# Patient Record
Sex: Female | Born: 1990 | Race: Black or African American | Hispanic: No | Marital: Single | Smoking: Never smoker
Health system: Southern US, Community
[De-identification: ages and names within clinical notes are randomized; demographics above are authoritative.]

## PROBLEM LIST (undated history)

## (undated) DIAGNOSIS — D649 Anemia, unspecified: Secondary | ICD-10-CM

## (undated) DIAGNOSIS — R112 Nausea with vomiting, unspecified: Secondary | ICD-10-CM

## (undated) DIAGNOSIS — Z9889 Other specified postprocedural states: Secondary | ICD-10-CM

## (undated) DIAGNOSIS — F70 Mild intellectual disabilities: Secondary | ICD-10-CM

## (undated) DIAGNOSIS — F32A Depression, unspecified: Secondary | ICD-10-CM

## (undated) DIAGNOSIS — R42 Dizziness and giddiness: Secondary | ICD-10-CM

## (undated) DIAGNOSIS — B001 Herpesviral vesicular dermatitis: Secondary | ICD-10-CM

## (undated) DIAGNOSIS — F329 Major depressive disorder, single episode, unspecified: Secondary | ICD-10-CM

---
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
## 2009-03-17 DIAGNOSIS — F70 Mild intellectual disabilities: Secondary | ICD-10-CM | POA: Insufficient documentation

## 2009-12-10 ENCOUNTER — Emergency Department: Payer: Self-pay | Admitting: Unknown Physician Specialty

## 2010-07-04 DIAGNOSIS — F329 Major depressive disorder, single episode, unspecified: Secondary | ICD-10-CM | POA: Insufficient documentation

## 2011-02-16 ENCOUNTER — Inpatient Hospital Stay: Payer: Self-pay | Admitting: Psychiatry

## 2011-04-21 ENCOUNTER — Ambulatory Visit: Payer: Self-pay | Admitting: Internal Medicine

## 2014-03-20 ENCOUNTER — Emergency Department: Payer: Self-pay | Admitting: Emergency Medicine

## 2014-03-20 LAB — COMPREHENSIVE METABOLIC PANEL
ANION GAP: 7 (ref 7–16)
AST: 20 U/L (ref 15–37)
Albumin: 3.6 g/dL (ref 3.4–5.0)
Alkaline Phosphatase: 55 U/L
BUN: 13 mg/dL (ref 7–18)
Bilirubin,Total: 0.5 mg/dL (ref 0.2–1.0)
CHLORIDE: 106 mmol/L (ref 98–107)
CREATININE: 0.74 mg/dL (ref 0.60–1.30)
Calcium, Total: 8.9 mg/dL (ref 8.5–10.1)
Co2: 23 mmol/L (ref 21–32)
EGFR (African American): >60
GLUCOSE: 118 mg/dL — AB (ref 65–99)
OSMOLALITY: 273 (ref 275–301)
POTASSIUM: 4.3 mmol/L (ref 3.5–5.1)
SGPT (ALT): 15 U/L (ref 12–78)
SODIUM: 136 mmol/L (ref 136–145)
Total Protein: 7.7 g/dL (ref 6.4–8.2)

## 2014-03-20 LAB — CBC WITH DIFFERENTIAL/PLATELET
BASOS PCT: 0.3 %
Basophil #: 0 10*3/uL (ref 0.0–0.1)
EOS ABS: 0.3 10*3/uL (ref 0.0–0.7)
Eosinophil %: 2.7 %
HCT: 34.7 % — AB (ref 35.0–47.0)
HGB: 11.6 g/dL — ABNORMAL LOW (ref 12.0–16.0)
LYMPHS ABS: 3 10*3/uL (ref 1.0–3.6)
Lymphocyte %: 23.6 %
MCH: 27 pg (ref 26.0–34.0)
MCHC: 33.3 g/dL (ref 32.0–36.0)
MCV: 81 fL (ref 80–100)
Monocyte #: 0.7 x10 3/mm (ref 0.2–0.9)
Monocyte %: 5.8 %
NEUTROS ABS: 8.7 10*3/uL — AB (ref 1.4–6.5)
NEUTROS PCT: 67.6 %
PLATELETS: 313 10*3/uL (ref 150–440)
RBC: 4.29 10*6/uL (ref 3.80–5.20)
RDW: 13 % (ref 11.5–14.5)
WBC: 12.8 10*3/uL — ABNORMAL HIGH (ref 3.6–11.0)

## 2014-03-20 LAB — URINALYSIS, COMPLETE
Bilirubin,UR: NEGATIVE
Blood: NEGATIVE
GLUCOSE, UR: NEGATIVE mg/dL (ref 0–75)
KETONE: NEGATIVE
NITRITE: NEGATIVE
Ph: 5 (ref 4.5–8.0)
Protein: NEGATIVE
RBC, UR: NONE SEEN /HPF (ref 0–5)
Specific Gravity: 1.028 (ref 1.003–1.030)

## 2014-03-20 LAB — WET PREP, GENITAL

## 2014-03-20 LAB — GC/CHLAMYDIA PROBE AMP

## 2014-03-20 LAB — LIPASE, BLOOD: LIPASE: 110 U/L (ref 73–393)

## 2014-03-20 LAB — HCG, QUANTITATIVE, PREGNANCY: Beta Hcg, Quant.: 65816 m[IU]/mL — ABNORMAL HIGH

## 2014-03-22 LAB — URINE CULTURE

## 2014-03-31 DIAGNOSIS — B009 Herpesviral infection, unspecified: Secondary | ICD-10-CM | POA: Insufficient documentation

## 2015-06-20 ENCOUNTER — Emergency Department
Admission: EM | Admit: 2015-06-20 | Discharge: 2015-06-20 | Disposition: A | Discharge status: Home / Self Care | Payer: Medicare Other | Attending: Student | Admitting: Student

## 2015-06-20 ENCOUNTER — Encounter: Payer: Self-pay | Admitting: Emergency Medicine

## 2015-06-20 DIAGNOSIS — M545 Low back pain: Secondary | ICD-10-CM | POA: Diagnosis present

## 2015-06-20 DIAGNOSIS — N3001 Acute cystitis with hematuria: Secondary | ICD-10-CM | POA: Diagnosis not present

## 2015-06-20 HISTORY — DX: Mild intellectual disabilities: F70

## 2015-06-20 HISTORY — DX: Herpesviral vesicular dermatitis: B00.1

## 2015-06-20 LAB — URINALYSIS COMPLETE WITH MICROSCOPIC (ARMC ONLY)
Bilirubin Urine: NEGATIVE
Glucose, UA: NEGATIVE mg/dL
KETONES UR: NEGATIVE mg/dL
NITRITE: NEGATIVE
PH: 6 (ref 5.0–8.0)
PROTEIN: 30 mg/dL — AB
Specific Gravity, Urine: 1.018 (ref 1.005–1.030)

## 2015-06-20 MED ORDER — SULFAMETHOXAZOLE-TRIMETHOPRIM 800-160 MG PO TABS: 1.0000 | ORAL_TABLET | Freq: Two times a day (BID) | ORAL | Status: DC

## 2015-06-20 MED ORDER — DIAZEPAM 5 MG PO TABS
5.0000 mg | ORAL_TABLET | Freq: Once | ORAL | Status: AC
  Administered 2015-06-20: 5 mg via ORAL
  Filled 2015-06-20: qty 1

## 2015-06-20 MED ORDER — NAPROXEN 500 MG PO TBEC: 500.0000 mg | DELAYED_RELEASE_TABLET | Freq: Two times a day (BID) | ORAL | Status: DC

## 2015-06-20 MED ORDER — KETOROLAC TROMETHAMINE 60 MG/2ML IM SOLN
60.0000 mg | Freq: Once | INTRAMUSCULAR | Status: AC
  Administered 2015-06-20: 60 mg via INTRAMUSCULAR
  Filled 2015-06-20: qty 2

## 2015-06-20 MED ORDER — CYCLOBENZAPRINE HCL 5 MG PO TABS: 5.0000 mg | ORAL_TABLET | Freq: Three times a day (TID) | ORAL | Status: DC | PRN

## 2015-06-20 NOTE — Discharge Instructions (Signed)

## 2015-06-20 NOTE — ED Notes (Signed)
Per pt she has had lower back pain for about  3 weeks. Also states she has been on her period for "awhile" . She usually has painful periods. Min relief with ibuprofen. Also states she is having urinary freq and pain

## 2015-06-20 NOTE — ED Provider Notes (Signed)
[facility] Emergency Department Provider Note  ____________________________________________  Time seen: Approximately 4:30 PM  I have reviewed the triage vital signs and the nursing notes.   HISTORY  Chief Complaint Back Pain    HPI [name] is a 24 y.o. female who presents for evaluation of lower back pain for 3 weeks. Patient states no relief with ibuprofen. Also states she's having some urinary frequency and pain. Recently finished her menses.   Past Medical History  Diagnosis Date  . Mild mental retardation   . Herpes labialis     There are no active problems to display for this patient.   History reviewed. No pertinent past surgical history.  Current Outpatient Rx  Name  Route  Sig  Dispense  Refill  . cyclobenzaprine (FLEXERIL) 5 MG tablet   Oral   Take 1 tablet (5 mg total) by mouth every 8 (eight) hours as needed for muscle spasms.   30 tablet   0   . naproxen (EC NAPROSYN) 500 MG EC tablet   Oral   Take 1 tablet (500 mg total) by mouth 2 (two) times daily with a meal.   60 tablet   0   . sulfamethoxazole-trimethoprim (BACTRIM DS,SEPTRA DS) 800-160 MG tablet   Oral   Take 1 tablet by mouth 2 (two) times daily.   20 tablet   0     Allergies Review of patient's allergies indicates no known allergies.  History reviewed. No pertinent family history.  Social History Social History  Substance Use Topics  . Smoking status: Never Smoker   . Smokeless tobacco: None  . Alcohol Use: No    Review of Systems Constitutional: No fever/chills Eyes: No visual changes. ENT: No sore throat. Cardiovascular: Denies chest pain. Respiratory: Denies shortness of breath. Gastrointestinal: No abdominal pain.  No nausea, no vomiting.  No diarrhea.  No constipation. Genitourinary: Positive for dysuria. Musculoskeletal: Positive for low back pain Skin: Negative for rash. Neurological: Negative for headaches, focal weakness or  numbness.  10-point ROS otherwise negative.  ____________________________________________   PHYSICAL EXAM:  VITAL SIGNS: ED Triage Vitals  Enc Vitals Group     BP 06/20/15 1608 109/73 mmHg     Pulse Rate 06/20/15 1608 94     Resp 06/20/15 1608 20     Temp 06/20/15 1608 99.5 F (37.5 C)     Temp Source 06/20/15 1608 Oral     SpO2 06/20/15 1608 99 %     Weight 06/20/15 1608 149 lb (67.586 kg)     Height 06/20/15 1608  (1.626 m)     Head Cir --      Peak Flow --      Pain Score 06/20/15 1609 7     Pain Loc --      Pain Edu? --      Excl. in GC? --     Constitutional: Alert and oriented. Well appearing and in no acute distress. Eyes: Conjunctivae are normal. PERRL. EOMI. Head: Atraumatic. Nose: No congestion/rhinnorhea. Mouth/Throat: Mucous membranes are moist.  Oropharynx non-erythematous. Neck: No stridor.   Cardiovascular: Normal rate, regular rhythm. Grossly normal heart sounds.  Good peripheral circulation. Respiratory: Normal respiratory effort.  No retractions. Lungs CTAB. Gastrointestinal: Soft and nontender. No distention. No abdominal bruits. No CVA tenderness. Musculoskeletal: Positive lumbar tenderness as well as paralumbar spine tenderness. Straight leg raise negative bilaterally. Neurologic:  Normal speech and language. No gross focal neurologic deficits are appreciated. No gait instability. Skin:  Skin is warm, dry and intact. No rash noted. Psychiatric: Mood and affect are normal. Speech and behavior are normal.  ____________________________________________   LABS (all labs ordered are listed, but only abnormal results are displayed)  Labs Reviewed  URINALYSIS COMPLETEWITH MICROSCOPIC ([facility] ONLY) - Abnormal; Notable for the following:    Color, Urine YELLOW (*)    APPearance HAZY (*)    Hgb urine dipstick 2+ (*)    Protein, ur 30 (*)    Leukocytes, UA 2+ (*)    Bacteria, UA FEW (*)    Squamous Epithelial / LPF 0-5 (*)    All other components  within normal limits     PROCEDURES  Procedure(s) performed: None  Critical Care performed: No  ____________________________________________   INITIAL IMPRESSION / ASSESSMENT AND PLAN / ED COURSE  Pertinent labs & imaging results that were available during my care of the patient were reviewed by me and considered in my medical decision making (see chart for details).  Acute urinary tract infection. Rx given for Bactrim DS twice a day #20, Flexeril 5 mg 3 times a day and naproxen 500 mg twice a day as needed for back pain. Patient follow-up with PCP or return to the ER as needed. Patient voices no other emergency medical complaints at this time. ____________________________________________   FINAL CLINICAL IMPRESSION(S) / ED DIAGNOSES  Final diagnoses:  Acute cystitis with hematuria       Evangeline Dakin, PA-C 06/20/15 1810  Gayla Doss, MD 06/20/15 2034

## 2015-06-20 NOTE — ED Notes (Signed)
Brought in via [facility] from md's office   Having lower back pain and abd cramping. States pain started about 3 weeks ago .Marland Kitchennow is worse. Also has had fever today

## 2015-10-05 ENCOUNTER — Emergency Department
Admission: EM | Admit: 2015-10-05 | Discharge: 2015-10-05 | Disposition: A | Discharge status: Home / Self Care | Payer: Medicare Other | Attending: Emergency Medicine | Admitting: Emergency Medicine

## 2015-10-05 DIAGNOSIS — Z791 Long term (current) use of non-steroidal anti-inflammatories (NSAID): Secondary | ICD-10-CM | POA: Insufficient documentation

## 2015-10-05 DIAGNOSIS — R1084 Generalized abdominal pain: Secondary | ICD-10-CM | POA: Diagnosis not present

## 2015-10-05 DIAGNOSIS — Z3202 Encounter for pregnancy test, result negative: Secondary | ICD-10-CM | POA: Diagnosis not present

## 2015-10-05 DIAGNOSIS — Z792 Long term (current) use of antibiotics: Secondary | ICD-10-CM | POA: Diagnosis not present

## 2015-10-05 DIAGNOSIS — R109 Unspecified abdominal pain: Secondary | ICD-10-CM | POA: Diagnosis present

## 2015-10-05 LAB — COMPREHENSIVE METABOLIC PANEL
ALT: 13 U/L — ABNORMAL LOW (ref 14–54)
AST: 17 U/L (ref 15–41)
Albumin: 4.1 g/dL (ref 3.5–5.0)
Alkaline Phosphatase: 62 U/L (ref 38–126)
Anion gap: 8 (ref 5–15)
BUN: 20 mg/dL (ref 6–20)
CHLORIDE: 106 mmol/L (ref 101–111)
CO2: 26 mmol/L (ref 22–32)
Calcium: 9.7 mg/dL (ref 8.9–10.3)
Creatinine, Ser: 0.76 mg/dL (ref 0.44–1.00)
GFR calc Af Amer: >60 mL/min (ref >60)
Glucose, Bld: 98 mg/dL (ref 65–99)
Potassium: 4.1 mmol/L (ref 3.5–5.1)
Sodium: 140 mmol/L (ref 135–145)
Total Bilirubin: 0.6 mg/dL (ref 0.3–1.2)
Total Protein: 7.8 g/dL (ref 6.5–8.1)

## 2015-10-05 LAB — CBC WITH DIFFERENTIAL/PLATELET
BASOS ABS: 0 10*3/uL (ref 0–0.1)
BASOS PCT: 1 %
Eosinophils Absolute: 0.4 10*3/uL (ref 0–0.7)
Eosinophils Relative: 5 %
HEMATOCRIT: 36.1 % (ref 35.0–47.0)
HEMOGLOBIN: 11.8 g/dL — AB (ref 12.0–16.0)
Lymphocytes Relative: 29 %
Lymphs Abs: 2.7 10*3/uL (ref 1.0–3.6)
MCH: 26 pg (ref 26.0–34.0)
MCHC: 32.5 g/dL (ref 32.0–36.0)
MCV: 79.9 fL — ABNORMAL LOW (ref 80.0–100.0)
MONO ABS: 0.5 10*3/uL (ref 0.2–0.9)
Monocytes Relative: 6 %
NEUTROS ABS: 5.5 10*3/uL (ref 1.4–6.5)
NEUTROS PCT: 59 %
Platelets: 388 10*3/uL (ref 150–440)
RBC: 4.52 MIL/uL (ref 3.80–5.20)
RDW: 13.3 % (ref 11.5–14.5)
WBC: 9.1 10*3/uL (ref 3.6–11.0)

## 2015-10-05 LAB — URINALYSIS COMPLETE WITH MICROSCOPIC (ARMC ONLY)
BACTERIA UA: NONE SEEN
BILIRUBIN URINE: NEGATIVE
Glucose, UA: NEGATIVE mg/dL
HGB URINE DIPSTICK: NEGATIVE
Ketones, ur: NEGATIVE mg/dL
Nitrite: NEGATIVE
PH: 5 (ref 5.0–8.0)
Protein, ur: NEGATIVE mg/dL
SPECIFIC GRAVITY, URINE: 1.027 (ref 1.005–1.030)

## 2015-10-05 LAB — LIPASE, BLOOD: LIPASE: 35 U/L (ref 11–51)

## 2015-10-05 LAB — POCT PREGNANCY, URINE: PREG TEST UR: NEGATIVE

## 2015-10-05 MED ORDER — DICYCLOMINE HCL 20 MG PO TABS: 20.0000 mg | ORAL_TABLET | Freq: Three times a day (TID) | ORAL | Status: DC | PRN

## 2015-10-05 MED ORDER — SUCRALFATE 1 G PO TABS: 1.0000 g | ORAL_TABLET | Freq: Four times a day (QID) | ORAL | Status: DC

## 2015-10-05 NOTE — ED Provider Notes (Signed)
[facility] Emergency Department Provider Note    ____________________________________________  Time seen: 2200  I have reviewed the triage vital signs and the nursing notes.   HISTORY  Chief Complaint Abdominal Pain   History limited by: Not Limited   HPI ANAI LIPSON is a 25 y.o. female who presents to the emergency department because of abdominal pain. It is located throughout her abdomen. It started 2 weeks ago. It has been constant. It does somewhat wax and wane. She describes it as both cramping and sharp. It will become severe at times. She has not noticed any modifying factors, she does not nose any change with eating or defecation. The patient denies similar symptoms in the past. She denies any fevers. Denies any vomiting. Denies any diarrhea.    Past Medical History  Diagnosis Date  . Mild mental retardation   . Herpes labialis     There are no active problems to display for this patient.   No past surgical history on file.  Current Outpatient Rx  Name  Route  Sig  Dispense  Refill  . cyclobenzaprine (FLEXERIL) 5 MG tablet   Oral   Take 1 tablet (5 mg total) by mouth every 8 (eight) hours as needed for muscle spasms.   30 tablet   0   . naproxen (EC NAPROSYN) 500 MG EC tablet   Oral   Take 1 tablet (500 mg total) by mouth 2 (two) times daily with a meal.   60 tablet   0   . sulfamethoxazole-trimethoprim (BACTRIM DS,SEPTRA DS) 800-160 MG tablet   Oral   Take 1 tablet by mouth 2 (two) times daily.   20 tablet   0     Allergies Review of patient's allergies indicates no known allergies.  No family history on file.  Social History Social History  Substance Use Topics  . Smoking status: Never Smoker   . Smokeless tobacco: Not on file  . Alcohol Use: No    Review of Systems  Constitutional: Negative for fever. Cardiovascular: Negative for chest pain. Respiratory: Negative for shortness of  breath. Gastrointestinal: Positive for generalized abdominal pain. Neurological: Negative for headaches, focal weakness or numbness.   10-point ROS otherwise negative.  ____________________________________________   PHYSICAL EXAM:  VITAL SIGNS: ED Triage Vitals  Enc Vitals Group     BP 10/05/15 2101 128/85 mmHg     Pulse Rate 10/05/15 2101 85     Resp 10/05/15 2101 20     Temp 10/05/15 2101 98.7 F (37.1 C)     Temp Source 10/05/15 2101 Oral     SpO2 10/05/15 2101 100 %     Weight 10/05/15 2101 145 lb (65.772 kg)     Height 10/05/15 2101  (1.626 m)     Head Cir --      Peak Flow --      Pain Score 10/05/15 2107 9   Constitutional: Alert and oriented. Well appearing and in no distress. Eyes: Conjunctivae are normal. PERRL. Normal extraocular movements. ENT   Head: Normocephalic and atraumatic.   Nose: No congestion/rhinnorhea.   Mouth/Throat: Mucous membranes are moist.   Neck: No stridor. Hematological/Lymphatic/Immunilogical: No cervical lymphadenopathy. Cardiovascular: Normal rate, regular rhythm.  No murmurs, rubs, or gallops. Respiratory: Normal respiratory effort without tachypnea nor retractions. Breath sounds are clear and equal bilaterally. No wheezes/rales/rhonchi. Gastrointestinal: Soft and mildly tender to palpation diffusely. No rebound. No guarding. No tympany. Genitourinary: Deferred Musculoskeletal: Normal range of motion in all  extremities. No joint effusions.  No lower extremity tenderness nor edema. Neurologic:  Normal speech and language. No gross focal neurologic deficits are appreciated.  Skin:  Skin is warm, dry and intact. No rash noted. Psychiatric: Mood and affect are normal. Speech and behavior are normal. Patient exhibits appropriate insight and judgment.  ____________________________________________    LABS (pertinent positives/negatives)  Labs Reviewed  CBC WITH DIFFERENTIAL/PLATELET - Abnormal; Notable for the  following:    Hemoglobin 11.8 (*)    MCV 79.9 (*)    All other components within normal limits  COMPREHENSIVE METABOLIC PANEL - Abnormal; Notable for the following:    ALT 13 (*)    All other components within normal limits  URINALYSIS COMPLETEWITH MICROSCOPIC ([facility] ONLY) - Abnormal; Notable for the following:    Color, Urine YELLOW (*)    APPearance HAZY (*)    Leukocytes, UA 1+ (*)    Squamous Epithelial / LPF 0-5 (*)    All other components within normal limits  LIPASE, BLOOD  POCT PREGNANCY, URINE  POC URINE PREG, ED     ____________________________________________   EKG  None  ____________________________________________    RADIOLOGY  None   ____________________________________________   PROCEDURES  Procedure(s) performed: None  Critical Care performed: No  ____________________________________________   INITIAL IMPRESSION / ASSESSMENT AND PLAN / ED COURSE  Pertinent labs & imaging results that were available during my care of the patient were reviewed by me and considered in my medical decision making (see chart for details).  Patient presented to the emergency department today because of concerns for generalized abdominal pain. Blood work without any concerning results. Urine without any concerning results. On my exam patient is mildly and diffusely tender to palpation in the abdomen without any concerning signs of rebound guarding or rigidity. This point I doubt a severe intra-abdominal infection. Unclear etiology at this point although I do think patient would benefit from a trial of oral medications. I did discuss return precautions with patient.  ____________________________________________   FINAL CLINICAL IMPRESSION(S) / ED DIAGNOSES  Final diagnoses:  Generalized abdominal pain     Phineas Semen, MD 10/05/15 2251

## 2015-10-05 NOTE — Discharge Instructions (Signed)
Please seek medical attention for any high fevers, chest pain, shortness of breath, change in behavior, persistent vomiting, bloody stool or any other new or concerning symptoms. ° ° °Abdominal Pain, Adult °Many things can cause abdominal pain. Usually, abdominal pain is not caused by a disease and will improve without treatment. It can often be observed and treated at home. Your health care provider will do a physical exam and possibly order blood tests and X-rays to help determine the seriousness of your pain. However, in many cases, more time must pass before a clear cause of the pain can be found. Before that point, your health care provider may not know if you need more testing or further treatment. °HOME CARE INSTRUCTIONS °Monitor your abdominal pain for any changes. The following actions may help to alleviate any discomfort you are experiencing: °· Only take over-the-counter or prescription medicines as directed by your health care provider. °· Do not take laxatives unless directed to do so by your health care provider. °· Try a clear liquid diet (broth, tea, or water) as directed by your health care provider. Slowly move to a bland diet as tolerated. °SEEK MEDICAL CARE IF: °· You have unexplained abdominal pain. °· You have abdominal pain associated with nausea or diarrhea. °· You have pain when you urinate or have a bowel movement. °· You experience abdominal pain that wakes you in the night. °· You have abdominal pain that is worsened or improved by eating food. °· You have abdominal pain that is worsened with eating fatty foods. °· You have a fever. °SEEK IMMEDIATE MEDICAL CARE IF: °· Your pain does not go away within 2 hours. °· You keep throwing up (vomiting). °· Your pain is felt only in portions of the abdomen, such as the right side or the left lower portion of the abdomen. °· You pass bloody or black tarry stools. °MAKE SURE YOU: °· Understand these instructions. °· Will watch your  condition. °· Will get help right away if you are not doing well or get worse. °  °This information is not intended to replace advice given to you by your health care provider. Make sure you discuss any questions you have with your health care provider. °  °Document Released: 06/12/2005 Document Revised: 05/24/2015 Document Reviewed: 05/12/2013 °Elsevier Interactive Patient Education ©2016 [employer] ° °

## 2015-10-05 NOTE — ED Notes (Signed)
Pt in with generalized abd pain x 3 weeks no n.v.d

## 2015-10-05 NOTE — ED Notes (Signed)
Pt given crackers and water. Able to tolerate both.

## 2015-11-27 ENCOUNTER — Inpatient Hospital Stay
Admission: EM | Admit: 2015-11-27 | Discharge: 2015-11-29 | DRG: 872 | Disposition: A | Discharge status: Home / Self Care | Payer: Medicare Other | Attending: Internal Medicine | Admitting: Internal Medicine

## 2015-11-27 ENCOUNTER — Encounter: Payer: Self-pay | Admitting: Medical Oncology

## 2015-11-27 DIAGNOSIS — B9620 Unspecified Escherichia coli [E. coli] as the cause of diseases classified elsewhere: Secondary | ICD-10-CM | POA: Diagnosis present

## 2015-11-27 DIAGNOSIS — N12 Tubulo-interstitial nephritis, not specified as acute or chronic: Secondary | ICD-10-CM | POA: Diagnosis present

## 2015-11-27 DIAGNOSIS — E876 Hypokalemia: Secondary | ICD-10-CM | POA: Diagnosis present

## 2015-11-27 DIAGNOSIS — R000 Tachycardia, unspecified: Secondary | ICD-10-CM | POA: Diagnosis present

## 2015-11-27 DIAGNOSIS — F70 Mild intellectual disabilities: Secondary | ICD-10-CM | POA: Diagnosis present

## 2015-11-27 DIAGNOSIS — N179 Acute kidney failure, unspecified: Secondary | ICD-10-CM | POA: Diagnosis present

## 2015-11-27 DIAGNOSIS — A419 Sepsis, unspecified organism: Secondary | ICD-10-CM | POA: Diagnosis present

## 2015-11-27 DIAGNOSIS — R1011 Right upper quadrant pain: Secondary | ICD-10-CM

## 2015-11-27 DIAGNOSIS — Z79899 Other long term (current) drug therapy: Secondary | ICD-10-CM

## 2015-11-27 LAB — CBC
HEMATOCRIT: 33.2 % — AB (ref 35.0–47.0)
HEMOGLOBIN: 11 g/dL — AB (ref 12.0–16.0)
MCH: 26.2 pg (ref 26.0–34.0)
MCHC: 33.2 g/dL (ref 32.0–36.0)
MCV: 79 fL — ABNORMAL LOW (ref 80.0–100.0)
Platelets: 249 10*3/uL (ref 150–440)
RBC: 4.2 MIL/uL (ref 3.80–5.20)
RDW: 13.4 % (ref 11.5–14.5)
WBC: 19.5 10*3/uL — ABNORMAL HIGH (ref 3.6–11.0)

## 2015-11-27 LAB — URINALYSIS COMPLETE WITH MICROSCOPIC (ARMC ONLY)
BACTERIA UA: NONE SEEN
Bilirubin Urine: NEGATIVE
Glucose, UA: NEGATIVE mg/dL
Ketones, ur: NEGATIVE mg/dL
Nitrite: POSITIVE — AB
PH: 6 (ref 5.0–8.0)
PROTEIN: 100 mg/dL — AB
Specific Gravity, Urine: 1.014 (ref 1.005–1.030)

## 2015-11-27 LAB — BASIC METABOLIC PANEL
ANION GAP: 10 (ref 5–15)
BUN: 13 mg/dL (ref 6–20)
CALCIUM: 8.4 mg/dL — AB (ref 8.9–10.3)
CO2: 25 mmol/L (ref 22–32)
Chloride: 100 mmol/L — ABNORMAL LOW (ref 101–111)
Creatinine, Ser: 1.15 mg/dL — ABNORMAL HIGH (ref 0.44–1.00)
GFR calc Af Amer: >60 mL/min (ref >60)
GFR calc non Af Amer: >60 mL/min (ref >60)
GLUCOSE: 128 mg/dL — AB (ref 65–99)
Potassium: 3.3 mmol/L — ABNORMAL LOW (ref 3.5–5.1)
Sodium: 135 mmol/L (ref 135–145)

## 2015-11-27 LAB — GLUCOSE, CAPILLARY: GLUCOSE-CAPILLARY: 98 mg/dL (ref 65–99)

## 2015-11-27 LAB — LACTIC ACID, PLASMA: LACTIC ACID, VENOUS: 1.8 mmol/L (ref 0.5–2.0)

## 2015-11-27 LAB — POCT PREGNANCY, URINE: Preg Test, Ur: NEGATIVE

## 2015-11-27 MED ORDER — DEXTROSE 5 % IV SOLN
1.0000 g | Freq: Once | INTRAVENOUS | Status: AC
  Administered 2015-11-27: 1 g via INTRAVENOUS
  Filled 2015-11-27 (×2): qty 10

## 2015-11-27 MED ORDER — ACETAMINOPHEN 325 MG PO TABS
ORAL_TABLET | ORAL | Status: AC
  Filled 2015-11-27: qty 2

## 2015-11-27 MED ORDER — SODIUM CHLORIDE 0.9 % IV SOLN
INTRAVENOUS | Status: AC
  Administered 2015-11-27 – 2015-11-28 (×4): via INTRAVENOUS

## 2015-11-27 MED ORDER — SODIUM CHLORIDE 0.9 % IV BOLUS (SEPSIS)
1000.0000 mL | Freq: Once | INTRAVENOUS | Status: AC
  Administered 2015-11-27: 1000 mL via INTRAVENOUS

## 2015-11-27 MED ORDER — SODIUM CHLORIDE 0.9 % IV BOLUS (SEPSIS)
1000.0000 mL | Freq: Once | INTRAVENOUS | Status: AC
Start: 2015-11-27 — End: 2015-11-27
  Administered 2015-11-27: 1000 mL via INTRAVENOUS

## 2015-11-27 MED ORDER — DEXTROSE 5 % IV SOLN
1.0000 g | INTRAVENOUS | Status: DC
  Administered 2015-11-28 – 2015-11-29 (×2): 1 g via INTRAVENOUS
  Filled 2015-11-27 (×3): qty 10

## 2015-11-27 MED ORDER — IBUPROFEN 600 MG PO TABS
ORAL_TABLET | ORAL | Status: AC
  Filled 2015-11-27: qty 1

## 2015-11-27 MED ORDER — ONDANSETRON HCL 4 MG PO TABS: 4.0000 mg | ORAL_TABLET | Freq: Four times a day (QID) | ORAL | Status: DC | PRN

## 2015-11-27 MED ORDER — ACETAMINOPHEN 650 MG RE SUPP
650.0000 mg | Freq: Four times a day (QID) | RECTAL | Status: DC | PRN
Start: 2015-11-27 — End: 2015-11-29

## 2015-11-27 MED ORDER — IBUPROFEN 100 MG/5ML PO SUSP
200.0000 mg | Freq: Once | ORAL | Status: AC
  Administered 2015-11-28: 200 mg via ORAL
  Filled 2015-11-27 (×3): qty 10

## 2015-11-27 MED ORDER — IBUPROFEN 400 MG PO TABS
600.0000 mg | ORAL_TABLET | Freq: Once | ORAL | Status: AC
  Administered 2015-11-27: 600 mg via ORAL

## 2015-11-27 MED ORDER — SODIUM CHLORIDE 0.9% FLUSH
3.0000 mL | Freq: Two times a day (BID) | INTRAVENOUS | Status: DC
  Administered 2015-11-27 – 2015-11-28 (×2): 3 mL via INTRAVENOUS

## 2015-11-27 MED ORDER — ACETAMINOPHEN 325 MG PO TABS
650.0000 mg | ORAL_TABLET | Freq: Once | ORAL | Status: AC
  Administered 2015-11-27: 650 mg via ORAL

## 2015-11-27 MED ORDER — ENOXAPARIN SODIUM 40 MG/0.4ML ~~LOC~~ SOLN
40.0000 mg | SUBCUTANEOUS | Status: DC
  Administered 2015-11-28: 40 mg via SUBCUTANEOUS
  Filled 2015-11-27 (×2): qty 0.4

## 2015-11-27 MED ORDER — ACETAMINOPHEN 325 MG PO TABS
650.0000 mg | ORAL_TABLET | Freq: Four times a day (QID) | ORAL | Status: DC | PRN
Start: 2015-11-27 — End: 2015-11-29

## 2015-11-27 MED ORDER — ONDANSETRON HCL 4 MG/2ML IJ SOLN
4.0000 mg | Freq: Four times a day (QID) | INTRAMUSCULAR | Status: DC | PRN
  Administered 2015-11-28: 4 mg via INTRAVENOUS
  Filled 2015-11-27 (×2): qty 2

## 2015-11-27 NOTE — ED Provider Notes (Signed)
[facility] Emergency Department Provider Note  ____________________________________________  Time seen: Approximately 530 PM  I have reviewed the triage vital signs and the nursing notes.   HISTORY  Chief Complaint Back Pain; Dysuria; and Fever    HPI [name] is a 25 y.o. female with a history of mild mental retardation who is presenting to the emergency department today with urinary burning and frequency as well as back pain over the past week. She also reports fever. Also reporting suprapubic abdominal pain.   Past Medical History  Diagnosis Date  . Mild mental retardation   . Herpes labialis     There are no active problems to display for this patient.   History reviewed. No pertinent past surgical history.  Current Outpatient Rx  Name  Route  Sig  Dispense  Refill  . cyclobenzaprine (FLEXERIL) 5 MG tablet   Oral   Take 1 tablet (5 mg total) by mouth every 8 (eight) hours as needed for muscle spasms.   30 tablet   0   . dicyclomine (BENTYL) 20 MG tablet   Oral   Take 1 tablet (20 mg total) by mouth 3 (three) times daily as needed for spasms.   30 tablet   0   . naproxen (EC NAPROSYN) 500 MG EC tablet   Oral   Take 1 tablet (500 mg total) by mouth 2 (two) times daily with a meal.   60 tablet   0   . sucralfate (CARAFATE) 1 g tablet   Oral   Take 1 tablet (1 g total) by mouth 4 (four) times daily.   60 tablet   0   . sulfamethoxazole-trimethoprim (BACTRIM DS,SEPTRA DS) 800-160 MG tablet   Oral   Take 1 tablet by mouth 2 (two) times daily.   20 tablet   0     Allergies Review of patient's allergies indicates no known allergies.  No family history on file.  Social History Social History  Substance Use Topics  . Smoking status: Never Smoker   . Smokeless tobacco: None  . Alcohol Use: No    Review of Systems Constitutional: fever/chills Eyes: No visual changes. ENT: No sore throat. Cardiovascular: Denies  chest pain. Respiratory: Denies shortness of breath. Gastrointestinal: No nausea, no vomiting.  No diarrhea.  No constipation. Genitourinary: Negative for dysuria. Musculoskeletal: Back pain across her lower back. Skin: Negative for rash. Neurological: Negative for headaches, focal weakness or numbness.  10-point ROS otherwise negative.  ____________________________________________   PHYSICAL EXAM:  VITAL SIGNS: ED Triage Vitals  Enc Vitals Group     BP 11/27/15 1611 103/68 mmHg     Pulse Rate 11/27/15 1611 126     Resp 11/27/15 1611 20     Temp 11/27/15 1611 100.4 F (38 C)     Temp Source 11/27/15 1611 Oral     SpO2 11/27/15 1611 100 %     Weight 11/27/15 1611 145 lb (65.772 kg)     Height 11/27/15 1611  (1.626 m)     Head Cir --      Peak Flow --      Pain Score 11/27/15 1612 10     Pain Loc --      Pain Edu? --      Excl. in GC? --     Constitutional: Alert and oriented. Well appearing and in no acute distress. Eyes: Conjunctivae are normal. PERRL. EOMI. Head: Atraumatic. Nose: No congestion/rhinnorhea. Mouth/Throat: Mucous membranes are moist.  Oropharynx non-erythematous. Neck: No stridor.   Cardiovascular: Normal rate, regular rhythm. Grossly normal heart sounds.  Good peripheral circulation. Respiratory: Normal respiratory effort.  No retractions. Lungs CTAB. Gastrointestinal: Soft with mild suprapubic tenderness palpation. No distention. Bilateral CVA tenderness. Musculoskeletal: No lower extremity tenderness nor edema.  No joint effusions. Neurologic:  Normal speech and language. No gross focal neurologic deficits are appreciated. No gait instability. Skin:  Skin is warm, dry and intact. No rash noted. Psychiatric: Mood and affect are normal. Speech and behavior are normal.  ____________________________________________   LABS (all labs ordered are listed, but only abnormal results are displayed)  Labs Reviewed  BASIC METABOLIC PANEL - Abnormal;  Notable for the following:    Potassium 3.3 (*)    Chloride 100 (*)    Glucose, Bld 128 (*)    Creatinine, Ser 1.15 (*)    Calcium 8.4 (*)    All other components within normal limits  CBC - Abnormal; Notable for the following:    WBC 19.5 (*)    Hemoglobin 11.0 (*)    HCT 33.2 (*)    MCV 79.0 (*)    All other components within normal limits  URINALYSIS COMPLETEWITH MICROSCOPIC ([facility] ONLY) - Abnormal; Notable for the following:    Color, Urine AMBER (*)    APPearance TURBID (*)    Hgb urine dipstick 2+ (*)    Protein, ur 100 (*)    Nitrite POSITIVE (*)    Leukocytes, UA 3+ (*)    Squamous Epithelial / LPF 0-5 (*)    All other components within normal limits  URINE CULTURE  POC URINE PREG, ED  POCT PREGNANCY, URINE   ____________________________________________  EKG  ED ECG REPORT I, Arelia LongestSchaevitz,  Moustafa Mossa M, the attending physician, personally viewed and interpreted this ECG.   Date: 11/27/2015  EKG Time: 1611  Rate: 126  Rhythm: sinus tachycardia  Axis: Normal axis  Intervals:none  ST&T Change: No ST segment elevation or depression. No abnormal T-wave inversion.  ____________________________________________  RADIOLOGY   ____________________________________________   PROCEDURES   ____________________________________________   INITIAL IMPRESSION / ASSESSMENT AND PLAN / ED COURSE  Pertinent labs & imaging results that were available during my care of the patient were reviewed by me and considered in my medical decision making (see chart for details).  Patient with pyelonephritis.  ----------------------------------------- 10:06 PM on 11/27/2015 -----------------------------------------  Patient is persistently tachycardic despite 2 L of fluids ibuprofen and Tylenol. Sepsis alert called. Admitted to Dr. Anne HahnWillis of the medicine service. Patient aware of need for admission. ____________________________________________   FINAL CLINICAL IMPRESSION(S) / ED  DIAGNOSES  Pyelonephritis. Sepsis.    Myrna Blazeravid Matthew Delsin Copen, MD 11/27/15 2206

## 2015-11-27 NOTE — ED Notes (Signed)
Pt reports she began having back pain last week, pt went to PCP and was told to come to er for IV abx d/t kidney infection. Pt reports fever and painful urination too.

## 2015-11-27 NOTE — Progress Notes (Signed)
Pharmacy Antibiotic Note  [name] is a 25 y.o. female admitted on 11/27/2015 with sepsis and pyelonephritis.  Pharmacy has been consulted for ceftriaxone dosing.  Plan: Pt received ceftriaxone 1 g IV x1 in the ED, will continue dosing with ceftraixone 1 g IV q24h.   Height: 5\' 4"  (162.6 cm) Weight: 145 lb (65.772 kg) IBW/kg (Calculated) : 54.7  Temp (24hrs), Avg:100.1 F (37.8 C), Min:99.8 F (37.7 C), Max:100.4 F (38 C)   Recent Labs Lab 11/27/15 1615 11/27/15 2231  WBC 19.5*  --   CREATININE 1.15*  --   LATICACIDVEN  --  1.8    Estimated Creatinine Clearance: 69.8 mL/min (by C-G formula based on Cr of 1.15).    No Known Allergies  Antimicrobials this admission: Ceftriaxone 3/13>>  Dose adjustments this admission:   Microbiology results: 3/13 BCx: sent  3/13 UCx: sent  3/13 MRSA PCR: sent  Thank you for allowing pharmacy to be a part of this patient's care.  Marty HeckWang, Christophor Eick L 11/27/2015 11:34 PM

## 2015-11-27 NOTE — H&P (Signed)
[facility] [place] at [facility]   PATIENT NAME: [name]    MR#:  [id number]  DATE OF BIRTH:  05-29-1991  DATE OF ADMISSION:  11/27/2015  PRIMARY CARE PHYSICIAN: [facility]   REQUESTING/REFERRING PHYSICIAN: Pershing ProudSchaevitz, MD  CHIEF COMPLAINT:   Chief Complaint  Patient presents with  . Back Pain  . Dysuria  . Fever    HISTORY OF PRESENT ILLNESS:  [name]  is a 25 y.o. female who presents with 1 week of back pain and dysuria. Patient states that she first started having some dysuria, which then progressed to some mild lower abdominal pain and then back pain. She states that she has also had fevers and chills at home. Patient has mild MR, and is unable to extrapolate much more on her history. Care in the ED she was found to be tachycardic, febrile, with an elevated white blood cell count. UA strongly suspicious for infection. Hospitals were called for admission for sepsis and pyelonephritis.  PAST MEDICAL HISTORY:   Past Medical History  Diagnosis Date  . Mild mental retardation   . Herpes labialis     PAST SURGICAL HISTORY:   Past Surgical History  Procedure Laterality Date  . Cesarean section      SOCIAL HISTORY:   Social History  Substance Use Topics  . Smoking status: Never Smoker   . Smokeless tobacco: Not on file  . Alcohol Use: No    FAMILY HISTORY:  No family history on file.  DRUG ALLERGIES:  No Known Allergies  MEDICATIONS AT HOME:   Prior to Admission medications   Medication Sig Start Date End Date Taking? Authorizing Provider  cyclobenzaprine (FLEXERIL) 5 MG tablet Take 1 tablet (5 mg total) by mouth every 8 (eight) hours as needed for muscle spasms. 06/20/15   Charmayne Sheerharles M Beers, PA-C  dicyclomine (BENTYL) 20 MG tablet Take 1 tablet (20 mg total) by mouth 3 (three) times daily as needed for spasms. 10/05/15 10/04/16  Phineas SemenGraydon Goodman, MD  naproxen (EC NAPROSYN) 500 MG EC tablet Take 1 tablet (500  mg total) by mouth 2 (two) times daily with a meal. 06/20/15   Charmayne Sheerharles M Beers, PA-C  sucralfate (CARAFATE) 1 g tablet Take 1 tablet (1 g total) by mouth 4 (four) times daily. 10/05/15   Phineas SemenGraydon Goodman, MD  sulfamethoxazole-trimethoprim (BACTRIM DS,SEPTRA DS) 800-160 MG tablet Take 1 tablet by mouth 2 (two) times daily. 06/20/15   Evangeline Dakinharles M Beers, PA-C    REVIEW OF SYSTEMS:  Review of Systems  Constitutional: Positive for fever and chills. Negative for weight loss and malaise/fatigue.  HENT: Negative for ear pain, hearing loss and tinnitus.   Eyes: Negative for blurred vision, double vision, pain and redness.  Respiratory: Negative for cough, hemoptysis and shortness of breath.   Cardiovascular: Negative for chest pain, palpitations, orthopnea and leg swelling.  Gastrointestinal: Negative for nausea, vomiting, abdominal pain, diarrhea and constipation.  Genitourinary: Positive for dysuria. Negative for frequency and hematuria.  Musculoskeletal: Positive for back pain. Negative for joint pain and neck pain.  Skin:       No acne, rash, or lesions  Neurological: Negative for dizziness, tremors, focal weakness and weakness.  Endo/Heme/Allergies: Negative for polydipsia. Does not bruise/bleed easily.  Psychiatric/Behavioral: Negative for depression. The patient is not nervous/anxious and does not have insomnia.      VITAL SIGNS:   Filed Vitals:   11/27/15 1611 11/27/15 1915  BP: 103/68 114/72  Pulse: 126 110  Temp: 100.4 F (38 C) 100 F (37.8 C)  TempSrc: Oral Oral  Resp: 20 19  Height:  (1.626 m)   Weight: 65.772 kg (145 lb)   SpO2: 100% 99%   Wt Readings from Last 3 Encounters:  11/27/15 65.772 kg (145 lb)  10/05/15 65.772 kg (145 lb)  06/20/15 67.586 kg (149 lb)    PHYSICAL EXAMINATION:  Physical Exam  Vitals reviewed. Constitutional: She is oriented to person, place, and time. She appears well-developed and well-nourished. No distress.  HENT:  Head: Normocephalic  and atraumatic.  Dry mucous membranes  Eyes: Conjunctivae and EOM are normal. Pupils are equal, round, and reactive to light. No scleral icterus.  Neck: Normal range of motion. Neck supple. No JVD present. No thyromegaly present.  Cardiovascular: Regular rhythm and intact distal pulses.  Exam reveals no gallop and no friction rub.   No murmur heard. Tachycardic  Respiratory: Effort normal and breath sounds normal. No respiratory distress. She has no wheezes. She has no rales.  GI: Soft. Bowel sounds are normal. She exhibits no distension. There is tenderness (suprapubic).  Musculoskeletal: Normal range of motion. She exhibits tenderness (bilateral CVA tenderness). She exhibits no edema.  No arthritis, no gout  Lymphadenopathy:    She has no cervical adenopathy.  Neurological: She is alert and oriented to person, place, and time. No cranial nerve deficit.  No dysarthria, no aphasia  Skin: Skin is warm and dry. No rash noted. No erythema.  Psychiatric: She has a normal mood and affect. Her behavior is normal. Judgment and thought content normal.    LABORATORY PANEL:   CBC  Recent Labs Lab 11/27/15 1615  WBC 19.5*  HGB 11.0*  HCT 33.2*  PLT 249   ------------------------------------------------------------------------------------------------------------------  Chemistries   Recent Labs Lab 11/27/15 1615  NA 135  K 3.3*  CL 100*  CO2 25  GLUCOSE 128*  BUN 13  CREATININE 1.15*  CALCIUM 8.4*   ------------------------------------------------------------------------------------------------------------------  Cardiac Enzymes No results for input(s): TROPONINI in the last 168 hours. ------------------------------------------------------------------------------------------------------------------  RADIOLOGY:  No results found.  EKG:   Orders placed or performed during the hospital encounter of 11/27/15  . EKG 12-Lead  . EKG 12-Lead    IMPRESSION AND PLAN:   Principal Problem:   Sepsis (HCC) - from UTI. Rocephin started in the ED, continue this on admission. Urine culture sent. Patient is hemodynamically stable. Lactic acid pending. Active Problems:   Pyelonephritis - antibiotics and cultures as above.   AKI (acute kidney injury) (HCC) - likely from above, suspect prerenal component, aggressive fluids for hydration and correction of AKI.  All the records are reviewed and case discussed with ED provider. Management plans discussed with the patient and/or family.  DVT PROPHYLAXIS: SubQ lovenox  GI PROPHYLAXIS: None  ADMISSION STATUS: Inpatient  CODE STATUS: Full Code Status History    This patient does not have a recorded code status. Please follow your organizational policy for patients in this situation.      TOTAL TIME TAKING CARE OF THIS PATIENT: 45 minutes.    Raybon Conard FIELDING 11/27/2015, 10:15 PM  [employer] [facility]  Office  [phone]  CC: Primary care physician; [facility]

## 2015-11-28 ENCOUNTER — Inpatient Hospital Stay: Payer: Medicare Other

## 2015-11-28 LAB — BASIC METABOLIC PANEL
ANION GAP: 7 (ref 5–15)
BUN: 13 mg/dL (ref 6–20)
CHLORIDE: 107 mmol/L (ref 101–111)
CO2: 23 mmol/L (ref 22–32)
CREATININE: 0.92 mg/dL (ref 0.44–1.00)
Calcium: 7.7 mg/dL — ABNORMAL LOW (ref 8.9–10.3)
GFR calc non Af Amer: >60 mL/min (ref >60)
Glucose, Bld: 113 mg/dL — ABNORMAL HIGH (ref 65–99)
POTASSIUM: 3.2 mmol/L — AB (ref 3.5–5.1)
SODIUM: 137 mmol/L (ref 135–145)

## 2015-11-28 LAB — MRSA PCR SCREENING: MRSA by PCR: POSITIVE — AB

## 2015-11-28 LAB — LACTIC ACID, PLASMA: LACTIC ACID, VENOUS: 1.1 mmol/L (ref 0.5–2.0)

## 2015-11-28 LAB — CBC
HEMATOCRIT: 28.9 % — AB (ref 35.0–47.0)
HEMOGLOBIN: 9.5 g/dL — AB (ref 12.0–16.0)
MCH: 26.1 pg (ref 26.0–34.0)
MCHC: 33 g/dL (ref 32.0–36.0)
MCV: 78.9 fL — AB (ref 80.0–100.0)
PLATELETS: 225 10*3/uL (ref 150–440)
RBC: 3.66 MIL/uL — AB (ref 3.80–5.20)
RDW: 13.5 % (ref 11.5–14.5)
WBC: 19.2 10*3/uL — AB (ref 3.6–11.0)

## 2015-11-28 MED ORDER — MORPHINE SULFATE (PF) 2 MG/ML IV SOLN: 2.0000 mg | INTRAVENOUS | Status: DC | PRN

## 2015-11-28 MED ORDER — MUPIROCIN 2 % EX OINT
1.0000 "application " | TOPICAL_OINTMENT | Freq: Two times a day (BID) | CUTANEOUS | Status: DC
  Administered 2015-11-28 – 2015-11-29 (×3): 1 via NASAL
  Filled 2015-11-28 (×2): qty 22

## 2015-11-28 MED ORDER — POTASSIUM CHLORIDE 10 MEQ/100ML IV SOLN
10.0000 meq | INTRAVENOUS | Status: AC
  Administered 2015-11-28 (×4): 10 meq via INTRAVENOUS
  Filled 2015-11-28 (×4): qty 100

## 2015-11-28 MED ORDER — SODIUM CHLORIDE 0.9 % IV BOLUS (SEPSIS)
1000.0000 mL | Freq: Once | INTRAVENOUS | Status: AC
  Administered 2015-11-28: 1000 mL via INTRAVENOUS

## 2015-11-28 MED ORDER — CALCIUM CARBONATE ANTACID 500 MG PO CHEW
1.0000 | CHEWABLE_TABLET | Freq: Three times a day (TID) | ORAL | Status: DC
  Administered 2015-11-28 – 2015-11-29 (×4): 200 mg via ORAL
  Filled 2015-11-28 (×4): qty 1

## 2015-11-28 MED ORDER — CHLORHEXIDINE GLUCONATE CLOTH 2 % EX PADS
6.0000 | MEDICATED_PAD | Freq: Every day | CUTANEOUS | Status: DC
  Administered 2015-11-28 – 2015-11-29 (×2): 6 via TOPICAL

## 2015-11-28 MED ORDER — IBUPROFEN 100 MG/5ML PO SUSP
400.0000 mg | Freq: Four times a day (QID) | ORAL | Status: DC | PRN
  Administered 2015-11-28 (×3): 400 mg via ORAL
  Filled 2015-11-28 (×5): qty 20

## 2015-11-28 NOTE — Progress Notes (Signed)
eLink Physician-Brief Progress Note Patient Name: Shelley MoreSherica S Rolf DOB: 11-22-1990 MRN: [id number]   Date of Service  11/28/2015  HPI/Events of Note  Admitted from ER UTI, sepsis AKI? Stable on camera check  eICU Interventions  No eICU intervention     Intervention Category Evaluation Type: New Patient Evaluation  [name] 11/28/2015, 12:14 AM

## 2015-11-28 NOTE — Progress Notes (Signed)
[facility] - [place] at [facility]   PATIENT NAME: [name]    MR#:  [id number]  DATE OF BIRTH:  14-Jun-1991  SUBJECTIVE:  Patient admitted yesterday, febrile early this morning Sleeping but arousable-no complaints  REVIEW OF SYSTEMS:  CONSTITUTIONAL: Positive fever, fatigue or weakness.  EYES: No blurred or double vision.  EARS, NOSE, AND THROAT: No tinnitus or ear pain.  RESPIRATORY: No cough, shortness of breath, wheezing or hemoptysis.  CARDIOVASCULAR: No chest pain, orthopnea, edema.  GASTROINTESTINAL: No nausea, vomiting, diarrhea or abdominal pain.  GENITOURINARY: No dysuria, hematuria.  ENDOCRINE: No polyuria, nocturia,  HEMATOLOGY: No anemia, easy bruising or bleeding SKIN: No rash or lesion. MUSCULOSKELETAL: No joint pain or arthritis.   NEUROLOGIC: No tingling, numbness, weakness.  PSYCHIATRY: No anxiety or depression.   DRUG ALLERGIES:  No Known Allergies  VITALS:  Blood pressure 120/71, pulse 119, temperature 99 F (37.2 C), temperature source Oral, resp. rate 24, height  (1.626 m), weight 65.772 kg (145 lb), SpO2 100 %.  PHYSICAL EXAMINATION:  VITAL SIGNS: Filed Vitals:   11/28/15 1100 11/28/15 1112  BP: 120/71   Pulse: 119   Temp:  99 F (37.2 C)  Resp: 24    GENERAL:24 y.o.female currently in no acute distress.  HEAD: Normocephalic, atraumatic.  EYES: Pupils equal, round, reactive to light. Extraocular muscles intact. No scleral icterus.  MOUTH: Moist mucosal membrane. Dentition intact. No abscess noted.  EAR, NOSE, THROAT: Clear without exudates. No external lesions.  NECK: Supple. No thyromegaly. No nodules. No JVD.  PULMONARY: Clear to ascultation, without wheeze rails or rhonci. No use of accessory muscles, Good respiratory effort. good air entry bilaterally CHEST: Nontender to palpation.  CARDIOVASCULAR: S1 and S2. Regular rate and rhythm. No murmurs, rubs, or gallops. No edema. Pedal pulses 2+ bilaterally.   GASTROINTESTINAL: Soft, nontender, nondistended. No masses. Positive bowel sounds. No hepatosplenomegaly.  MUSCULOSKELETAL: No swelling, clubbing, or edema. Range of motion full in all extremities.  NEUROLOGIC: Cranial nerves II through XII are intact. No gross focal neurological deficits. Sensation intact. Reflexes intact.  SKIN: No ulceration, lesions, rashes, or cyanosis. Skin warm and dry. Turgor intact.  PSYCHIATRIC: Mood, affect blunted. The patient is sleeping but arousable Insight, judgment appears to be intact.      LABORATORY PANEL:   CBC  Recent Labs Lab 11/28/15 0114  WBC 19.2*  HGB 9.5*  HCT 28.9*  PLT 225   ------------------------------------------------------------------------------------------------------------------  Chemistries   Recent Labs Lab 11/28/15 0114  NA 137  K 3.2*  CL 107  CO2 23  GLUCOSE 113*  BUN 13  CREATININE 0.92  CALCIUM 7.7*   ------------------------------------------------------------------------------------------------------------------  Cardiac Enzymes No results for input(s): TROPONINI in the last 168 hours. ------------------------------------------------------------------------------------------------------------------  RADIOLOGY:  US Abdomen Limited Ruq  11/28/2015  CLINICAL DATA:  Right upper quadrant abdominal pain x1 week EXAM: US ABDOMEN LIMITED - RIGHT UPPER QUADRANT COMPARISON:  None. FINDINGS: Gallbladder: Mild gallbladder sludge. No gallbladder wall thickening or pericholecystic fluid. Negative sonographic Murphy's sign. Common bile duct: Diameter: 2 mm Liver: No focal lesion identified. Within normal limits in parenchymal echogenicity. IMPRESSION: Mild gallbladder sludge. Otherwise negative right upper quadrant ultrasound. Electronically Signed   By: Charline Bills M.D.   On: 11/28/2015 09:02    EKG:   Orders placed or performed during the hospital encounter of 11/27/15  . EKG 12-Lead  . EKG 12-Lead     ASSESSMENT AND PLAN:   25 year old African-American female admitted 11/27/2015 with sepsis  1.Sepsis, meeting  septic criteria by fever, tachycardia, leukocytosis present on arrival. Source pyelonephritis Panculture. Broad-spectrum antibiotics including ceftriaxone and taper antibiotics when culture data returns. Given continuous tachycardia we'll bolus 1 L fluid increase rate to 125 mL an hour 2. Hypokalemia: Replace 3. Venous thrombus embolism prophylactic: Lovenox  Disposition: Transfer out of ICU continue off unit telemetry      All the records are reviewed and case discussed with Care Management/Social Workerr. Management plans discussed with the patient, family and they are in agreement.  CODE STATUS: Full  TOTAL TIME TAKING CARE OF THIS PATIENT: 28 minutes.   POSSIBLE D/C IN 1-2 DAYS, DEPENDING ON CLINICAL CONDITION.   Hower,  Mardi MainlandDavid K M.D on 11/28/2015 at 1:17 PM  Between 7am to 6pm - Pager - [phone]  After 6pm: House Pager: [phone]  [facility]  Office  [phone]  CC: Primary care physician; [facility]

## 2015-11-28 NOTE — Progress Notes (Signed)
Pharmacy Antibiotic Note  Shelley MoreSherica S Nelson is a 25 y.o. female admitted on 11/27/2015 with sepsis and pyelonephritis.  Pharmacy has been consulted for ceftriaxone dosing.  Plan: Will continue dosing with ceftraixone 1 g IV q24h.   Pharmacy will continue to monitor and adjust per consult.   Height: 5\' 4"  (162.6 cm) Weight: 145 lb (65.772 kg) IBW/kg (Calculated) : 54.7  Temp (24hrs), Avg:100.2 F (37.9 C), Min:98.7 F (37.1 C), Max:101.1 F (38.4 C)   Recent Labs Lab 11/27/15 1615 11/27/15 2231 11/28/15 0114  WBC 19.5*  --  19.2*  CREATININE 1.15*  --  0.92  LATICACIDVEN  --  1.8 1.1    Estimated Creatinine Clearance: 87.2 mL/min (by C-G formula based on Cr of 0.92).    No Known Allergies  Antimicrobials this admission: Ceftriaxone 3/13>>  Dose adjustments this admission:   Microbiology results: 3/13 BCx: NGTD x 2  3/13 UCx: sent  3/13 MRSA PCR: positive  Thank you for allowing pharmacy to be a part of this patient's care.  Luisa HartChristy, Josseline Reddin D 11/28/2015 10:58 AM

## 2015-11-28 NOTE — Progress Notes (Signed)
Notified Dr. Clint GuyHower that patient's stool is brown, cloudy and stool- smelling- I am not sure if she may have a fistula- also results of urine culture are not back yet. No new orders.

## 2015-11-28 NOTE — Care Management (Signed)
Admitted to stepdown from home with sepsis due to UTI and pyelonephritis.  Heart rate  sustained at greater than 100.  WBC up and receiving IVF and IV antibiotics.  Documented history of mild mental retardation

## 2015-11-28 NOTE — Progress Notes (Signed)
Notified Dr. Clint GuyHower of patient history of MR and when and why patient was admitted.  Patient having fever of 101 but states that she can not tolerate pills and wanted to have liquid motrin.  Dr. Clint GuyHower stated he would place order for Motrin.

## 2015-11-29 LAB — CBC
HEMATOCRIT: 28.3 % — AB (ref 35.0–47.0)
HEMOGLOBIN: 9.3 g/dL — AB (ref 12.0–16.0)
MCH: 25.6 pg — AB (ref 26.0–34.0)
MCHC: 32.9 g/dL (ref 32.0–36.0)
MCV: 77.8 fL — AB (ref 80.0–100.0)
Platelets: 266 10*3/uL (ref 150–440)
RBC: 3.63 MIL/uL — ABNORMAL LOW (ref 3.80–5.20)
RDW: 13.9 % (ref 11.5–14.5)
WBC: 17.1 10*3/uL — ABNORMAL HIGH (ref 3.6–11.0)

## 2015-11-29 LAB — BASIC METABOLIC PANEL
Anion gap: 5 (ref 5–15)
BUN: 12 mg/dL (ref 6–20)
CALCIUM: 8 mg/dL — AB (ref 8.9–10.3)
CHLORIDE: 114 mmol/L — AB (ref 101–111)
CO2: 20 mmol/L — AB (ref 22–32)
CREATININE: 0.82 mg/dL (ref 0.44–1.00)
GFR calc non Af Amer: >60 mL/min (ref >60)
GLUCOSE: 101 mg/dL — AB (ref 65–99)
Potassium: 3.6 mmol/L (ref 3.5–5.1)
Sodium: 139 mmol/L (ref 135–145)

## 2015-11-29 LAB — URINE CULTURE: Culture: >=100000

## 2015-11-29 MED ORDER — CIPROFLOXACIN HCL 500 MG PO TABS: 500.0000 mg | ORAL_TABLET | Freq: Two times a day (BID) | ORAL | Status: DC

## 2015-11-29 NOTE — Care Management Important Message (Signed)
Important Message  Patient Details  Name: [name] MRN: [id number] Date of Birth: 02-03-1991   Medicare Important Message Given:  Yes    Chapman FitchBOWEN, Ludwig Tugwell T, RN 11/29/2015, 10:33 AM

## 2015-11-29 NOTE — Discharge Summary (Signed)
[place] at [place] NAME: [name]    MR#:  [id number]  DATE OF BIRTH:  06-07-1991  DATE OF ADMISSION:  11/27/2015 ADMITTING PHYSICIAN: Lance Coon, MD  DATE OF DISCHARGE: No discharge date for patient encounter.  PRIMARY CARE PHYSICIAN: [facility]    ADMISSION DIAGNOSIS:  Pyelonephritis [N12] Sepsis, due to unspecified organism ([place]) [A41.9]  DISCHARGE DIAGNOSIS:  Sepsis secondary to pyelonephritis unspecified location , Escherichia coli infection    SECONDARY DIAGNOSIS:   Past Medical History  Diagnosis Date  . Mild mental retardation   . Herpes labialis     HOSPITAL COURSE:  [name]  is a 25 y.o. female admitted 11/27/2015 with chief complaint fevers and chills. Please see H&P performed by Dr. Jannifer Franklin for further information. She met septic criteria on arrival started on ceftriaxone for presumptive urinary tract/pyelonephritis infection culture data returned with pansensitive Escherichia coli. She has been afebrile for 24 hours marked improvement of symptoms and stable for discharge.  DISCHARGE CONDITIONS:   Stable/improved  CONSULTS OBTAINED:     DRUG ALLERGIES:  No Known Allergies  DISCHARGE MEDICATIONS:   Current Discharge Medication List    START taking these medications   Details  ciprofloxacin (CIPRO) 500 MG tablet Take 1 tablet (500 mg total) by mouth 2 (two) times daily. Qty: 10 tablet, Refills: 0      CONTINUE these medications which have NOT CHANGED   Details  ibuprofen (ADVIL,MOTRIN) 200 MG tablet Take 200 mg by mouth every 6 (six) hours as needed for fever, headache or moderate pain.         DISCHARGE INSTRUCTIONS:    DIET:  Regular diet  DISCHARGE CONDITION:  Good  ACTIVITY:  Activity as tolerated  OXYGEN:  Home Oxygen: No.   Oxygen Delivery: room air  DISCHARGE LOCATION:  home   If you experience worsening of your admission symptoms, develop  shortness of breath, life threatening emergency, suicidal or homicidal thoughts you must seek medical attention immediately by calling 911 or calling your MD immediately  if symptoms less severe.  You Must read complete instructions/literature along with all the possible adverse reactions/side effects for all the Medicines you take and that have been prescribed to you. Take any new Medicines after you have completely understood and accpet all the possible adverse reactions/side effects.   Please note  You were cared for by a hospitalist during your hospital stay. If you have any questions about your discharge medications or the care you received while you were in the hospital after you are discharged, you can call the unit and asked to speak with the hospitalist on call if the hospitalist that took care of you is not available. Once you are discharged, your primary care physician will handle any further medical issues. Please note that NO REFILLS for any discharge medications will be authorized once you are discharged, as it is imperative that you return to your primary care physician (or establish a relationship with a primary care physician if you do not have one) for your aftercare needs so that they can reassess your need for medications and monitor your lab values.    On the day of Discharge:   VITAL SIGNS:  Blood pressure 117/80, pulse 108, temperature 98.4 F (36.9 C), temperature source Oral, resp. rate 18, height 5' 4"  (1.626 m), weight 71.26 kg (157 lb 1.6 oz), SpO2 100 %.  I/O:   Intake/Output Summary (Last 24 hours) at  11/29/15 1058 Last data filed at 11/29/15 0502  Gross per 24 hour  Intake 2216.21 ml  Output    600 ml  Net 1616.21 ml    PHYSICAL EXAMINATION:  GENERAL:  25 y.o.-year-old patient lying in the bed with no acute distress.  EYES: Pupils equal, round, reactive to light and accommodation. No scleral icterus. Extraocular muscles intact.  HEENT: Head atraumatic,  normocephalic. Oropharynx and nasopharynx clear.  NECK:  Supple, no jugular venous distention. No thyroid enlargement, no tenderness.  LUNGS: Normal breath sounds bilaterally, no wheezing, rales,rhonchi or crepitation. No use of accessory muscles of respiration.  CARDIOVASCULAR: S1, S2 normal. No murmurs, rubs, or gallops.  ABDOMEN: Soft, non-tender, non-distended. Bowel sounds present. No organomegaly or mass.  EXTREMITIES: No pedal edema, cyanosis, or clubbing.  NEUROLOGIC: Cranial nerves II through XII are intact. Muscle strength 5/5 in all extremities. Sensation intact. Gait not checked.  PSYCHIATRIC: The patient is alert and oriented x 3.  SKIN: No obvious rash, lesion, or ulcer.   DATA REVIEW:   CBC  Recent Labs Lab 11/29/15 0523  WBC 17.1*  HGB 9.3*  HCT 28.3*  PLT 266    Chemistries   Recent Labs Lab 11/29/15 0523  NA 139  K 3.6  CL 114*  CO2 20*  GLUCOSE 101*  BUN 12  CREATININE 0.82  CALCIUM 8.0*    Cardiac Enzymes No results for input(s): TROPONINI in the last 168 hours.  Microbiology Results  Results for orders placed or performed during the hospital encounter of 11/27/15  Urine culture     Status: None   Collection Time: 11/27/15  4:15 PM  Result Value Ref Range Status   Specimen Description URINE, CLEAN CATCH  Final   Special Requests NONE  Final   Culture >=100,000 COLONIES/mL ESCHERICHIA COLI  Final   Report Status 11/29/2015 FINAL  Final   Organism ID, Bacteria ESCHERICHIA COLI  Final      Susceptibility   Escherichia coli - MIC*    AMPICILLIN >=32 RESISTANT Resistant     CEFAZOLIN <=4 SENSITIVE Sensitive     CEFTRIAXONE <=1 SENSITIVE Sensitive     CIPROFLOXACIN <=0.25 SENSITIVE Sensitive     GENTAMICIN <=1 SENSITIVE Sensitive     IMIPENEM <=0.25 SENSITIVE Sensitive     NITROFURANTOIN <=16 SENSITIVE Sensitive     TRIMETH/SULFA <=20 SENSITIVE Sensitive     AMPICILLIN/SULBACTAM >=32 RESISTANT Resistant     PIP/TAZO <=4 SENSITIVE Sensitive      Extended ESBL NEGATIVE Sensitive     * >=100,000 COLONIES/mL ESCHERICHIA COLI  Blood culture (routine x 2)     Status: None (Preliminary result)   Collection Time: 11/27/15 10:24 PM  Result Value Ref Range Status   Specimen Description BLOOD LEFT ANTECUBITAL  Final   Special Requests BOTTLES DRAWN AEROBIC AND ANAEROBIC 5ML  Final   Culture NO GROWTH 2 DAYS  Final   Report Status PENDING  Incomplete  Blood culture (routine x 2)     Status: None (Preliminary result)   Collection Time: 11/27/15 10:24 PM  Result Value Ref Range Status   Specimen Description BLOOD RIGHT ANTECUBITAL  Final   Special Requests BOTTLES DRAWN AEROBIC AND ANAEROBIC 5ML  Final   Culture NO GROWTH 2 DAYS  Final   Report Status PENDING  Incomplete  MRSA PCR Screening     Status: Abnormal   Collection Time: 11/27/15 11:22 PM  Result Value Ref Range Status   MRSA by PCR POSITIVE (A) NEGATIVE Final  Comment:        The GeneXpert MRSA Assay (FDA approved for NASAL specimens only), is one component of a comprehensive MRSA colonization surveillance program. It is not intended to diagnose MRSA infection nor to guide or monitor treatment for MRSA infections. CRITICAL RESULT CALLED TO, READ BACK BY AND VERIFIED WITH: PAMELA RUMLEY ON 11/28/15 AT 0107 [facility]     RADIOLOGY:  US Abdomen Limited Ruq  11/28/2015  CLINICAL DATA:  Right upper quadrant abdominal pain x1 week EXAM: US ABDOMEN LIMITED - RIGHT UPPER QUADRANT COMPARISON:  None. FINDINGS: Gallbladder: Mild gallbladder sludge. No gallbladder wall thickening or pericholecystic fluid. Negative sonographic Murphy's sign. Common bile duct: Diameter: 2 mm Liver: No focal lesion identified. Within normal limits in parenchymal echogenicity. IMPRESSION: Mild gallbladder sludge. Otherwise negative right upper quadrant ultrasound. Electronically Signed   By: Julian Hy M.D.   On: 11/28/2015 09:02     Management plans discussed with the patient, family and they  are in agreement.  CODE STATUS:     Code Status Orders        Start     Ordered   11/27/15 2327  Full code   Continuous     11/27/15 2326    Code Status History    Date Active Date Inactive Code Status Order ID Comments User Context   This patient has a current code status but no historical code status.      TOTAL TIME TAKING CARE OF THIS PATIENT: 28 minutes.    Ridley Dileo,  Karenann Cai.D on 11/29/2015 at 10:58 AM  Between 7am to 6pm - Pager - [phone]  After 6pm go to [email or web address] - password [email or web address] [place] [facility]  Office  [phone]  CC: Primary care physician; [place]

## 2015-11-29 NOTE — Progress Notes (Signed)
Pt alert and oriented. Anxious to go home. Discharged to home. Prescriptions given. IV site removed. Discharge summary given to pt.

## 2015-11-29 NOTE — Progress Notes (Signed)
To Whom It May Concern I was taking care of Ms. [name] 11/27/15--11/29/15 at [facility]. [name] was present in the hospital during this time and should be excused from work/school/other obligations Please call for questions/concerns Marge Duncansave Hower MD [phone]

## 2015-12-02 LAB — CULTURE, BLOOD (ROUTINE X 2)
CULTURE: NO GROWTH
Culture: NO GROWTH

## 2016-07-04 DIAGNOSIS — Z833 Family history of diabetes mellitus: Secondary | ICD-10-CM | POA: Insufficient documentation

## 2017-03-25 ENCOUNTER — Encounter: Payer: Self-pay | Admitting: Emergency Medicine

## 2017-03-25 ENCOUNTER — Emergency Department
Admission: EM | Admit: 2017-03-25 | Discharge: 2017-03-25 | Disposition: A | Discharge status: Home / Self Care | Payer: Medicare Other | Attending: Emergency Medicine | Admitting: Emergency Medicine

## 2017-03-25 DIAGNOSIS — J029 Acute pharyngitis, unspecified: Secondary | ICD-10-CM | POA: Diagnosis present

## 2017-03-25 DIAGNOSIS — N179 Acute kidney failure, unspecified: Secondary | ICD-10-CM | POA: Diagnosis not present

## 2017-03-25 DIAGNOSIS — F79 Unspecified intellectual disabilities: Secondary | ICD-10-CM | POA: Diagnosis not present

## 2017-03-25 DIAGNOSIS — N390 Urinary tract infection, site not specified: Secondary | ICD-10-CM | POA: Diagnosis not present

## 2017-03-25 DIAGNOSIS — M545 Low back pain: Secondary | ICD-10-CM | POA: Diagnosis not present

## 2017-03-25 LAB — URINALYSIS, COMPLETE (UACMP) WITH MICROSCOPIC
Bilirubin Urine: NEGATIVE
GLUCOSE, UA: NEGATIVE mg/dL
Hgb urine dipstick: NEGATIVE
KETONES UR: NEGATIVE mg/dL
Nitrite: NEGATIVE
PROTEIN: NEGATIVE mg/dL
Specific Gravity, Urine: 1.02 (ref 1.005–1.030)
pH: 6 (ref 5.0–8.0)

## 2017-03-25 LAB — POCT PREGNANCY, URINE: PREG TEST UR: NEGATIVE

## 2017-03-25 LAB — POCT RAPID STREP A: Streptococcus, Group A Screen (Direct): NEGATIVE

## 2017-03-25 MED ORDER — IBUPROFEN 100 MG/5ML PO SUSP
600.0000 mg | Freq: Once | ORAL | Status: AC
  Administered 2017-03-25: 600 mg via ORAL

## 2017-03-25 MED ORDER — IBUPROFEN 100 MG/5ML PO SUSP
ORAL | Status: AC
  Filled 2017-03-25: qty 30

## 2017-03-25 MED ORDER — CEPHALEXIN 500 MG PO CAPS: 500.0000 mg | ORAL_CAPSULE | Freq: Three times a day (TID) | ORAL | 0 refills | Status: DC

## 2017-03-25 NOTE — Discharge Instructions (Signed)
Begin Taking antibiotics today and until finished taking in 10 days. Increase fluids. Tylenol or ibuprofen as needed for fever or body aches. [facility] if any continued problems.

## 2017-03-25 NOTE — ED Triage Notes (Signed)
Presents with lower back pain and sore throat since yesterday  Unsure if she was febrile PTA  Low grade fever on arrival  States she is having increased pain with swallowing

## 2017-03-25 NOTE — ED Provider Notes (Signed)
[facility] Emergency Department Provider Note  ____________________________________________   None    (approximate)  I have reviewed the triage vital signs and the nursing notes.   HISTORY  Chief Complaint Back Pain and Sore Throat    HPI [name] is a 25 y.o. female is here complaining of low back pain and sore throat that began yesterday. Patient is unsure if she had a low-grade fever last night. She states that she tried to swallow ibuprofen at home unsuccessfully. She denies any nausea or vomiting. She denies any other upper respiratory symptoms. She does have a history of urinary tract infections. Patient complains of bodyaches. She rates her pain as a 10 over 10 at this time.   Past Medical History:  Diagnosis Date  . Herpes labialis   . Mild mental retardation     Patient Active Problem List   Diagnosis Date Noted  . Sepsis (HCC) 11/27/2015  . Pyelonephritis 11/27/2015  . AKI (acute kidney injury) (HCC) 11/27/2015    Past Surgical History:  Procedure Laterality Date  . CESAREAN SECTION      Prior to Admission medications   Medication Sig Start Date End Date Taking? Authorizing Provider  cephALEXin (KEFLEX) 500 MG capsule Take 1 capsule (500 mg total) by mouth 3 (three) times daily. 03/25/17   Tommi Rumps, PA-C    Allergies Patient has no known allergies.  No family history on file.  Social History Social History  Substance Use Topics  . Smoking status: Never Smoker  . Smokeless tobacco: Never Used  . Alcohol use No    Review of Systems Constitutional: Questionable fever/ no chills ENT: Positive sore throat. Cardiovascular: Denies chest pain. Respiratory: Denies shortness of breath. Gastrointestinal: No abdominal pain.  No nausea, no vomiting.   Genitourinary: Negative for dysuria. Musculoskeletal: Positive for low back pain. Skin: Negative for rash. Neurological: Negative for headaches, focal weakness or  numbness.   ____________________________________________   PHYSICAL EXAM:  VITAL SIGNS: ED Triage Vitals  Enc Vitals Group     BP 03/25/17 1336 119/67     Pulse Rate 03/25/17 1336 94     Resp 03/25/17 1336 20     Temp 03/25/17 1336 99.1 F (37.3 C)     Temp Source 03/25/17 1336 Oral     SpO2 03/25/17 1336 99 %     Weight 03/25/17 1332 187 lb (84.8 kg)     Height 03/25/17 1332 5\' 5"  (1.651 m)     Head Circumference --      Peak Flow --      Pain Score 03/25/17 1331 10     Pain Loc --      Pain Edu? --      Excl. in GC? --     Constitutional: Alert and oriented. Well appearing and in no acute distress. Eyes: Conjunctivae are normal. Head: Atraumatic. Nose: No congestion/rhinnorhea.  EACs and TMs are clear bilaterally. Mouth/Throat: Mucous membranes are moist.  Oropharynx non-erythematous. Neck: No stridor.   Hematological/Lymphatic/Immunilogical: No cervical lymphadenopathy. Cardiovascular: Normal rate, regular rhythm. Grossly normal heart sounds.  Good peripheral circulation. Respiratory: Normal respiratory effort.  No retractions. Lungs CTAB. Gastrointestinal: Soft and nontender. No distention.  No CVA tenderness. Musculoskeletal: Moves upper and lower extremities without difficulty. Normal gait was noted. Neurologic:  Normal speech and language. No gross focal neurologic deficits are appreciated. No gait instability. Skin:  Skin is warm, dry and intact. No rash noted. Psychiatric: Mood and affect are normal. Speech  and behavior are normal.  ____________________________________________   LABS (all labs ordered are listed, but only abnormal results are displayed)  Labs Reviewed  URINALYSIS, COMPLETE (UACMP) WITH MICROSCOPIC - Abnormal; Notable for the following:       Result Value   Color, Urine YELLOW (*)    APPearance HAZY (*)    Leukocytes, UA TRACE (*)    Bacteria, UA RARE (*)    Squamous Epithelial / LPF 6-30 (*)    All other components within normal limits   CULTURE, GROUP A STREP (THRC)  URINE CULTURE  POC URINE PREG, ED  POCT PREGNANCY, URINE  POCT RAPID STREP A    RADIOLOGY  No results found.  ____________________________________________   PROCEDURES  Procedure(s) performed: None  Procedures  Critical Care performed: No  ____________________________________________   INITIAL IMPRESSION / ASSESSMENT AND PLAN / ED COURSE  Pertinent labs & imaging results that were available during my care of the patient were reviewed by me and considered in my medical decision making (see chart for details).  Rapid strep was negative but urinalysis did show urinary tract infection. Patient was given a prescription for Keflex 500 mg 3 times a day for 10 days. She is to increase fluids. Take ibuprofen or Tylenol as needed for body aches or fever. She is to follow-up with [facility] PCP at [facility] if any continued problems.      ____________________________________________   FINAL CLINICAL IMPRESSION(S) / ED DIAGNOSES  Final diagnoses:  Acute urinary tract infection  Sore throat      NEW MEDICATIONS STARTED DURING THIS VISIT:  Discharge Medication List as of 03/25/2017  3:03 PM    START taking these medications   Details  cephALEXin (KEFLEX) 500 MG capsule Take 1 capsule (500 mg total) by mouth 3 (three) times daily., Starting Tue 03/25/2017, Print         Note:  This document was prepared using Dragon voice recognition software and may include unintentional dictation errors.    Tommi RumpsSummers, Traevion Poehler L, PA-C 03/25/17 1514    Schaevitz, Myra Rudeavid Matthew, MD 03/25/17 1539

## 2017-03-27 LAB — CULTURE, GROUP A STREP (THRC)

## 2017-03-28 LAB — URINE CULTURE: SPECIAL REQUESTS: NORMAL

## 2017-06-25 DIAGNOSIS — R002 Palpitations: Secondary | ICD-10-CM | POA: Insufficient documentation

## 2017-07-25 ENCOUNTER — Encounter: Payer: Self-pay | Admitting: Emergency Medicine

## 2017-07-25 ENCOUNTER — Other Ambulatory Visit: Payer: Self-pay

## 2017-07-25 ENCOUNTER — Emergency Department
Admission: EM | Admit: 2017-07-25 | Discharge: 2017-07-26 | Disposition: A | Discharge status: Home / Self Care | Payer: Medicare Other | Attending: Emergency Medicine | Admitting: Emergency Medicine

## 2017-07-25 ENCOUNTER — Emergency Department: Payer: Medicare Other

## 2017-07-25 DIAGNOSIS — F99 Mental disorder, not otherwise specified: Secondary | ICD-10-CM | POA: Insufficient documentation

## 2017-07-25 DIAGNOSIS — R0789 Other chest pain: Secondary | ICD-10-CM | POA: Insufficient documentation

## 2017-07-25 DIAGNOSIS — R079 Chest pain, unspecified: Secondary | ICD-10-CM | POA: Diagnosis present

## 2017-07-25 LAB — CBC
HEMATOCRIT: 36.4 % (ref 35.0–47.0)
Hemoglobin: 11.8 g/dL — ABNORMAL LOW (ref 12.0–16.0)
MCH: 25.9 pg — ABNORMAL LOW (ref 26.0–34.0)
MCHC: 32.3 g/dL (ref 32.0–36.0)
MCV: 80.2 fL (ref 80.0–100.0)
PLATELETS: 310 10*3/uL (ref 150–440)
RBC: 4.55 MIL/uL (ref 3.80–5.20)
RDW: 14.5 % (ref 11.5–14.5)
WBC: 11.1 10*3/uL — ABNORMAL HIGH (ref 3.6–11.0)

## 2017-07-25 LAB — BASIC METABOLIC PANEL
Anion gap: 6 (ref 5–15)
BUN: 15 mg/dL (ref 6–20)
CALCIUM: 9.2 mg/dL (ref 8.9–10.3)
CO2: 23 mmol/L (ref 22–32)
CREATININE: 0.72 mg/dL (ref 0.44–1.00)
Chloride: 110 mmol/L (ref 101–111)
GFR calc non Af Amer: >60 mL/min (ref >60)
Glucose, Bld: 117 mg/dL — ABNORMAL HIGH (ref 65–99)
Potassium: 4 mmol/L (ref 3.5–5.1)
Sodium: 139 mmol/L (ref 135–145)

## 2017-07-25 LAB — TROPONIN I: Troponin I: <0.03 ng/mL (ref <0.03)

## 2017-07-25 NOTE — ED Provider Notes (Signed)
[facility] Emergency Department Provider Note  ____________________________________________   First MD Initiated Contact with Patient 07/25/17 2359     (approximate)  I have reviewed the triage vital signs and the nursing notes.   HISTORY  Chief Complaint Chest Pain  Level 5 exemption history limited by the patient's developmental delay  HPI [name] is a 26 y.o. female who comes to the emergency department with sharp right-sided chest pain radiating to her left shoulder that began earlier today while at rest.  Pain is moderate severity worse with deep inspiration and associated with shortness of breath.  She has no history of deep vein thrombosis or pulmonary embolism.    Past Medical History:  Diagnosis Date  . Herpes labialis   . Mild mental retardation     Patient Active Problem List   Diagnosis Date Noted  . Sepsis (HCC) 11/27/2015  . Pyelonephritis 11/27/2015  . AKI (acute kidney injury) (HCC) 11/27/2015    Past Surgical History:  Procedure Laterality Date  . CESAREAN SECTION      Prior to Admission medications   Medication Sig Start Date End Date Taking? Authorizing Provider  cephALEXin (KEFLEX) 500 MG capsule Take 1 capsule (500 mg total) by mouth 3 (three) times daily. 03/25/17   Tommi RumpsSummers, Rhonda L, PA-C    Allergies Patient has no known allergies.  No family history on file.  Social History Social History   Tobacco Use  . Smoking status: Never Smoker  . Smokeless tobacco: Never Used  Substance Use Topics  . Alcohol use: No  . Drug use: No    Review of Systems  Level 5 exemption history limited by the patient's developmental delay ____________________________________________   PHYSICAL EXAM:  VITAL SIGNS: ED Triage Vitals  Enc Vitals Group     BP 07/25/17 2211 130/80     Pulse Rate 07/25/17 2211 85     Resp 07/25/17 2211 18     Temp 07/25/17 2211 98.2 F (36.8 C)     Temp Source 07/25/17 2211 Oral     SpO2 07/25/17 2211 100 %     Weight 07/25/17 2212 188 lb (85.3 kg)     Height 07/25/17 2212 5\' 1"  (1.549 m)     Head Circumference --      Peak Flow --      Pain Score 07/25/17 2210 10     Pain Loc --      Pain Edu? --      Excl. in GC? --     Constitutional: Speaks in slow methodical sentences no acute distress no respiratory distress Eyes: PERRL EOMI. Head: Atraumatic. Nose: No congestion/rhinnorhea. Mouth/Throat: No trismus Neck: No stridor.   Cardiovascular: Normal rate, regular rhythm. Grossly normal heart sounds.  Good peripheral circulation. Respiratory: Normal respiratory effort.  No retractions. Lungs CTAB and moving good air Gastrointestinal: Obese abdomen soft nontender Musculoskeletal: No lower extremity edema legs are equal in size Neurologic:  No gross focal neurologic deficits are appreciated. Skin:  Skin is warm, dry and intact. No rash noted.    ____________________________________________   DIFFERENTIAL includes but not limited to  Pulmonary embolism, aortic dissection, acute coronary syndrome, pneumothorax ____________________________________________   LABS (all labs ordered are listed, but only abnormal results are displayed)  Labs Reviewed  BASIC METABOLIC PANEL - Abnormal; Notable for the following components:      Result Value   Glucose, Bld 117 (*)    All other components within normal limits  CBC -  Abnormal; Notable for the following components:   WBC 11.1 (*)    Hemoglobin 11.8 (*)    MCH 25.9 (*)    All other components within normal limits  TROPONIN I  HCG, QUANTITATIVE, PREGNANCY    Blood work reviewed and interpreted by me with no evidence of acute disease __________________________________________  EKG  ED ECG REPORT I, Merrily BrittleNeil Darriona Dehaas, the attending physician, personally viewed and interpreted this ECG.  Date: 07/25/2017 EKG Time:  Rate: 80 Rhythm: normal sinus rhythm QRS Axis: normal Intervals: normal ST/T Wave  abnormalities: normal Narrative Interpretation: no evidence of acute ischemia  ____________________________________________  RADIOLOGY  CT angiogram of the chest reviewed by me with no acute disease ____________________________________________   PROCEDURES  Procedure(s) performed: no  Procedures  Critical Care performed: no  Observation: no ____________________________________________   INITIAL IMPRESSION / ASSESSMENT AND PLAN / ED COURSE  Pertinent labs & imaging results that were available during my care of the patient were reviewed by me and considered in my medical decision making (see chart for details).  The patient is well-appearing and hemodynamically stable although with pleuritic chest pain.  She does take Depo-Provera and therefore is not PERC negative.  I doubt acute coronary syndrome at this time, however I do have some concern for pulmonary embolism.  CT pulmonary angiogram is pending.    Fortunately the patient CT scan is negative for acute pathology.  At this point with her atypical chest pain I do not believe she requires a subsequent troponin nor further cardiac risk stratification inpatient.  She has follow-up with Dr. Juliann Paresallwood already scheduled.  At this point she is medically stable for outpatient management verbalizes understanding and agreement with the plan.  ____________________________________________   FINAL CLINICAL IMPRESSION(S) / ED DIAGNOSES  Final diagnoses:  Atypical chest pain      NEW MEDICATIONS STARTED DURING THIS VISIT:  This SmartLink is deprecated. Use AVSMEDLIST instead to display the medication list for a patient.   Note:  This document was prepared using Dragon voice recognition software and may include unintentional dictation errors.     Merrily Brittleifenbark, Shelley Thatch, MD 07/27/17 [phone]

## 2017-07-25 NOTE — ED Triage Notes (Signed)
Pt reports that she has been having some chest pain and was seen in [facility] on 10/[facility]. Pt reports that she has a schedules echocardiogram appointment on 11/14 but the pain has become worse. Pt is in NAD at this time.

## 2017-07-26 ENCOUNTER — Emergency Department: Payer: Medicare Other

## 2017-07-26 DIAGNOSIS — R0789 Other chest pain: Secondary | ICD-10-CM | POA: Diagnosis not present

## 2017-07-26 LAB — HCG, QUANTITATIVE, PREGNANCY

## 2017-07-26 MED ORDER — SODIUM CHLORIDE 0.9 % IV BOLUS (SEPSIS)
1000.0000 mL | Freq: Once | INTRAVENOUS | Status: AC
  Administered 2017-07-26: 1000 mL via INTRAVENOUS

## 2017-07-26 MED ORDER — KETOROLAC TROMETHAMINE 30 MG/ML IJ SOLN
15.0000 mg | Freq: Once | INTRAMUSCULAR | Status: AC
  Administered 2017-07-26: 15 mg via INTRAVENOUS
  Filled 2017-07-26: qty 1

## 2017-07-26 MED ORDER — IOPAMIDOL (ISOVUE-370) INJECTION 76%
75.0000 mL | Freq: Once | INTRAVENOUS | Status: AC | PRN
  Administered 2017-07-26: 75 mL via INTRAVENOUS

## 2017-07-26 NOTE — Discharge Instructions (Signed)
Fortunately today your chest x-ray, your EKG, your blood work, and your CT scan were all very normal.  Please make an appointment to follow-up with your primary care physician in 2 days for recheck and return to the emergency department sooner for any concerns.  It was a pleasure to take care of you today, and thank you for coming to our emergency department.  If you have any questions or concerns before leaving please ask the nurse to grab me and I'm more than happy to go through your aftercare instructions again.  If you were prescribed any opioid pain medication today such as Norco, Vicodin, Percocet, morphine, hydrocodone, or oxycodone please make sure you do not drive when you are taking this medication as it can alter your ability to drive safely.  If you have any concerns once you are home that you are not improving or are in fact getting worse before you can make it to your follow-up appointment, please do not hesitate to call 911 and come back for further evaluation.  Merrily BrittleNeil Maximos Zayas, MD  Results for orders placed or performed during the hospital encounter of 07/25/17  Basic metabolic panel  Result Value Ref Range   Sodium 139 135 - 145 mmol/L   Potassium 4.0 3.5 - 5.1 mmol/L   Chloride 110 101 - 111 mmol/L   CO2 23 22 - 32 mmol/L   Glucose, Bld 117 (H) 65 - 99 mg/dL   BUN 15 6 - 20 mg/dL   Creatinine, Ser 2.950.72 0.44 - 1.00 mg/dL   Calcium 9.2 8.9 - 62.110.3 mg/dL   GFR calc non Af Amer >60 >60 mL/min   GFR calc Af Amer >60 >60 mL/min   Anion gap 6 5 - 15  CBC  Result Value Ref Range   WBC 11.1 (H) 3.6 - 11.0 K/uL   RBC 4.55 3.80 - 5.20 MIL/uL   Hemoglobin 11.8 (L) 12.0 - 16.0 g/dL   HCT 30.836.4 65.735.0 - 84.647.0 %   MCV 80.2 80.0 - 100.0 fL   MCH 25.9 (L) 26.0 - 34.0 pg   MCHC 32.3 32.0 - 36.0 g/dL   RDW 96.214.5 95.211.5 - 84.114.5 %   Platelets 310 150 - 440 K/uL  Troponin I  Result Value Ref Range   Troponin I <0.03 <0.03 ng/mL  hCG, quantitative, pregnancy  Result Value Ref Range   hCG,  Beta Chain, Quant, S <1 <5 mIU/mL   Dg Chest 2 View  Result Date: 07/25/2017 CLINICAL DATA:  Chest pain EXAM: CHEST  2 VIEW COMPARISON:  None. FINDINGS: The heart size and mediastinal contours are within normal limits. Both lungs are clear. The visualized skeletal structures are unremarkable. IMPRESSION: No active cardiopulmonary disease. Electronically Signed   By: Jasmine PangKim  Fujinaga M.D.   On: 07/25/2017 23:00   Ct Angio Chest Pe W/cm &/or Wo Cm  Result Date: 07/26/2017 CLINICAL DATA:  Chest pain EXAM: CT ANGIOGRAPHY CHEST WITH CONTRAST TECHNIQUE: Multidetector CT imaging of the chest was performed using the standard protocol during bolus administration of intravenous contrast. Multiplanar CT image reconstructions and MIPs were obtained to evaluate the vascular anatomy. CONTRAST:  75mL ISOVUE-370 IOPAMIDOL (ISOVUE-370) INJECTION 76% COMPARISON:  07/25/2017 FINDINGS: Cardiovascular: Satisfactory opacification of the pulmonary arteries to the segmental level. No evidence of pulmonary embolism. Normal heart size. No pericardial effusion. Mediastinum/Nodes: No enlarged mediastinal, hilar, or axillary lymph nodes. Thyroid gland, trachea, and esophagus demonstrate no significant findings. Lungs/Pleura: Lungs are clear. No pleural effusion or pneumothorax. Upper Abdomen: No acute  abnormality. Musculoskeletal: No chest wall abnormality. No acute or significant osseous findings. Review of the MIP images confirms the above findings. IMPRESSION: Negative for acute pulmonary embolus or aortic dissection. Clear lung fields. Electronically Signed   By: Jasmine PangKim  Fujinaga M.D.   On: 07/26/2017 01:57

## 2017-07-26 NOTE — ED Notes (Signed)
Attempted 2 IV starts and then asked Elon JesterMichele to attempt one

## 2017-12-07 ENCOUNTER — Emergency Department
Admission: EM | Admit: 2017-12-07 | Discharge: 2017-12-07 | Disposition: A | Discharge status: Home / Self Care | Payer: Medicare Other | Attending: Emergency Medicine | Admitting: Emergency Medicine

## 2017-12-07 DIAGNOSIS — N3000 Acute cystitis without hematuria: Secondary | ICD-10-CM | POA: Diagnosis not present

## 2017-12-07 DIAGNOSIS — F70 Mild intellectual disabilities: Secondary | ICD-10-CM | POA: Insufficient documentation

## 2017-12-07 DIAGNOSIS — R300 Dysuria: Secondary | ICD-10-CM | POA: Diagnosis present

## 2017-12-07 LAB — URINALYSIS, COMPLETE (UACMP) WITH MICROSCOPIC
BACTERIA UA: NONE SEEN
Bilirubin Urine: NEGATIVE
Glucose, UA: NEGATIVE mg/dL
Ketones, ur: NEGATIVE mg/dL
NITRITE: NEGATIVE
PROTEIN: NEGATIVE mg/dL
SPECIFIC GRAVITY, URINE: 1.021 (ref 1.005–1.030)
pH: 5 (ref 5.0–8.0)

## 2017-12-07 LAB — POCT PREGNANCY, URINE: Preg Test, Ur: NEGATIVE

## 2017-12-07 MED ORDER — CEPHALEXIN 500 MG PO CAPS: 500.0000 mg | ORAL_CAPSULE | Freq: Two times a day (BID) | ORAL | 0 refills | Status: AC

## 2017-12-07 MED ORDER — CEPHALEXIN 500 MG PO CAPS
500.0000 mg | ORAL_CAPSULE | Freq: Once | ORAL | Status: AC
  Administered 2017-12-07: 500 mg via ORAL
  Filled 2017-12-07: qty 1

## 2017-12-07 NOTE — Discharge Instructions (Addendum)
Take the antibiotic as directed. Follow-up with the primary care provider as needed. Drink cranberry juice and empty your bladder on schedule.

## 2017-12-07 NOTE — ED Provider Notes (Signed)
[facility] Emergency Department Provider Note ____________________________________________  Time seen: 1558  I have reviewed the triage vital signs and the nursing notes.  HISTORY  Chief Complaint  Dysuria  HPI [name] is a 27 y.o. female presents to the ED for evaluation of dysuria for the last 3 days.  Patient describes symptoms are familiar to her in terms of UTI she had in the past.  She denies any fevers, chills, or sweats.  She also denies any nausea, vomiting, pelvic pain or vaginal discharge.  Past Medical History:  Diagnosis Date  . Herpes labialis   . Mild mental retardation     Patient Active Problem List   Diagnosis Date Noted  . Sepsis (HCC) 11/27/2015  . Pyelonephritis 11/27/2015  . AKI (acute kidney injury) (HCC) 11/27/2015    Past Surgical History:  Procedure Laterality Date  . CESAREAN SECTION      Prior to Admission medications   Medication Sig Start Date End Date Taking? Authorizing Provider  cephALEXin (KEFLEX) 500 MG capsule Take 1 capsule (500 mg total) by mouth 2 (two) times daily for 7 days. 12/07/17 12/14/17  Ralene Gasparyan, Charlesetta IvoryJenise V Bacon, PA-C   Allergies Patient has no known allergies.  No family history on file.  Social History Social History   Tobacco Use  . Smoking status: Never Smoker  . Smokeless tobacco: Never Used  Substance Use Topics  . Alcohol use: No  . Drug use: No    Review of Systems  Constitutional: Negative for fever. Cardiovascular: Negative for chest pain. Respiratory: Negative for shortness of breath. Gastrointestinal: Negative for abdominal pain, vomiting and diarrhea. Genitourinary: Positive for dysuria. Musculoskeletal: Negative for back pain. Skin: Negative for rash. Neurological: Negative for headaches, focal weakness or numbness. ____________________________________________  PHYSICAL EXAM:  VITAL SIGNS: ED Triage Vitals  Enc Vitals Group     BP 12/07/17 1304 128/78   Pulse Rate 12/07/17 1304 79     Resp 12/07/17 1304 18     Temp 12/07/17 1304 99.1 F (37.3 C)     Temp Source 12/07/17 1304 Oral     SpO2 12/07/17 1304 99 %     Weight 12/07/17 1304 170 lb (77.1 kg)     Height 12/07/17 1304 5\' 4"  (1.626 m)     Head Circumference --      Peak Flow --      Pain Score 12/07/17 1310 9     Pain Loc --      Pain Edu? --      Excl. in GC? --     Constitutional: Alert and oriented. Well appearing and in no distress. Head: Normocephalic and atraumatic. Cardiovascular: Normal rate, regular rhythm. Normal distal pulses. Respiratory: Normal respiratory effort. No wheezes/rales/rhonchi. Gastrointestinal: Soft and nontender. No distention.  No CVA tenderness appreciated. _______________________________________________   LABS (pertinent positives/negatives)  Labs Reviewed  URINALYSIS, COMPLETE (UACMP) WITH MICROSCOPIC - Abnormal; Notable for the following components:      Result Value   Color, Urine YELLOW (*)    APPearance CLOUDY (*)    Hgb urine dipstick SMALL (*)    Leukocytes, UA LARGE (*)    Squamous Epithelial / LPF 0-5 (*)    All other components within normal limits  URINE CULTURE  POCT PREGNANCY, URINE  ____________________________________________  PROCEDURES  Procedures Keflex 500 mg PO ____________________________________________  INITIAL IMPRESSION / ASSESSMENT AND PLAN / ED COURSE  Patient presents to the ED for evaluation of dysuria. Her analysis confirms an acute cystitis  without hematuria.  Urine culture is pending at the time of discharge.  Patient will be discharged with a prescription for Keflex to dose as directed.  She is encouraged to continue to drink noncarbonated drinks an anterolateral schedule.  She will follow-up with her primary provider or return as needed. ____________________________________________  FINAL CLINICAL IMPRESSION(S) / ED DIAGNOSES  Final diagnoses:  Acute cystitis without hematuria      Karmen Stabs,  Charlesetta Ivory, PA-C 12/07/17 1950    Jeanmarie Plant, MD 12/07/17 2056

## 2017-12-07 NOTE — ED Notes (Signed)
See triage note  Presents with 3 day hx of urinary freq and burning  ..states she only voiding small amts at a time

## 2017-12-07 NOTE — ED Triage Notes (Signed)
Pt states that for 3 days she has been having trouble urinating and pain.  Pt states she has had this before and it has been a UTI.  Pt is A&Ox4, in NAD.

## 2017-12-07 NOTE — ED Notes (Signed)
Pts sister Jamie KatoCharlita- legal guardian notified of pts discharge.

## 2017-12-09 LAB — URINE CULTURE: SPECIAL REQUESTS: NORMAL

## 2017-12-26 ENCOUNTER — Emergency Department
Admission: EM | Admit: 2017-12-26 | Discharge: 2017-12-27 | Disposition: A | Discharge status: Home / Self Care | Payer: Medicare Other | Attending: Student in an Organized Health Care Education/Training Program | Admitting: Student in an Organized Health Care Education/Training Program

## 2017-12-26 ENCOUNTER — Other Ambulatory Visit: Payer: Self-pay

## 2017-12-26 ENCOUNTER — Encounter: Payer: Self-pay | Admitting: *Deleted

## 2017-12-26 DIAGNOSIS — R51 Headache: Secondary | ICD-10-CM | POA: Diagnosis present

## 2017-12-26 DIAGNOSIS — Z9109 Other allergy status, other than to drugs and biological substances: Secondary | ICD-10-CM | POA: Insufficient documentation

## 2017-12-26 NOTE — ED Triage Notes (Signed)
Patient c/o headache. Patient is here with her son who is being treated. Patient c/o nasal congestion. Patient c/o productive cough with green mucous.

## 2017-12-27 MED ORDER — LORATADINE 10 MG PO TABS: 10.0000 mg | ORAL_TABLET | Freq: Every day | ORAL | 0 refills | Status: DC

## 2017-12-27 NOTE — ED Provider Notes (Signed)
[facility] Emergency Department Provider Note    First MD Initiated Contact with Patient 12/27/17 0000     (approximate)  I have reviewed the triage vital signs and the nursing notes.   HISTORY  Chief Complaint Headache    HPI [name] is a 27 y.o. female resents to the ER for several days of scratchy throat intermittent headache and nasal congestion.  She checked him because she is bring her son in for seasonal allergies and thinks that she has similar symptoms.  States her symptoms are mild.  Denies any fevers.  No chest pain or shortness of breath.  Past Medical History:  Diagnosis Date  . Herpes labialis   . Mild mental retardation    No family history on file. Past Surgical History:  Procedure Laterality Date  . CESAREAN SECTION     Patient Active Problem List   Diagnosis Date Noted  . Sepsis (HCC) 11/27/2015  . Pyelonephritis 11/27/2015  . AKI (acute kidney injury) (HCC) 11/27/2015      Prior to Admission medications   Medication Sig Start Date End Date Taking? Authorizing Provider  loratadine (CLARITIN) 10 MG tablet Take 1 tablet (10 mg total) by mouth daily. 12/27/17   Willy Eddyobinson, Starletta Houchin, MD    Allergies Patient has no known allergies.    Social History Social History   Tobacco Use  . Smoking status: Never Smoker  . Smokeless tobacco: Never Used  Substance Use Topics  . Alcohol use: No  . Drug use: No    Review of Systems Patient denies headaches, rhinorrhea, blurry vision, numbness, shortness of breath, chest pain, edema, cough, abdominal pain, nausea, vomiting, diarrhea, dysuria, fevers, rashes or hallucinations unless otherwise stated above in HPI. ____________________________________________   PHYSICAL EXAM:  VITAL SIGNS: Vitals:   12/26/17 2340  BP: 113/77  Pulse: 73  Resp: 18  Temp: 97.9 F (36.6 C)  SpO2: 99%    Constitutional: Alert and oriented. Well appearing and in no acute distress. Eyes:  Conjunctivae are normal.  Head: Atraumatic. Nose: No congestion/rhinnorhea. Mouth/Throat: Mucous membranes are moist.   Neck: Painless ROM.  Cardiovascular:   Good peripheral circulation. Respiratory: Normal respiratory effort.  No retractions.  Gastrointestinal: Soft and nontender.  Musculoskeletal: No lower extremity tenderness .  No joint effusions. Neurologic:  Normal speech and language. No gross focal neurologic deficits are appreciated.  Skin:  Skin is warm, dry and intact. No rash noted. Psychiatric: Mood and affect are normal. Speech and behavior are normal.  ____________________________________________   LABS (all labs ordered are listed, but only abnormal results are displayed)  No results found for this or any previous visit (from the past 24 hour(s)). ____________________________________________ ____________________________________________   PROCEDURES  Procedure(s) performed:  Procedures    Critical Care performed: no ____________________________________________   INITIAL IMPRESSION / ASSESSMENT AND PLAN / ED COURSE  Pertinent labs & imaging results that were available during my care of the patient were reviewed by me and considered in my medical decision making (see chart for details).  DDX: Rhinitis, pharyngitis, seasonal allergies  Shelley MoreSherica S Fryer is a 27 y.o. who presents to the ED with symptoms as described above.  Patient well-appearing in no acute distress.  Do not feel that further diagnostic imaging or laboratory testing clinically indicated.  Will give prescription for Claritin as a trial of outpatient observation.  Discussed signs and symptoms for which she should return to the ER.      ____________________________________________   FINAL CLINICAL  IMPRESSION(S) / ED DIAGNOSES  Final diagnoses:  Environmental allergies      NEW MEDICATIONS STARTED DURING THIS VISIT:  New Prescriptions   LORATADINE (CLARITIN) 10 MG TABLET    Take 1  tablet (10 mg total) by mouth daily.     Note:  This document was prepared using Dragon voice recognition software and may include unintentional dictation errors.     Willy Eddy, MD 12/27/17 [phone]

## 2017-12-27 NOTE — ED Notes (Signed)
Pt reports sinus HA over the last couple of days and productive cough (green), no cough present on assessment  Pt reports hx of seasonal allergies but not taking anything from that

## 2017-12-27 NOTE — ED Notes (Addendum)
Legal guardian called but number is wrong for cell phone and work/home number disconnected  Dr Roxan Hockeyobinson and Clint LippsLea, charge notified

## 2017-12-27 NOTE — ED Notes (Addendum)
Pt requesting meds for allergy type s/sx --  Green drainage, HA, itchy throat

## 2017-12-27 NOTE — ED Notes (Addendum)
Mother (Ms Janee Mornhompson) to pt, legal guardian, notified Cell phone is [phone]   Discharge info and prescriptions reviewed with mother, (legal guardian) and pt, mother is picking up pt

## 2018-02-04 ENCOUNTER — Emergency Department
Admission: EM | Admit: 2018-02-04 | Discharge: 2018-02-04 | Disposition: A | Discharge status: Home / Self Care | Payer: Medicare Other | Attending: Emergency Medicine | Admitting: Emergency Medicine

## 2018-02-04 ENCOUNTER — Encounter: Payer: Self-pay | Admitting: Emergency Medicine

## 2018-02-04 ENCOUNTER — Emergency Department: Payer: Medicare Other

## 2018-02-04 ENCOUNTER — Other Ambulatory Visit: Payer: Self-pay

## 2018-02-04 DIAGNOSIS — F70 Mild intellectual disabilities: Secondary | ICD-10-CM | POA: Diagnosis not present

## 2018-02-04 DIAGNOSIS — L0231 Cutaneous abscess of buttock: Secondary | ICD-10-CM

## 2018-02-04 DIAGNOSIS — J069 Acute upper respiratory infection, unspecified: Secondary | ICD-10-CM | POA: Insufficient documentation

## 2018-02-04 DIAGNOSIS — Z79899 Other long term (current) drug therapy: Secondary | ICD-10-CM | POA: Insufficient documentation

## 2018-02-04 DIAGNOSIS — R05 Cough: Secondary | ICD-10-CM | POA: Diagnosis present

## 2018-02-04 LAB — COMPREHENSIVE METABOLIC PANEL
ALBUMIN: 4 g/dL (ref 3.5–5.0)
ALT: 22 U/L (ref 14–54)
AST: 24 U/L (ref 15–41)
Alkaline Phosphatase: 66 U/L (ref 38–126)
Anion gap: 7 (ref 5–15)
BUN: 18 mg/dL (ref 6–20)
CO2: 25 mmol/L (ref 22–32)
Calcium: 9.6 mg/dL (ref 8.9–10.3)
Chloride: 106 mmol/L (ref 101–111)
Creatinine, Ser: 0.72 mg/dL (ref 0.44–1.00)
GFR calc non Af Amer: >60 mL/min (ref >60)
Glucose, Bld: 107 mg/dL — ABNORMAL HIGH (ref 65–99)
Potassium: 4.1 mmol/L (ref 3.5–5.1)
SODIUM: 138 mmol/L (ref 135–145)
Total Bilirubin: 0.6 mg/dL (ref 0.3–1.2)
Total Protein: 8.2 g/dL — ABNORMAL HIGH (ref 6.5–8.1)

## 2018-02-04 LAB — CBC
HCT: 35.9 % (ref 35.0–47.0)
Hemoglobin: 12 g/dL (ref 12.0–16.0)
MCH: 26.9 pg (ref 26.0–34.0)
MCHC: 33.5 g/dL (ref 32.0–36.0)
MCV: 80.2 fL (ref 80.0–100.0)
PLATELETS: 373 10*3/uL (ref 150–440)
RBC: 4.48 MIL/uL (ref 3.80–5.20)
RDW: 14 % (ref 11.5–14.5)
WBC: 8.3 10*3/uL (ref 3.6–11.0)

## 2018-02-04 MED ORDER — SULFAMETHOXAZOLE-TRIMETHOPRIM 800-160 MG PO TABS: 1.0000 | ORAL_TABLET | Freq: Two times a day (BID) | ORAL | 0 refills | Status: DC

## 2018-02-04 MED ORDER — ALBUTEROL SULFATE (2.5 MG/3ML) 0.083% IN NEBU: 5.0000 mg | INHALATION_SOLUTION | Freq: Once | RESPIRATORY_TRACT | Status: DC

## 2018-02-04 MED ORDER — PSEUDOEPH-BROMPHEN-DM 30-2-10 MG/5ML PO SYRP: 5.0000 mL | ORAL_SOLUTION | Freq: Four times a day (QID) | ORAL | 0 refills | Status: DC | PRN

## 2018-02-04 NOTE — ED Provider Notes (Signed)
[facility] Emergency Department Provider Note  ____________________________________________   First MD Initiated Contact with Patient 02/04/18 1259     (approximate)  I have reviewed the triage vital signs and the nursing notes.   HISTORY  Chief Complaint Cough and Abscess   HPI [name] is a 27 y.o. female is here with complaint of cough and congestion for 1 week.  Patient states that she coughed up a small amount of bright red blood this morning.  She states she also had a nosebleed last night.  She denies any fever, chills, nausea or vomiting.  She has not been taking any over-the-counter medication for this.  She also reports an abscess near her vagina for the last 4 weeks.  She is not been using any over-the-counter products or soaking in a tub for this.  She states she has not had any abscesses in the past.  Currently she rates her pain as a 10/10.  Past Medical History:  Diagnosis Date  . Herpes labialis   . Mild mental retardation     Patient Active Problem List   Diagnosis Date Noted  . Sepsis (HCC) 11/27/2015  . Pyelonephritis 11/27/2015  . AKI (acute kidney injury) (HCC) 11/27/2015    Past Surgical History:  Procedure Laterality Date  . CESAREAN SECTION      Prior to Admission medications   Medication Sig Start Date End Date Taking? Authorizing Provider  brompheniramine-pseudoephedrine-DM 30-2-10 MG/5ML syrup Take 5 mLs by mouth 4 (four) times daily as needed. 02/04/18   Tommi Rumps, PA-C  loratadine (CLARITIN) 10 MG tablet Take 1 tablet (10 mg total) by mouth daily. 12/27/17   Willy Eddy, MD  sulfamethoxazole-trimethoprim (BACTRIM DS,SEPTRA DS) 800-160 MG tablet Take 1 tablet by mouth 2 (two) times daily. 02/04/18   Tommi Rumps, PA-C    Allergies Patient has no known allergies.  No family history on file.  Social History Social History   Tobacco Use  . Smoking status: Never Smoker  . Smokeless tobacco:  Never Used  Substance Use Topics  . Alcohol use: No  . Drug use: No    Review of Systems Constitutional: No fever/chills Eyes: No visual changes. ENT: Positive resolved nosebleed. Cardiovascular: Denies chest pain. Respiratory: Denies shortness of breath.  Positive cough.  Positive hemoptysis. Gastrointestinal: No abdominal pain.  No nausea, no vomiting.   Musculoskeletal: Negative for back pain. Skin: Positive possible abscess. Neurological: Negative for headaches, focal weakness or numbness. ____________________________________________   PHYSICAL EXAM:  VITAL SIGNS: ED Triage Vitals  Enc Vitals Group     BP 02/04/18 1113 (!) 132/94     Pulse --      Resp 02/04/18 1113 20     Temp 02/04/18 1113 99 F (37.2 C)     Temp Source 02/04/18 1113 Oral     SpO2 02/04/18 1113 99 %     Weight 02/04/18 1114 178 lb (80.7 kg)     Height 02/04/18 1114  (1.626 m)     Head Circumference --      Peak Flow --      Pain Score 02/04/18 1114 10     Pain Loc --      Pain Edu? --      Excl. in GC? --    Constitutional: Alert and oriented. Well appearing and in no acute distress. Eyes: Conjunctivae are normal.  Head: Atraumatic. Nose: Mild congestion/rhinnorhea.  No active bleeding at this time. Mouth/Throat: Mucous membranes are  moist.  Oropharynx non-erythematous.  No evidence of posterior bleed. Neck: No stridor.   Hematological/Lymphatic/Immunilogical: No cervical lymphadenopathy. Cardiovascular: Normal rate, regular rhythm. Grossly normal heart sounds.  Good peripheral circulation. Respiratory: Normal respiratory effort.  No retractions. Lungs CTAB. Gastrointestinal: Soft and nontender. No distention. Musculoskeletal: Moves upper and lower extremities without any difficulty.  Normal gait was noted. Neurologic:  Normal speech and language. No gross focal neurologic deficits are appreciated. Skin:  Skin is warm, dry.  There is a resolving abscess on the medial right buttocks that  continues to be tender to palpation.  No drainage is present.  No erythema or warmth.  No induration. Psychiatric: Mood and affect are normal. Speech and behavior are normal.  ____________________________________________   LABS (all labs ordered are listed, but only abnormal results are displayed)  Labs Reviewed  COMPREHENSIVE METABOLIC PANEL - Abnormal; Notable for the following components:      Result Value   Glucose, Bld 107 (*)    Total Protein 8.2 (*)    All other components within normal limits  CBC    RADIOLOGY  ED MD interpretation:   Chest x-ray is negative for infiltrate.  Official radiology report(s): Dg Chest 2 View  Result Date: 02/04/2018 CLINICAL DATA:  Blood-tinged sputum for 1 week EXAM: CHEST - 2 VIEW COMPARISON:  07/26/2017 FINDINGS: The heart size and mediastinal contours are within normal limits. Both lungs are clear. The visualized skeletal structures are unremarkable. IMPRESSION: No active cardiopulmonary disease. Electronically Signed   By: Alcide Clever M.D.   On: 02/04/2018 11:53    ____________________________________________   PROCEDURES  Procedure(s) performed: None  Procedures  Critical Care performed: No  ____________________________________________   INITIAL IMPRESSION / ASSESSMENT AND PLAN / ED COURSE  As part of my medical decision making, I reviewed the following data within the electronic MEDICAL RECORD NUMBER Notes from prior ED visits and [place] Controlled Substance Database  Patient was reassured that nosebleed has resolved.  Chest x-ray was reassuring.  Patient was placed on Bactrim DS twice daily for 10 days for her resolving abscess to her buttocks.  Also she was given a prescription for Bromfed-DM 4 times daily as needed for cough and congestion.  She is encouraged to increase fluids.  She will follow-up with her PCP for any continued health problems.  At this time she does not have a headache and was encouraged to see her PCP for  evaluation.  ____________________________________________   FINAL CLINICAL IMPRESSION(S) / ED DIAGNOSES  Final diagnoses:  Upper respiratory tract infection, unspecified type  Abscess of buttock, right     ED Discharge Orders        Ordered    sulfamethoxazole-trimethoprim (BACTRIM DS,SEPTRA DS) 800-160 MG tablet  2 times daily     02/04/18 1346    brompheniramine-pseudoephedrine-DM 30-2-10 MG/5ML syrup  4 times daily PRN     02/04/18 1346       Note:  This document was prepared using Dragon voice recognition software and may include unintentional dictation errors.    Tommi Rumps, PA-C 02/04/18 1428    Rockne Menghini, MD 02/04/18 [phone]

## 2018-02-04 NOTE — ED Notes (Signed)
First Nurse Note: Patient states she is coughing "up blood", bright red small amount.  Patient states she had a nosebleed last night.  No bleeding visible.  NAD.

## 2018-02-04 NOTE — ED Notes (Signed)
See triage note  States she has had cough for the past 3-4 days   Stats she noticed some blood when she coughed  No fever or SOB  Also is complaining of a possible abscess area to buttocks/groin

## 2018-02-04 NOTE — ED Triage Notes (Signed)
Pt reports has been coughing up blood for 3-4 days. Pt also reports has been getting migraines a lot and has an abscess on her vagina. Pt reports the abscess has been present for 4 weeks.

## 2018-02-04 NOTE — Discharge Instructions (Addendum)
Call make an appointment with your primary care doctor at [facility] for reevaluation of your abscess and to listen to your lungs.  Begin taking Bromfed-DM as needed for cough and congestion.  Also Bactrim DS twice daily for 10 days.  You should begin using warm compresses to your buttocks or soak in a warm tub of water twice a day for the next 3 to 4 days.

## 2018-03-16 DIAGNOSIS — Z79899 Other long term (current) drug therapy: Secondary | ICD-10-CM | POA: Insufficient documentation

## 2018-03-16 DIAGNOSIS — F70 Mild intellectual disabilities: Secondary | ICD-10-CM | POA: Diagnosis not present

## 2018-03-16 DIAGNOSIS — J029 Acute pharyngitis, unspecified: Secondary | ICD-10-CM | POA: Diagnosis present

## 2018-03-16 DIAGNOSIS — J069 Acute upper respiratory infection, unspecified: Secondary | ICD-10-CM | POA: Insufficient documentation

## 2018-03-16 NOTE — ED Triage Notes (Signed)
Patient ambulatory to triage with steady gait, without difficulty or distress noted; pt reports sore throat today with HA to temples, prod cough clear sputum

## 2018-03-17 ENCOUNTER — Encounter: Payer: Self-pay | Admitting: Emergency Medicine

## 2018-03-17 ENCOUNTER — Emergency Department: Payer: Medicare Other

## 2018-03-17 ENCOUNTER — Other Ambulatory Visit: Payer: Self-pay

## 2018-03-17 ENCOUNTER — Emergency Department
Admission: EM | Admit: 2018-03-17 | Discharge: 2018-03-17 | Disposition: A | Discharge status: Home / Self Care | Payer: Medicare Other | Attending: Emergency Medicine | Admitting: Emergency Medicine

## 2018-03-17 DIAGNOSIS — J069 Acute upper respiratory infection, unspecified: Secondary | ICD-10-CM | POA: Diagnosis not present

## 2018-03-17 LAB — GROUP A STREP BY PCR: Group A Strep by PCR: NOT DETECTED

## 2018-03-17 LAB — POCT PREGNANCY, URINE: Preg Test, Ur: NEGATIVE

## 2018-03-17 MED ORDER — BENZONATATE 100 MG PO CAPS: 100.0000 mg | ORAL_CAPSULE | Freq: Three times a day (TID) | ORAL | 0 refills | Status: DC | PRN

## 2018-03-17 MED ORDER — IBUPROFEN 600 MG PO TABS
ORAL_TABLET | ORAL | Status: AC
  Filled 2018-03-17: qty 1

## 2018-03-17 MED ORDER — IBUPROFEN 800 MG PO TABS
800.0000 mg | ORAL_TABLET | Freq: Once | ORAL | Status: AC
  Administered 2018-03-17: 800 mg via ORAL
  Filled 2018-03-17: qty 1

## 2018-03-17 MED ORDER — HYDROCOD POLST-CPM POLST ER 10-8 MG/5ML PO SUER
5.0000 mL | Freq: Once | ORAL | Status: AC
  Administered 2018-03-17: 5 mL via ORAL
  Filled 2018-03-17: qty 5

## 2018-03-17 NOTE — ED Notes (Signed)
Pt Legal Guardian [name] notified of discharge.

## 2018-03-17 NOTE — ED Provider Notes (Signed)
[facility] Emergency Department Provider Note ____________________________   First MD Initiated Contact with Patient 03/17/18 0020     (approximate)  I have reviewed the triage vital signs and the nursing notes.   HISTORY  Chief Complaint Sore Throat    HPI [name] is a 27 y.o. female with below list of chronic medical conditions presents to the emergency department with sore throat and nonproductive cough with onset today.  Patient also admits to headache however no headache at present.  Patient states her current pain score is 6 out of 10.  Past Medical History:  Diagnosis Date  . Herpes labialis   . Mild mental retardation     Patient Active Problem List   Diagnosis Date Noted  . Sepsis (HCC) 11/27/2015  . Pyelonephritis 11/27/2015  . AKI (acute kidney injury) (HCC) 11/27/2015    Past Surgical History:  Procedure Laterality Date  . CESAREAN SECTION      Prior to Admission medications   Medication Sig Start Date End Date Taking? Authorizing Provider  brompheniramine-pseudoephedrine-DM 30-2-10 MG/5ML syrup Take 5 mLs by mouth 4 (four) times daily as needed. 02/04/18   Tommi RumpsSummers, Rhonda L, PA-C  loratadine (CLARITIN) 10 MG tablet Take 1 tablet (10 mg total) by mouth daily. 12/27/17   Willy Eddyobinson, Patrick, MD  sulfamethoxazole-trimethoprim (BACTRIM DS,SEPTRA DS) 800-160 MG tablet Take 1 tablet by mouth 2 (two) times daily. 02/04/18   Tommi RumpsSummers, Rhonda L, PA-C    Allergies No known drug allergies No family history on file.  Social History Social History   Tobacco Use  . Smoking status: Never Smoker  . Smokeless tobacco: Never Used  Substance Use Topics  . Alcohol use: No  . Drug use: No    Review of Systems Constitutional: No fever/chills Eyes: No visual changes. ENT: Positive for sore throat. Cardiovascular: Denies chest pain. Respiratory: Denies shortness of breath.  Positive for nonproductive cough Gastrointestinal: No  abdominal pain.  No nausea, no vomiting.  No diarrhea.  No constipation. Genitourinary: Negative for dysuria. Musculoskeletal: Negative for neck pain.  Negative for back pain. Integumentary: Negative for rash. Neurological: Negative for headaches, focal weakness or numbness.   ____________________________________________   PHYSICAL EXAM:  VITAL SIGNS: ED Triage Vitals  Enc Vitals Group     BP 03/17/18 0007 120/71     Pulse Rate 03/17/18 0007 81     Resp 03/17/18 0007 20     Temp 03/17/18 0007 98.3 F (36.8 C)     Temp Source 03/17/18 0007 Oral     SpO2 03/17/18 0007 100 %     Weight 03/17/18 0000 83.9 kg (185 lb)     Height 03/17/18 0000 1.626 m (5\' 4" )     Head Circumference --      Peak Flow --      Pain Score 03/17/18 0000 10     Pain Loc --      Pain Edu? --      Excl. in GC? --     Constitutional: Alert and oriented. Well appearing and in no acute distress. Eyes: Conjunctivae are normal. Head: Atraumatic. Mouth/Throat: Mucous membranes are moist.  Oropharynx non-erythematous. Neck: No stridor.  Cardiovascular: Normal rate, regular rhythm. Good peripheral circulation. Grossly normal heart sounds. Respiratory: Normal respiratory effort.  No retractions. Lungs CTAB. Gastrointestinal: Soft and nontender. No distention.  Musculoskeletal: No lower extremity tenderness nor edema. No gross deformities of extremities. Neurologic:  Normal speech and language. No gross focal neurologic deficits are appreciated.  Skin:  Skin is warm, dry and intact. No rash noted. Psychiatric: Mood and affect are normal. Speech and behavior are normal.  ____________________________________________   LABS (all labs ordered are listed, but only abnormal results are displayed)  Labs Reviewed  GROUP A STREP BY PCR  POCT PREGNANCY, URINE   ______________  RADIOLOGY I, Eclectic N Shan Valdes, personally viewed and evaluated these images (plain radiographs) as part of my medical decision making,  as well as reviewing the written report by the radiologist.  ED MD interpretation: No acute cardiopulmonary findings on x-ray per radiologist  Official radiology report(s): Dg Chest 2 View  Result Date: 03/17/2018 CLINICAL DATA:  Sore throat with headache EXAM: CHEST - 2 VIEW COMPARISON:  None. FINDINGS: The heart size and mediastinal contours are within normal limits. Both lungs are clear. The visualized skeletal structures are unremarkable. IMPRESSION: No active cardiopulmonary disease. Electronically Signed   By: Jasmine Pang M.D.   On: 03/17/2018 00:59      Procedures   ____________________________________________   INITIAL IMPRESSION / ASSESSMENT AND PLAN / ED COURSE  As part of my medical decision making, I reviewed the following data within the electronic MEDICAL RECORD NUMBER   27 year old female presenting with above-stated history and physical exam secondary to cough and sore throat.  She is afebrile physical exam revealed no acute findings.  Laboratory data revealed no evidence of strep chest x-ray likewise negative.  Suspect viral URI patient given Topamax in the emergency department will be prescribed Tessalon Perles for home.  Spoke with patient regarding warning signs that would warrant return to the emergency department.    ____________________________________________  FINAL CLINICAL IMPRESSION(S) / ED DIAGNOSES  Final diagnoses:  Upper respiratory tract infection, unspecified type     MEDICATIONS GIVEN DURING THIS VISIT:  Medications  ibuprofen (ADVIL,MOTRIN) 600 MG tablet (has no administration in time range)  chlorpheniramine-HYDROcodone (TUSSIONEX) 10-8 MG/5ML suspension 5 mL (5 mLs Oral Given 03/17/18 0117)  ibuprofen (ADVIL,MOTRIN) tablet 800 mg (800 mg Oral Given 03/17/18 0137)     ED Discharge Orders    None       Note:  This document was prepared using Dragon voice recognition software and may include unintentional dictation errors.    Darci Current, MD 03/17/18 2330

## 2018-03-17 NOTE — ED Notes (Signed)
Pt states that she has been having a sore throat since this morning. Pt states that she had pneumonia in the past and feels similar to it. Pt states that she has not taken any medications prior to coming to the ED. No distress noted. Pt AxOx4.

## 2018-06-18 ENCOUNTER — Other Ambulatory Visit: Payer: Self-pay

## 2018-06-18 ENCOUNTER — Encounter: Payer: Self-pay | Admitting: Emergency Medicine

## 2018-06-18 ENCOUNTER — Emergency Department: Payer: Medicare Other

## 2018-06-18 ENCOUNTER — Emergency Department
Admission: EM | Admit: 2018-06-18 | Discharge: 2018-06-18 | Disposition: A | Discharge status: Home / Self Care | Payer: Medicare Other | Attending: Emergency Medicine | Admitting: Emergency Medicine

## 2018-06-18 DIAGNOSIS — L03115 Cellulitis of right lower limb: Secondary | ICD-10-CM | POA: Diagnosis not present

## 2018-06-18 DIAGNOSIS — R2241 Localized swelling, mass and lump, right lower limb: Secondary | ICD-10-CM | POA: Diagnosis present

## 2018-06-18 MED ORDER — CEPHALEXIN 500 MG PO CAPS: 500.0000 mg | ORAL_CAPSULE | Freq: Three times a day (TID) | ORAL | 0 refills | Status: DC

## 2018-06-18 MED ORDER — MELOXICAM 15 MG PO TABS: 15.0000 mg | ORAL_TABLET | Freq: Every day | ORAL | 0 refills | Status: DC

## 2018-06-18 NOTE — ED Triage Notes (Signed)
Pt presents to ED via POV with her mother who is her legal guardian. Pt c/o R leg swelling intermittently x 1 week. Pt states swelling from R foot to R hip.

## 2018-06-18 NOTE — Discharge Instructions (Addendum)
Follow-up with your regular doctor if not better in 5 7 days.  Return emergency department if worsening.  Take medication as prescribed.

## 2018-06-18 NOTE — ED Provider Notes (Signed)
[facility] Emergency Department Provider Note  ____________________________________________   First MD Initiated Contact with Patient 06/18/18 1451     (approximate)  I have reviewed the triage vital signs and the nursing notes.   HISTORY  Chief Complaint Leg Swelling    HPI [name] is a 27 y.o. female presents emergency department complaining of right leg swelling for 1 week.  She states that started office sore on top of her foot now the entire leg is sore and swollen.  She states that she has pain all the way up to her hip.  She denies fever or chills.    Past Medical History:  Diagnosis Date  . Herpes labialis   . Mild mental retardation     Patient Active Problem List   Diagnosis Date Noted  . Sepsis (HCC) 11/27/2015  . Pyelonephritis 11/27/2015  . AKI (acute kidney injury) (HCC) 11/27/2015    Past Surgical History:  Procedure Laterality Date  . CESAREAN SECTION      Prior to Admission medications   Medication Sig Start Date End Date Taking? Authorizing Provider  cephALEXin (KEFLEX) 500 MG capsule Take 1 capsule (500 mg total) by mouth 3 (three) times daily. 06/18/18   Rashied Corallo, Roselyn Bering, PA-C  meloxicam (MOBIC) 15 MG tablet Take 1 tablet (15 mg total) by mouth daily. 06/18/18   Faythe Ghee, PA-C    Allergies Patient has no known allergies.  History reviewed. No pertinent family history.  Social History Social History   Tobacco Use  . Smoking status: Never Smoker  . Smokeless tobacco: Never Used  Substance Use Topics  . Alcohol use: No  . Drug use: No    Review of Systems  Constitutional: No fever/chills Eyes: No visual changes. ENT: No sore throat. Respiratory: Denies cough Genitourinary: Negative for dysuria. Musculoskeletal: Negative for back pain.  Positive for right leg pain Skin: Negative for rash.    ____________________________________________   PHYSICAL EXAM:  VITAL SIGNS: ED Triage Vitals    Enc Vitals Group     BP 06/18/18 1424 126/87     Pulse Rate 06/18/18 1424 82     Resp --      Temp 06/18/18 1424 98.1 F (36.7 C)     Temp Source 06/18/18 1424 Oral     SpO2 06/18/18 1424 98 %     Weight 06/18/18 1424 190 lb (86.2 kg)     Height 06/18/18 1424 5\' 4"  (1.626 m)     Head Circumference --      Peak Flow --      Pain Score 06/18/18 1438 10     Pain Loc --      Pain Edu? --      Excl. in GC? --     Constitutional: Alert and oriented. Well appearing and in no acute distress. Eyes: Conjunctivae are normal.  Head: Atraumatic. Nose: No congestion/rhinnorhea. Mouth/Throat: Mucous membranes are moist.   Neck:  supple no lymphadenopathy noted Cardiovascular: Normal rate, regular rhythm. Heart sounds are normal Respiratory: Normal respiratory effort.  No retractions, lungs c t a  GU: deferred Musculoskeletal: FROM all extremities, warm and well perfused.  Some redness is noted above the right knee.  Feels warm to touch.  The calf is tender to palpation.  The knee is tender to palpation.  Negative Homans sign. Neurologic:  Normal speech and language.  Skin:  Skin is warm, dry and intact. No rash noted. Psychiatric: Mood and affect are normal. Speech  and behavior are normal.  ____________________________________________   LABS (all labs ordered are listed, but only abnormal results are displayed)  Labs Reviewed - No data to display ____________________________________________   ____________________________________________  RADIOLOGY  X-ray of the right knee is negative for any acute abnormality Ultrasound of the right leg is negative for DVT  ____________________________________________   PROCEDURES  Procedure(s) performed: No  Procedures    ____________________________________________   INITIAL IMPRESSION / ASSESSMENT AND PLAN / ED COURSE  Pertinent labs & imaging results that were available during my care of the patient were reviewed by me and  considered in my medical decision making (see chart for details).   Patient is 27 year old female presents emergency department complaining of right leg pain.  On physical exam the right knee has a warm area above the patella.  Area is slightly red.  Neurovascular is intact.  The calf is tender.  X-ray of the right knee is negative Ultrasound of the right lower leg is negative for DVT  Explained all the findings to the patient.  Phoned her mother if she is a legal guardian to discuss the results and the treatment plan with her.  She states they will comply with the treatment plan.  The patient was discharged in stable condition.  She is to return if worsening.      As part of my medical decision making, I reviewed the following data within the electronic MEDICAL RECORD NUMBER Nursing notes reviewed and incorporated, Old chart reviewed, Radiograph reviewed x-ray of the right knee is negative, ultrasound right lower leg is negative for DVT, Notes from prior ED visits and [place] Controlled Substance Database  ____________________________________________   FINAL CLINICAL IMPRESSION(S) / ED DIAGNOSES  Final diagnoses:  Cellulitis of knee, right      NEW MEDICATIONS STARTED DURING THIS VISIT:  New Prescriptions   CEPHALEXIN (KEFLEX) 500 MG CAPSULE    Take 1 capsule (500 mg total) by mouth 3 (three) times daily.   MELOXICAM (MOBIC) 15 MG TABLET    Take 1 tablet (15 mg total) by mouth daily.     Note:  This document was prepared using Dragon voice recognition software and may include unintentional dictation errors.    Faythe Ghee, PA-C 06/18/18 1714    Jene Every, MD 06/19/18 2112

## 2018-06-18 NOTE — ED Notes (Signed)
r  Leg swollen  X  X  1  Week  Started off as a sore  On top  Of r  Foot   The  r  Foot is  Swollen and  Some  Swelling up r leg   Upwards toward the  High and r  Knee    No   Shortness of breath or  Chest pain

## 2018-08-22 ENCOUNTER — Other Ambulatory Visit: Payer: Self-pay

## 2018-08-22 ENCOUNTER — Encounter: Payer: Self-pay | Admitting: Emergency Medicine

## 2018-08-22 ENCOUNTER — Emergency Department: Payer: Medicare Other

## 2018-08-22 ENCOUNTER — Emergency Department
Admission: EM | Admit: 2018-08-22 | Discharge: 2018-08-22 | Disposition: A | Discharge status: Home / Self Care | Payer: Medicare Other | Attending: Emergency Medicine | Admitting: Emergency Medicine

## 2018-08-22 DIAGNOSIS — B9789 Other viral agents as the cause of diseases classified elsewhere: Secondary | ICD-10-CM

## 2018-08-22 DIAGNOSIS — R05 Cough: Secondary | ICD-10-CM | POA: Diagnosis present

## 2018-08-22 DIAGNOSIS — J069 Acute upper respiratory infection, unspecified: Secondary | ICD-10-CM | POA: Insufficient documentation

## 2018-08-22 MED ORDER — FLUTICASONE PROPIONATE 50 MCG/ACT NA SUSP: 2.0000 | Freq: Every day | NASAL | 0 refills | Status: DC

## 2018-08-22 MED ORDER — DEXAMETHASONE SODIUM PHOSPHATE 10 MG/ML IJ SOLN
10.0000 mg | Freq: Once | INTRAMUSCULAR | Status: AC
  Administered 2018-08-22: 10 mg via INTRAMUSCULAR
  Filled 2018-08-22: qty 1

## 2018-08-22 MED ORDER — PREDNISONE 10 MG PO TABS: 10.0000 mg | ORAL_TABLET | Freq: Two times a day (BID) | ORAL | 0 refills | Status: DC

## 2018-08-22 MED ORDER — BENZONATATE 100 MG PO CAPS: ORAL_CAPSULE | ORAL | 0 refills | Status: DC

## 2018-08-22 MED ORDER — AZITHROMYCIN 250 MG PO TABS: ORAL_TABLET | ORAL | 0 refills | Status: DC

## 2018-08-22 NOTE — ED Notes (Signed)
NAD noted at time of D/C. Pt denies questions or concerns. Pt ambulatory to the lobby at this time.  

## 2018-08-22 NOTE — ED Notes (Signed)
Pt c/o sore throat and cough x 2 weeks. NAD noted at this time.

## 2018-08-22 NOTE — Discharge Instructions (Signed)
You are being treated for bronchitis.Take the prescription meds as directed. Rest and hydrate. Follow-up with your provider for ongoing symptoms.

## 2018-08-22 NOTE — ED Provider Notes (Signed)
[facility] Emergency Department Provider Note ____________________________________________  Time seen: 1505  I have reviewed the triage vital signs and the nursing notes.  HISTORY  Chief Complaint  Cough and Sore Throat  HPI [name] is a 27 y.o. female who presents herself to the ED or evaluation of a 2-week complaint of non-productive cough. She denies any interim fevers, wheezing or cough-induced vomiting.  She has not taken any medications any preceding 2 weeks for her symptoms.  Denies sick contacts, recent travel, or other exposures.  Past Medical History:  Diagnosis Date  . Herpes labialis   . Mild mental retardation     Patient Active Problem List   Diagnosis Date Noted  . Sepsis (HCC) 11/27/2015  . Pyelonephritis 11/27/2015  . AKI (acute kidney injury) (HCC) 11/27/2015    Past Surgical History:  Procedure Laterality Date  . CESAREAN SECTION      Prior to Admission medications   Medication Sig Start Date End Date Taking? Authorizing Provider  azithromycin (ZITHROMAX Z-PAK) 250 MG tablet Take 2 tablets (500 mg) on  Day 1,  followed by 1 tablet (250 mg) once daily on Days 2 through 5. 08/22/18   Creg Gilmer, Charlesetta Ivory, PA-C  benzonatate (TESSALON PERLES) 100 MG capsule Take 1-2 tabs TID prn cough 08/22/18   Teriyah Purington, Charlesetta Ivory, PA-C  fluticasone (FLONASE) 50 MCG/ACT nasal spray Place 2 sprays into both nostrils daily. 08/22/18   Dayn Barich, Charlesetta Ivory, PA-C  predniSONE (DELTASONE) 10 MG tablet Take 1 tablet (10 mg total) by mouth 2 (two) times daily with a meal. 08/22/18   Sherrice Creekmore, Charlesetta Ivory, PA-C    Allergies Patient has no known allergies.  No family history on file.  Social History Social History   Tobacco Use  . Smoking status: Never Smoker  . Smokeless tobacco: Never Used  Substance Use Topics  . Alcohol use: No  . Drug use: No    Review of Systems  Constitutional: Negative for fever. Eyes: Negative for visual  changes. ENT: Negative for sore throat. Cardiovascular: Negative for chest pain. Respiratory: Negative for shortness of breath. Reports cough as above Gastrointestinal: Negative for abdominal pain, vomiting and diarrhea. Genitourinary: Negative for dysuria. Musculoskeletal: Negative for back pain. Skin: Negative for rash. Neurological: Negative for headaches, focal weakness or numbness. ____________________________________________  PHYSICAL EXAM:  VITAL SIGNS: ED Triage Vitals  Enc Vitals Group     BP 08/22/18 1410 114/77     Pulse Rate 08/22/18 1410 91     Resp 08/22/18 1410 20     Temp 08/22/18 1410 98.3 F (36.8 C)     Temp Source 08/22/18 1410 Oral     SpO2 08/22/18 1410 98 %     Weight 08/22/18 1411 198 lb (89.8 kg)     Height 08/22/18 1411 5\' 4"  (1.626 m)     Head Circumference --      Peak Flow --      Pain Score 08/22/18 1411 10     Pain Loc --      Pain Edu? --      Excl. in GC? --     Constitutional: Alert and oriented. Well appearing and in no distress. Head: Normocephalic and atraumatic. Eyes: Conjunctivae are normal. PERRL. Normal extraocular movements Ears: Canals clear. TMs intact bilaterally. Nose: No congestion/rhinorrhea/epistaxis. Mouth/Throat: Mucous membranes are moist.  Uvula is midline and tonsils are flat.  No oropharyngeal lesions appreciated. Neck: Supple. No thyromegaly. Hematological/Lymphatic/Immunological: No cervical lymphadenopathy. Cardiovascular:  Normal rate, regular rhythm. Normal distal pulses. Respiratory: Normal respiratory effort. No wheezes/rales/rhonchi. Gastrointestinal: Soft and nontender. No distention. Musculoskeletal: Nontender with normal range of motion in all extremities.  Neurologic:  Normal gait without ataxia. Normal speech and language. No gross focal neurologic deficits are appreciated. Skin:  Skin is warm, dry and intact. No rash noted. ___________________________________________    RADIOLOGY  CXR  IMPRESSION: Normal chest x-ray. ____________________________________________  PROCEDURES  Procedures Decadron 10 mg IM ____________________________________________  INITIAL IMPRESSION / ASSESSMENT AND PLAN / ED COURSE  Patient with ED evaluation of a 2-week complaint of nonproductive cough.  Patient without any attempt to alleviate her symptoms in the interim.  Her exam is reassuring at this time.  Her chest x-ray is also negative for any acute infectious process.  Patient will be treated empirically given duration of her symptoms with a azithromycin, Tessalon Perles, Flonase, and prednisone dose.  She will follow with her primary provider or return to the ED for acutely worsening symptoms. ____________________________________________  FINAL CLINICAL IMPRESSION(S) / ED DIAGNOSES  Final diagnoses:  Viral URI with cough      Ashleigh Arya, Charlesetta IvoryJenise V Bacon, PA-C 08/22/18 2221    Sharman CheekStafford, Phillip, MD 08/25/18 1902

## 2018-08-22 NOTE — ED Triage Notes (Signed)
Cough x 2 weeks

## 2019-04-09 ENCOUNTER — Encounter: Payer: Self-pay | Admitting: Emergency Medicine

## 2019-04-09 ENCOUNTER — Emergency Department
Admission: EM | Admit: 2019-04-09 | Discharge: 2019-04-09 | Disposition: A | Discharge status: Home / Self Care | Payer: Medicare Other | Attending: Emergency Medicine | Admitting: Emergency Medicine

## 2019-04-09 ENCOUNTER — Other Ambulatory Visit: Payer: Self-pay

## 2019-04-09 DIAGNOSIS — L03211 Cellulitis of face: Secondary | ICD-10-CM | POA: Diagnosis not present

## 2019-04-09 DIAGNOSIS — Z79899 Other long term (current) drug therapy: Secondary | ICD-10-CM | POA: Diagnosis not present

## 2019-04-09 DIAGNOSIS — F70 Mild intellectual disabilities: Secondary | ICD-10-CM | POA: Diagnosis not present

## 2019-04-09 DIAGNOSIS — R220 Localized swelling, mass and lump, head: Secondary | ICD-10-CM | POA: Diagnosis present

## 2019-04-09 LAB — BASIC METABOLIC PANEL
Anion gap: 7 (ref 5–15)
BUN: 17 mg/dL (ref 6–20)
CO2: 22 mmol/L (ref 22–32)
Calcium: 9 mg/dL (ref 8.9–10.3)
Chloride: 110 mmol/L (ref 98–111)
Creatinine, Ser: 0.84 mg/dL (ref 0.44–1.00)
GFR calc Af Amer: >60 mL/min (ref >60)
GFR calc non Af Amer: >60 mL/min (ref >60)
Glucose, Bld: 93 mg/dL (ref 70–99)
Potassium: 4.6 mmol/L (ref 3.5–5.1)
Sodium: 139 mmol/L (ref 135–145)

## 2019-04-09 LAB — CBC WITH DIFFERENTIAL/PLATELET
Abs Immature Granulocytes: 0.02 10*3/uL (ref 0.00–0.07)
Basophils Absolute: 0.1 10*3/uL (ref 0.0–0.1)
Basophils Relative: 1 %
Eosinophils Absolute: 0.3 10*3/uL (ref 0.0–0.5)
Eosinophils Relative: 3 %
HCT: 36.3 % (ref 36.0–46.0)
Hemoglobin: 11.8 g/dL — ABNORMAL LOW (ref 12.0–15.0)
Immature Granulocytes: 0 %
Lymphocytes Relative: 28 %
Lymphs Abs: 2.7 10*3/uL (ref 0.7–4.0)
MCH: 26.2 pg (ref 26.0–34.0)
MCHC: 32.5 g/dL (ref 30.0–36.0)
MCV: 80.7 fL (ref 80.0–100.0)
Monocytes Absolute: 0.7 10*3/uL (ref 0.1–1.0)
Monocytes Relative: 7 %
Neutro Abs: 5.9 10*3/uL (ref 1.7–7.7)
Neutrophils Relative %: 61 %
Platelets: 345 10*3/uL (ref 150–400)
RBC: 4.5 MIL/uL (ref 3.87–5.11)
RDW: 13.4 % (ref 11.5–15.5)
WBC: 9.6 10*3/uL (ref 4.0–10.5)
nRBC: 0 % (ref 0.0–0.2)

## 2019-04-09 MED ORDER — CLINDAMYCIN HCL 300 MG PO CAPS: 300.0000 mg | ORAL_CAPSULE | Freq: Three times a day (TID) | ORAL | 0 refills | Status: AC

## 2019-04-09 MED ORDER — HYDROXYZINE HCL 50 MG PO TABS: 50.0000 mg | ORAL_TABLET | Freq: Three times a day (TID) | ORAL | 0 refills | Status: DC | PRN

## 2019-04-09 MED ORDER — METHYLPREDNISOLONE 4 MG PO TBPK: ORAL_TABLET | ORAL | 0 refills | Status: DC

## 2019-04-09 MED ORDER — CLINDAMYCIN PHOSPHATE 600 MG/50ML IV SOLN
600.0000 mg | Freq: Once | INTRAVENOUS | Status: AC
  Administered 2019-04-09: 600 mg via INTRAVENOUS
  Filled 2019-04-09: qty 50

## 2019-04-09 NOTE — ED Notes (Signed)
See traige note. PT has facial swelling to right side of face from possible infected nose ring. Pt also wants to know if lip can be removed here, skin has grown over lip ring.

## 2019-04-09 NOTE — ED Triage Notes (Signed)
Pt presents to ED via POV with c/o facial swelling and skin reaction. Pt states recently changed out her piercings on R side of nose and her R side of her upper lip. Pt states has never had previous reaction. No airway involvement at this time. Able to speak in complete sentences without difficulty. Pt states skin reaction has been going on x 2 weeks.

## 2019-04-09 NOTE — ED Provider Notes (Addendum)
[facility] Emergency Department Provider Note   ____________________________________________   First MD Initiated Contact with Patient 04/09/19 1233     (approximate)  I have reviewed the triage vital signs and the nursing notes.   HISTORY  Chief Complaint Allergic Reaction    HPI Caroline MoreSherica S Belasco is a 28 y.o. female patient presents with facial edema and erythema.  Patient also have multiple pustular lesions on the right nostril.  Patient state onset of complaint started 2 weeks ago when she inserted a new nose ring.  Patient believes she is having intermittent fever and chills since onset.  Patient denies dyspnea or any other anaphylactic signs and symptoms.  Patient states facial pain which she rated as a 9/10.  Patient describes her pain as "achy".  No palliative measures except for removing the nasal ring.         Past Medical History:  Diagnosis Date  . Herpes labialis   . Mild mental retardation     Patient Active Problem List   Diagnosis Date Noted  . Sepsis (HCC) 11/27/2015  . Pyelonephritis 11/27/2015  . AKI (acute kidney injury) (HCC) 11/27/2015    Past Surgical History:  Procedure Laterality Date  . CESAREAN SECTION      Prior to Admission medications   Medication Sig Start Date End Date Taking? Authorizing Provider  clindamycin (CLEOCIN) 300 MG capsule Take 1 capsule (300 mg total) by mouth 3 (three) times daily for 10 days. 04/09/19 04/19/19  Joni ReiningSmith, Giulianna Rocha K, PA-C  fluticasone (FLONASE) 50 MCG/ACT nasal spray Place 2 sprays into both nostrils daily. 08/22/18   Menshew, Charlesetta IvoryJenise V Bacon, PA-C  hydrOXYzine (ATARAX/VISTARIL) 50 MG tablet Take 1 tablet (50 mg total) by mouth 3 (three) times daily as needed for itching. 04/09/19   Joni ReiningSmith, Kallie Depolo K, PA-C  methylPREDNISolone (MEDROL DOSEPAK) 4 MG TBPK tablet Take Tapered dose as directed 04/09/19   Joni ReiningSmith, Reynard Christoffersen K, PA-C    Allergies Patient has no known allergies.  History reviewed. No  pertinent family history.  Social History Social History   Tobacco Use  . Smoking status: Never Smoker  . Smokeless tobacco: Never Used  Substance Use Topics  . Alcohol use: No  . Drug use: No    Review of Systems Constitutional: No fever/chills Eyes: No visual changes. ENT: No sore throat. Cardiovascular: Denies chest pain. Respiratory: Denies shortness of breath. Gastrointestinal: No abdominal pain.  No nausea, no vomiting.  No diarrhea.  No constipation. Genitourinary: Negative for dysuria. Musculoskeletal: Negative for back pain. Skin: Facial edema and rash. Neurological: Negative for headaches, focal weakness or numbness.   ____________________________________________   PHYSICAL EXAM:  VITAL SIGNS: ED Triage Vitals  Enc Vitals Group     BP 04/09/19 1207 126/85     Pulse Rate 04/09/19 1207 78     Resp 04/09/19 1207 16     Temp 04/09/19 1207 99 F (37.2 C)     Temp Source 04/09/19 1207 Oral     SpO2 04/09/19 1207 99 %     Weight 04/09/19 1207 198 lb (89.8 kg)     Height 04/09/19 1207 5\' 4"  (1.626 m)     Head Circumference --      Peak Flow --      Pain Score 04/09/19 1210 9     Pain Loc --      Pain Edu? --      Excl. in GC? --    Constitutional: Alert and oriented. Well appearing and in  no acute distress. Nose: No congestion/rhinnorhea. Mouth/Throat: Mucous membranes are moist.  Oropharynx non-erythematous. Neck: No stridor.  Hematological/Lymphatic/Immunilogical: No cervical lymphadenopathy. Cardiovascular: Normal rate, regular rhythm. Grossly normal heart sounds.  Good peripheral circulation. Respiratory: Normal respiratory effort.  No retractions. Lungs CTAB. Skin:  Skin is warm, dry and intact.  Pressure lesion right nasal area.  Facial edema and erythema. Psychiatric: Mood and affect are normal. Speech and behavior are normal.  ____________________________________________   LABS (all labs ordered are listed, but only abnormal results are  displayed)  Labs Reviewed  CBC WITH DIFFERENTIAL/PLATELET - Abnormal; Notable for the following components:      Result Value   Hemoglobin 11.8 (*)    All other components within normal limits  BASIC METABOLIC PANEL   ____________________________________________  EKG   ____________________________________________  RADIOLOGY  ED MD interpretation:    Official radiology report(s): No results found.  ____________________________________________   PROCEDURES  Procedure(s) performed (including Critical Care):  Procedures   ____________________________________________   INITIAL IMPRESSION / ASSESSMENT AND PLAN / ED COURSE  As part of my medical decision making, I reviewed the following data within the [place] was evaluated in Emergency Department on 04/09/2019 for the symptoms described in the history of present illness. She was evaluated in the context of the global COVID-19 pandemic, which necessitated consideration that the patient might be at risk for infection with the SARS-CoV-2 virus that causes COVID-19. Institutional protocols and algorithms that pertain to the evaluation of patients at risk for COVID-19 are in a state of rapid change based on information released by regulatory bodies including the CDC and federal and state organizations. These policies and algorithms were followed during the patient's care in the ED.    Patient presents with facial edema and pustular lesions secondary to a recent nasal piercing.  Discussed lab results with patient.  Discussed sequela of cellulitis with patient.  Patient given discharge care instruction advised take medication as directed.  Patient advised follow-up PCP in 1 week.  Return right ED if condition worsens.   ____________________________________________   FINAL CLINICAL IMPRESSION(S) / ED DIAGNOSES  Final diagnoses:  Facial cellulitis     ED Discharge Orders          Ordered    clindamycin (CLEOCIN) 300 MG capsule  3 times daily     04/09/19 1331    methylPREDNISolone (MEDROL DOSEPAK) 4 MG TBPK tablet     04/09/19 1331    hydrOXYzine (ATARAX/VISTARIL) 50 MG tablet  3 times daily PRN     04/09/19 1331           Note:  This document was prepared using Dragon voice recognition software and may include unintentional dictation errors.    Sable Feil, PA-C 04/09/19 1334    Vanessa King Salmon, MD 04/09/19 1346    Sable Feil, PA-C 04/09/19 1402    Vanessa , MD 04/09/19 1919

## 2019-04-09 NOTE — Discharge Instructions (Addendum)
Take medication as directed.

## 2019-06-07 ENCOUNTER — Other Ambulatory Visit: Payer: Self-pay

## 2019-06-07 ENCOUNTER — Encounter: Payer: Self-pay | Admitting: Emergency Medicine

## 2019-06-07 ENCOUNTER — Emergency Department
Admission: EM | Admit: 2019-06-07 | Discharge: 2019-06-07 | Disposition: A | Discharge status: Home / Self Care | Payer: Medicare Other | Attending: Student in an Organized Health Care Education/Training Program | Admitting: Student in an Organized Health Care Education/Training Program

## 2019-06-07 DIAGNOSIS — Y999 Unspecified external cause status: Secondary | ICD-10-CM | POA: Insufficient documentation

## 2019-06-07 DIAGNOSIS — W458XXA Other foreign body or object entering through skin, initial encounter: Secondary | ICD-10-CM | POA: Insufficient documentation

## 2019-06-07 DIAGNOSIS — R51 Headache: Secondary | ICD-10-CM | POA: Diagnosis present

## 2019-06-07 DIAGNOSIS — Y929 Unspecified place or not applicable: Secondary | ICD-10-CM | POA: Diagnosis not present

## 2019-06-07 DIAGNOSIS — Y9389 Activity, other specified: Secondary | ICD-10-CM | POA: Diagnosis not present

## 2019-06-07 DIAGNOSIS — Z79899 Other long term (current) drug therapy: Secondary | ICD-10-CM | POA: Diagnosis not present

## 2019-06-07 DIAGNOSIS — Z789 Other specified health status: Secondary | ICD-10-CM

## 2019-06-07 MED ORDER — AMOXICILLIN 500 MG PO CAPS: 500.0000 mg | ORAL_CAPSULE | Freq: Three times a day (TID) | ORAL | 0 refills | Status: DC

## 2019-06-07 MED ORDER — LIDOCAINE VISCOUS HCL 2 % MT SOLN: 15.0000 mL | Freq: Once | OROMUCOSAL | Status: DC

## 2019-06-07 MED ORDER — LIDOCAINE HCL (PF) 1 % IJ SOLN: 5.0000 mL | Freq: Once | INTRAMUSCULAR | Status: DC

## 2019-06-07 NOTE — ED Triage Notes (Signed)
Pt presents to ED via POV with c/o abscess around her [place] piercing to R upper lip. Pt states she has been unable to get the piercing out of her lip at this time.

## 2019-06-07 NOTE — ED Provider Notes (Signed)
[facility] Emergency Department Provider Note  ____________________________________________  Time seen: Approximately 1:52 PM  I have reviewed the triage vital signs and the nursing notes.   HISTORY  Chief Complaint Abscess    HPI [name] is a 28 y.o. female that presents to the emergency department for evaluation of pain surrounding lip piercing for 3 days.  Patient states that lip was pierced in about June.  The back of the piercing has grown into her lip.  She went to the tattoo parlor to have piercing removed and they referred her to the emergency department.  She is unsure of fever.     Past Medical History:  Diagnosis Date  . Herpes labialis   . Mild mental retardation     Patient Active Problem List   Diagnosis Date Noted  . Sepsis ([place]) 11/27/2015  . Pyelonephritis 11/27/2015  . AKI (acute kidney injury) ([place]) 11/27/2015    Past Surgical History:  Procedure Laterality Date  . CESAREAN SECTION      Prior to Admission medications   Medication Sig Start Date End Date Taking? Authorizing Provider  amoxicillin (AMOXIL) 500 MG capsule Take 1 capsule (500 mg total) by mouth 3 (three) times daily. 06/07/19   Laban Emperor, PA-C  fluticasone (FLONASE) 50 MCG/ACT nasal spray Place 2 sprays into both nostrils daily. 08/22/18   Menshew, Dannielle Karvonen, PA-C    Allergies Patient has no known allergies.  No family history on file.  Social History Social History   Tobacco Use  . Smoking status: Never Smoker  . Smokeless tobacco: Never Used  Substance Use Topics  . Alcohol use: No  . Drug use: No     Review of Systems  Constitutional: No chills Cardiovascular: No chest pain. Respiratory: No SOB. Gastrointestinal: No abdominal pain.  No vomiting.  Musculoskeletal: Negative for musculoskeletal pain. Skin: Negative for rash, abrasions, lacerations, ecchymosis.   ____________________________________________   PHYSICAL  EXAM:  VITAL SIGNS: ED Triage Vitals [06/07/19 1235]  Enc Vitals Group     BP 122/83     Pulse Rate 80     Resp 20     Temp 99.2 F (37.3 C)     Temp Source Oral     SpO2 98 %     Weight 198 lb (89.8 kg)     Height 5\' 4"  (1.626 m)     Head Circumference      Peak Flow      Pain Score 9     Pain Loc      Pain Edu?      Excl. in [place]?      Constitutional: Alert and oriented. Well appearing and in no acute distress. Eyes: Conjunctivae are normal. PERRL. EOMI. Head: Atraumatic. ENT:      Ears:      Nose: No congestion/rhinnorhea.      Mouth/Throat: Mucous membranes are moist.  Right upper lip piercing with back that has grown into lip.  Tender to palpation.  No visualized drainage.  Mild swelling.  No palpable abscess. Neck: No stridor.  Cardiovascular: Normal rate, regular rhythm.  Good peripheral circulation. Respiratory: Normal respiratory effort without tachypnea or retractions. Lungs CTAB. Good air entry to the bases with no decreased or absent breath sounds. Musculoskeletal: Full range of motion to all extremities. No gross deformities appreciated. Neurologic:  Normal speech and language. No gross focal neurologic deficits are appreciated.  Skin:  Skin is warm, dry and intact. No rash noted. Psychiatric: Mood  and affect are normal. Speech and behavior are normal. Patient exhibits appropriate insight and judgement.   ____________________________________________   LABS (all labs ordered are listed, but only abnormal results are displayed)  Labs Reviewed - No data to display ____________________________________________  EKG   ____________________________________________  RADIOLOGY   No results found.  ____________________________________________    PROCEDURES  Procedure(s) performed:    Procedures    Medications - No data to display   ____________________________________________   INITIAL IMPRESSION / ASSESSMENT AND PLAN / ED  COURSE  Pertinent labs & imaging results that were available during my care of the patient were reviewed by me and considered in my medical decision making (see chart for details).  Review of the [place] CSRS was performed in accordance of the [facility] prior to dispensing any controlled drugs.     Patient presented to the emergency department for painful lip piercing.  Vital signs and exam are reassuring.  Piercing became painful 3 days ago so she will be covered for any bacterial infection.  No palpable abscess.  Patient will be started on amoxicillin for infection.  Patient will follow-up with dermatology after antibiotics for removal.  Patient will be discharged home with prescriptions for amoxicillin. Patient is to follow up with dermatology as directed. Patient is given ED precautions to return to the ED for any worsening or new symptoms.  [name] was evaluated in Emergency Department on 06/07/2019 for the symptoms described in the history of present illness. She was evaluated in the context of the global COVID-19 pandemic, which necessitated consideration that the patient might be at risk for infection with the SARS-CoV-2 virus that causes COVID-19. Institutional protocols and algorithms that pertain to the evaluation of patients at risk for COVID-19 are in a state of rapid change based on information released by regulatory bodies including the CDC and federal and state organizations. These policies and algorithms were followed during the patient's care in the ED.   ____________________________________________  FINAL CLINICAL IMPRESSION(S) / ED DIAGNOSES  Final diagnoses:  Body piercing      NEW MEDICATIONS STARTED DURING THIS VISIT:  ED Discharge Orders         Ordered    amoxicillin (AMOXIL) 500 MG capsule  3 times daily     06/07/19 1405              This chart was dictated using voice recognition software/Dragon. Despite best efforts to proofread, errors can occur which  can change the meaning. Any change was purely unintentional.    Enid DerryWagner, Gweneth Fredlund, PA-C 06/07/19 1648    Willy Eddyobinson, Patrick, MD 06/08/19 [phone]

## 2019-06-07 NOTE — ED Notes (Signed)
See triage note  States she had a piercing placed in above upper lip / nose   Swelling noted

## 2019-06-07 NOTE — Discharge Instructions (Signed)
Please take antibiotic for any infection.  Return to the emergency department for worsening symptoms.  Please follow-up with dermatology for piercing removal.

## 2019-06-17 ENCOUNTER — Telehealth: Payer: Self-pay | Admitting: Emergency Medicine

## 2019-06-17 NOTE — Telephone Encounter (Signed)
Called patient back after she left message.  Says her lip continues to be swollen and the dermatologist is not taking patients until next year.  I told her to call arount and if none in [place] can see her to call dermatologists in [place].  I told her that if the lip is getting worse or she feels the infection is spreading that she needs to return here for recheck.

## 2019-06-30 ENCOUNTER — Other Ambulatory Visit: Payer: Self-pay

## 2019-06-30 ENCOUNTER — Emergency Department
Admission: EM | Admit: 2019-06-30 | Discharge: 2019-06-30 | Disposition: A | Discharge status: Home / Self Care | Payer: Medicare Other | Attending: Emergency Medicine | Admitting: Emergency Medicine

## 2019-06-30 DIAGNOSIS — Z79899 Other long term (current) drug therapy: Secondary | ICD-10-CM | POA: Insufficient documentation

## 2019-06-30 DIAGNOSIS — X58XXXA Exposure to other specified factors, initial encounter: Secondary | ICD-10-CM | POA: Diagnosis not present

## 2019-06-30 DIAGNOSIS — S00551A Superficial foreign body of lip, initial encounter: Secondary | ICD-10-CM | POA: Insufficient documentation

## 2019-06-30 DIAGNOSIS — M795 Residual foreign body in soft tissue: Secondary | ICD-10-CM

## 2019-06-30 DIAGNOSIS — F70 Mild intellectual disabilities: Secondary | ICD-10-CM | POA: Diagnosis not present

## 2019-06-30 DIAGNOSIS — Z1889 Other specified retained foreign body fragments: Secondary | ICD-10-CM | POA: Diagnosis not present

## 2019-06-30 DIAGNOSIS — Y9389 Activity, other specified: Secondary | ICD-10-CM | POA: Diagnosis not present

## 2019-06-30 DIAGNOSIS — S0993XA Unspecified injury of face, initial encounter: Secondary | ICD-10-CM | POA: Diagnosis present

## 2019-06-30 DIAGNOSIS — Y999 Unspecified external cause status: Secondary | ICD-10-CM | POA: Diagnosis not present

## 2019-06-30 DIAGNOSIS — Y929 Unspecified place or not applicable: Secondary | ICD-10-CM | POA: Insufficient documentation

## 2019-06-30 MED ORDER — LIDOCAINE-EPINEPHRINE (PF) 2 %-1:200000 IJ SOLN
10.0000 mL | Freq: Once | INTRAMUSCULAR | Status: AC
  Administered 2019-06-30: 10 mL
  Filled 2019-06-30: qty 10

## 2019-06-30 NOTE — ED Triage Notes (Signed)
Pt has an engrown piercing of the right upper lip and was seen here and referred to ENT but can not get an appt til feb.

## 2019-06-30 NOTE — Discharge Instructions (Addendum)
Follow-up with your primary care provider if any continued problems.  Watch for any signs of infection.  As we discussed rinse your mouth out with warm salt water after eating and before bedtime.  The area should heal without any difficulty.  You might see some small amount of blood if you spit today.  Avoid salty foods and foods with lots of acid like orange juice as it will burn the area.  Soft foods for tonight to eat.

## 2019-06-30 NOTE — ED Notes (Signed)
This RN attempted to call legal guardian with no response. Rang continually without a voicemail.

## 2019-06-30 NOTE — ED Provider Notes (Signed)
[facility] Emergency Department Provider Note  ____________________________________________   First MD Initiated Contact with Patient 06/30/19 1457     (approximate)  I have reviewed the triage vital signs and the nursing notes.   HISTORY  Chief Complaint Foreign Body in Skin   HPI [name] is a 28 y.o. female presents to the ED with a foreign body embedded to the right upper lip.  Patient had her lip pierced at the first of the year.  She was here 1 month ago and placed on antibiotics.  She was referred to an ENT for removal.  She has called dermatology who are not taking any new patients until next year.  She denies any recent infection.  She states she just wants it removed as it is embedded in her upper lip.  She rates her pain as a 10/10.     Past Medical History:  Diagnosis Date  . Herpes labialis   . Mild mental retardation     Patient Active Problem List   Diagnosis Date Noted  . Sepsis (HCC) 11/27/2015  . Pyelonephritis 11/27/2015  . AKI (acute kidney injury) (HCC) 11/27/2015    Past Surgical History:  Procedure Laterality Date  . CESAREAN SECTION      Prior to Admission medications   Medication Sig Start Date End Date Taking? Authorizing Provider  amoxicillin (AMOXIL) 500 MG capsule Take 1 capsule (500 mg total) by mouth 3 (three) times daily. 06/07/19   Enid Derry, PA-C  fluticasone (FLONASE) 50 MCG/ACT nasal spray Place 2 sprays into both nostrils daily. 08/22/18   Menshew, Charlesetta Ivory, PA-C    Allergies Patient has no known allergies.  No family history on file.  Social History Social History   Tobacco Use  . Smoking status: Never Smoker  . Smokeless tobacco: Never Used  Substance Use Topics  . Alcohol use: No  . Drug use: No    Review of Systems Constitutional: No fever/chills Eyes: No visual changes. ENT: Foreign body right upper lip. Cardiovascular: Denies chest pain. Skin: Foreign body as noted  above. Neurological: Negative for headaches, focal weakness or numbness. ____________________________________________   PHYSICAL EXAM:  VITAL SIGNS: ED Triage Vitals  Enc Vitals Group     BP 06/30/19 1450 118/73     Pulse Rate 06/30/19 1450 90     Resp 06/30/19 1450 18     Temp 06/30/19 1450 98.7 F (37.1 C)     Temp Source 06/30/19 1450 Oral     SpO2 06/30/19 1450 99 %     Weight 06/30/19 1451 184 lb (83.5 kg)     Height 06/30/19 1451 5\' 4"  (1.626 m)     Head Circumference --      Peak Flow --      Pain Score 06/30/19 1451 10     Pain Loc --      Pain Edu? --      Excl. in GC? --    Constitutional: Alert and oriented. Well appearing and in no acute distress. Eyes: Conjunctivae are normal. PERRL. EOMI. Head: Atraumatic. Nose: No congestion/rhinnorhea. Mouth/Throat: Right upper lip has a metal post exteriorly.  The base of the piercing is not visible from the inside of the lip.  There is a small pimple-like lesion without erythema.  Nontender to palpation. Neck: No stridor.   Cardiovascular: Normal rate, regular rhythm. Grossly normal heart sounds.  Good peripheral circulation. Respiratory: Normal respiratory effort.  No retractions. Lungs CTAB. Gastrointestinal: Soft and  nontender. No distention. No abdominal bruits. No CVA tenderness. Neurologic:  Normal speech and language. No gross focal neurologic deficits are appreciated. No gait instability. Skin:  Skin is warm, dry and intact.  Psychiatric: Mood and affect are normal. Speech and behavior are normal.  ____________________________________________   LABS (all labs ordered are listed, but only abnormal results are displayed)  Labs Reviewed - No data to display   PROCEDURES  Procedure(s) performed (including Critical Care):  .Foreign Body Removal  Date/Time: 06/30/2019 3:45 PM Performed by: Johnn Hai, PA-C Authorized by: Johnn Hai, PA-C  Consent: Verbal consent obtained. Risks and benefits:  risks, benefits and alternatives were discussed Consent given by: patient Patient understanding: patient states understanding of the procedure being performed Patient consent: the patient's understanding of the procedure matches consent given Patient identity confirmed: verbally with patient Body area: skin General location: head/neck Location details: mouth Anesthesia: local infiltration  Anesthesia: Local Anesthetic: lidocaine 2% with epinephrine Anesthetic total: 0.5 mL  Sedation: Patient sedated: no  Patient restrained: no Removal mechanism: hemostat, scalpel and forceps Tendon involvement: none Complexity: simple 1 objects recovered. Objects recovered: post Post-procedure assessment: foreign body removed Patient tolerance: patient tolerated the procedure well with no immediate complications   ____________________________________________   INITIAL IMPRESSION / ASSESSMENT AND PLAN / ED COURSE  As part of my medical decision making, I reviewed the following data within the electronic MEDICAL RECORD NUMBER Notes from prior ED visits and [place] Controlled Substance Database   28 year old female presents to the ED with a piercing in her right upper lip that is been embedded for approximately 3 weeks.  Patient was seen last month at which time she was placed on antibiotic.  She denies any symptoms of drainage, pain, fever.  She has tried dermatology offices to have this removed without any success as they are not taking new patients until February.  Procedure was explained to the patient and she agrees.  Foreign body was removed with out complications.  Patient will follow-up with her PCP if any continued problems or return to the emergency department if any severe worsening of her symptoms.  ____________________________________________   FINAL CLINICAL IMPRESSION(S) / ED DIAGNOSES  Final diagnoses:  Foreign body (FB) in soft tissue     ED Discharge Orders    None       Note:   This document was prepared using Dragon voice recognition software and may include unintentional dictation errors.    Johnn Hai, PA-C 06/30/19 1550    Blake Divine, MD 07/01/19 [phone]

## 2019-06-30 NOTE — ED Notes (Signed)
Pt seen in September for same. Pt with ingrown piercing. Attempted to follow up with ENT without success, states they are not seeing new patients until Feb. Pt with continued piercing in her lip, minimal swelling noted to upper lip at this time.

## 2019-12-19 ENCOUNTER — Other Ambulatory Visit: Payer: Self-pay

## 2019-12-19 ENCOUNTER — Emergency Department
Admission: EM | Admit: 2019-12-19 | Discharge: 2019-12-19 | Disposition: A | Discharge status: Home / Self Care | Payer: Medicare Other | Attending: Emergency Medicine | Admitting: Emergency Medicine

## 2019-12-19 ENCOUNTER — Encounter: Payer: Self-pay | Admitting: Emergency Medicine

## 2019-12-19 ENCOUNTER — Emergency Department: Payer: Medicare Other

## 2019-12-19 DIAGNOSIS — R2242 Localized swelling, mass and lump, left lower limb: Secondary | ICD-10-CM | POA: Diagnosis not present

## 2019-12-19 DIAGNOSIS — B349 Viral infection, unspecified: Secondary | ICD-10-CM | POA: Diagnosis not present

## 2019-12-19 DIAGNOSIS — R519 Headache, unspecified: Secondary | ICD-10-CM | POA: Insufficient documentation

## 2019-12-19 DIAGNOSIS — R070 Pain in throat: Secondary | ICD-10-CM | POA: Insufficient documentation

## 2019-12-19 DIAGNOSIS — R42 Dizziness and giddiness: Secondary | ICD-10-CM | POA: Diagnosis not present

## 2019-12-19 DIAGNOSIS — M79672 Pain in left foot: Secondary | ICD-10-CM | POA: Diagnosis present

## 2019-12-19 LAB — URINALYSIS, COMPLETE (UACMP) WITH MICROSCOPIC
Bilirubin Urine: NEGATIVE
Glucose, UA: NEGATIVE mg/dL
Hgb urine dipstick: NEGATIVE
Ketones, ur: NEGATIVE mg/dL
Leukocytes,Ua: NEGATIVE
Nitrite: NEGATIVE
Protein, ur: NEGATIVE mg/dL
Specific Gravity, Urine: 1.012 (ref 1.005–1.030)
pH: 5 (ref 5.0–8.0)

## 2019-12-19 LAB — BASIC METABOLIC PANEL
Anion gap: 8 (ref 5–15)
BUN: 12 mg/dL (ref 6–20)
CO2: 25 mmol/L (ref 22–32)
Calcium: 9.3 mg/dL (ref 8.9–10.3)
Chloride: 107 mmol/L (ref 98–111)
Creatinine, Ser: 0.8 mg/dL (ref 0.44–1.00)
GFR calc Af Amer: >60 mL/min (ref >60)
GFR calc non Af Amer: >60 mL/min (ref >60)
Glucose, Bld: 81 mg/dL (ref 70–99)
Potassium: 4 mmol/L (ref 3.5–5.1)
Sodium: 140 mmol/L (ref 135–145)

## 2019-12-19 LAB — CBC WITH DIFFERENTIAL/PLATELET
Abs Immature Granulocytes: 0.05 10*3/uL (ref 0.00–0.07)
Basophils Absolute: 0.1 10*3/uL (ref 0.0–0.1)
Basophils Relative: 1 %
Eosinophils Absolute: 0.4 10*3/uL (ref 0.0–0.5)
Eosinophils Relative: 4 %
HCT: 34.1 % — ABNORMAL LOW (ref 36.0–46.0)
Hemoglobin: 10.9 g/dL — ABNORMAL LOW (ref 12.0–15.0)
Immature Granulocytes: 0 %
Lymphocytes Relative: 21 %
Lymphs Abs: 2.4 10*3/uL (ref 0.7–4.0)
MCH: 26.1 pg (ref 26.0–34.0)
MCHC: 32 g/dL (ref 30.0–36.0)
MCV: 81.6 fL (ref 80.0–100.0)
Monocytes Absolute: 0.8 10*3/uL (ref 0.1–1.0)
Monocytes Relative: 7 %
Neutro Abs: 7.8 10*3/uL — ABNORMAL HIGH (ref 1.7–7.7)
Neutrophils Relative %: 67 %
Platelets: 343 10*3/uL (ref 150–400)
RBC: 4.18 MIL/uL (ref 3.87–5.11)
RDW: 14.5 % (ref 11.5–15.5)
WBC: 11.5 10*3/uL — ABNORMAL HIGH (ref 4.0–10.5)
nRBC: 0 % (ref 0.0–0.2)

## 2019-12-19 LAB — GROUP A STREP BY PCR: Group A Strep by PCR: NOT DETECTED

## 2019-12-19 LAB — POCT PREGNANCY, URINE: Preg Test, Ur: NEGATIVE

## 2019-12-19 NOTE — ED Provider Notes (Signed)
[facility] Emergency Department Provider Note  ____________________________________________   First MD Initiated Contact with Patient 12/19/19 1645     (approximate)  I have reviewed the triage vital signs and the nursing notes.   HISTORY  Chief Complaint Sore Throat    HPI [name] is a 29 y.o. female presents emergency department multiple complaints.  She is complaining of left foot pain and swelling, sore throat, dizziness, headache.  When asked if she was taking pain medication she said her regular doctor had decreased her to just 1 pill a day.  She had been taking 800 mg.  She denies fever chills.  No Covid exposure.  No known injury to the left foot.    Past Medical History:  Diagnosis Date  . Herpes labialis   . Mild mental retardation     Patient Active Problem List   Diagnosis Date Noted  . Sepsis (HCC) 11/27/2015  . Pyelonephritis 11/27/2015  . AKI (acute kidney injury) (HCC) 11/27/2015    Past Surgical History:  Procedure Laterality Date  . CESAREAN SECTION      Prior to Admission medications   Medication Sig Start Date End Date Taking? Authorizing Provider  fluticasone (FLONASE) 50 MCG/ACT nasal spray Place 2 sprays into both nostrils daily. 08/22/18 12/19/19  Menshew, Charlesetta Ivory, PA-C    Allergies Patient has no known allergies.  History reviewed. No pertinent family history.  Social History Social History   Tobacco Use  . Smoking status: Never Smoker  . Smokeless tobacco: Never Used  Substance Use Topics  . Alcohol use: No  . Drug use: No    Review of Systems  Constitutional: No fever/chills Eyes: No visual changes. ENT: Positive sore throat. Respiratory: Denies cough Cardiovascular: Denies chest pain Gastrointestinal: Denies abdominal pain Genitourinary: Negative for dysuria. Musculoskeletal: Negative for back pain.  Positive left foot pain Skin: Negative for rash. Psychiatric: no mood changes,      ____________________________________________   PHYSICAL EXAM:  VITAL SIGNS: ED Triage Vitals  Enc Vitals Group     BP 12/19/19 1549 123/82     Pulse Rate 12/19/19 1549 85     Resp 12/19/19 1549 18     Temp 12/19/19 1549 98.2 F (36.8 C)     Temp Source 12/19/19 1549 Oral     SpO2 12/19/19 1549 100 %     Weight 12/19/19 1550 201 lb (91.2 kg)     Height 12/19/19 1550 5\' 4"  (1.626 m)     Head Circumference --      Peak Flow --      Pain Score 12/19/19 1549 10     Pain Loc --      Pain Edu? --      Excl. in GC? --     Constitutional: Alert and oriented. Well appearing and in no acute distress. Eyes: Conjunctivae are normal.  Head: Atraumatic. Nose: No congestion/rhinnorhea. Mouth/Throat: Mucous membranes are moist.  Throat is minimally red Neck:  supple no lymphadenopathy noted Cardiovascular: Normal rate, regular rhythm. Heart sounds are normal Respiratory: Normal respiratory effort.  No retractions, lungs c t a  Abd: soft nontender bs normal all 4 quad GU: deferred Musculoskeletal: FROM all extremities, warm and well perfused, left foot has minimal swelling noted, left calf is not tender, negative Homans' sign Neurologic:  Normal speech and language.  Skin:  Skin is warm, dry and intact. No rash noted. Psychiatric: Mood and affect are normal. Speech and behavior are normal.  ____________________________________________   LABS (all labs ordered are listed, but only abnormal results are displayed)  Labs Reviewed  URINALYSIS, COMPLETE (UACMP) WITH MICROSCOPIC - Abnormal; Notable for the following components:      Result Value   Color, Urine YELLOW (*)    APPearance HAZY (*)    Bacteria, UA RARE (*)    All other components within normal limits  CBC WITH DIFFERENTIAL/PLATELET - Abnormal; Notable for the following components:   WBC 11.5 (*)    Hemoglobin 10.9 (*)    HCT 34.1 (*)    Neutro Abs 7.8 (*)    All other components within normal limits  GROUP A STREP  BY PCR  BASIC METABOLIC PANEL  POC URINE PREG, ED  POCT PREGNANCY, URINE   ____________________________________________   ____________________________________________  RADIOLOGY  X-ray left foot is negative  ____________________________________________   PROCEDURES  Procedure(s) performed: No  Procedures    ____________________________________________   INITIAL IMPRESSION / ASSESSMENT AND PLAN / ED COURSE  Pertinent labs & imaging results that were available during my care of the patient were reviewed by me and considered in my medical decision making (see chart for details).   Patient is 29 year old female presents emergency department multiple complaints.  See HPI  Physical exam shows started to be minimally red, left foot slightly swollen and tender.  Remainder exam is unremarkable  X-ray left foot CBC, metabolic panel, strep test, UA and urine pregnant   X-ray left foot is negative, CBC, metabolic panel, strep test, UA and urine prior all negative.  I explained the findings to the patient.  I did call her mother who is her guardian to let her know that all her tests were normal.  Strict instructions to return if worsening.  She is to elevate and ice her foot.  If she begins to have some redness and swelling around this area she should return the emergency department or see her regular doctor.  They both state they understand will comply.  She was discharged stable condition.  [name] was evaluated in Emergency Department on 12/19/2019 for the symptoms described in the history of present illness. She was evaluated in the context of the global COVID-19 pandemic, which necessitated consideration that the patient might be at risk for infection with the SARS-CoV-2 virus that causes COVID-19. Institutional protocols and algorithms that pertain to the evaluation of patients at risk for COVID-19 are in a state of rapid change based on information released by regulatory  bodies including the CDC and federal and state organizations. These policies and algorithms were followed during the patient's care in the ED.   As part of my medical decision making, I reviewed the following data within the [place] notes reviewed and incorporated, Labs reviewed , Old chart reviewed, Radiograph reviewed , Notes from prior ED visits and [place] Controlled Substance Database  ____________________________________________   FINAL CLINICAL IMPRESSION(S) / ED DIAGNOSES  Final diagnoses:  Viral illness      NEW MEDICATIONS STARTED DURING THIS VISIT:  Current Discharge Medication List       Note:  This document was prepared using Dragon voice recognition software and may include unintentional dictation errors.    Versie Starks, PA-C 12/19/19 Marica Otter, MD 12/22/19 2039

## 2019-12-19 NOTE — ED Notes (Signed)
Pt with c/o of sore throat. Pt denies other symptoms to include NVD and fever. Pt also with c/ of left foot pain/swelling. Pt ambulated to ED Flex room with some difficulty.

## 2019-12-19 NOTE — ED Triage Notes (Signed)
Pt ambulatory to triage states sore throat and swollen feet.

## 2019-12-19 NOTE — Discharge Instructions (Addendum)
Follow-up with your regular doctor if not better in 3 days.  Keep the foot elevated and ice to decrease the swelling.  Drink plenty of water.  Return emergency department if worsening.

## 2020-04-27 ENCOUNTER — Emergency Department
Admission: EM | Admit: 2020-04-27 | Discharge: 2020-04-27 | Disposition: A | Discharge status: Home / Self Care | Payer: Medicare Other | Attending: Emergency Medicine | Admitting: Emergency Medicine

## 2020-04-27 ENCOUNTER — Other Ambulatory Visit: Payer: Self-pay

## 2020-04-27 ENCOUNTER — Encounter: Payer: Self-pay | Admitting: Emergency Medicine

## 2020-04-27 DIAGNOSIS — R42 Dizziness and giddiness: Secondary | ICD-10-CM | POA: Diagnosis present

## 2020-04-27 DIAGNOSIS — R5383 Other fatigue: Secondary | ICD-10-CM | POA: Insufficient documentation

## 2020-04-27 DIAGNOSIS — N390 Urinary tract infection, site not specified: Secondary | ICD-10-CM | POA: Diagnosis not present

## 2020-04-27 DIAGNOSIS — E860 Dehydration: Secondary | ICD-10-CM | POA: Diagnosis not present

## 2020-04-27 HISTORY — DX: Depression, unspecified: F32.A

## 2020-04-27 LAB — URINALYSIS, COMPLETE (UACMP) WITH MICROSCOPIC
Bacteria, UA: NONE SEEN
Bilirubin Urine: NEGATIVE
Glucose, UA: NEGATIVE mg/dL
Hgb urine dipstick: NEGATIVE
Ketones, ur: NEGATIVE mg/dL
Nitrite: NEGATIVE
Protein, ur: NEGATIVE mg/dL
Specific Gravity, Urine: 1.02 (ref 1.005–1.030)
WBC, UA: >50 WBC/hpf — ABNORMAL HIGH (ref 0–5)
pH: 5 (ref 5.0–8.0)

## 2020-04-27 LAB — CBC
HCT: 34.3 % — ABNORMAL LOW (ref 36.0–46.0)
Hemoglobin: 11.7 g/dL — ABNORMAL LOW (ref 12.0–15.0)
MCH: 26.4 pg (ref 26.0–34.0)
MCHC: 34.1 g/dL (ref 30.0–36.0)
MCV: 77.4 fL — ABNORMAL LOW (ref 80.0–100.0)
Platelets: 367 10*3/uL (ref 150–400)
RBC: 4.43 MIL/uL (ref 3.87–5.11)
RDW: 14.5 % (ref 11.5–15.5)
WBC: 10.6 10*3/uL — ABNORMAL HIGH (ref 4.0–10.5)
nRBC: 0 % (ref 0.0–0.2)

## 2020-04-27 LAB — BASIC METABOLIC PANEL
Anion gap: 9 (ref 5–15)
BUN: 14 mg/dL (ref 6–20)
CO2: 24 mmol/L (ref 22–32)
Calcium: 8.9 mg/dL (ref 8.9–10.3)
Chloride: 105 mmol/L (ref 98–111)
Creatinine, Ser: 0.83 mg/dL (ref 0.44–1.00)
GFR calc Af Amer: >60 mL/min (ref >60)
GFR calc non Af Amer: >60 mL/min (ref >60)
Glucose, Bld: 120 mg/dL — ABNORMAL HIGH (ref 70–99)
Potassium: 3.7 mmol/L (ref 3.5–5.1)
Sodium: 138 mmol/L (ref 135–145)

## 2020-04-27 LAB — POCT PREGNANCY, URINE: Preg Test, Ur: NEGATIVE

## 2020-04-27 MED ORDER — ONDANSETRON 4 MG PO TBDP
4.0000 mg | ORAL_TABLET | Freq: Three times a day (TID) | ORAL | 0 refills | Status: DC | PRN
Start: 2020-04-27 — End: 2021-01-19

## 2020-04-27 MED ORDER — SODIUM CHLORIDE 0.9 % IV SOLN
2.0000 g | Freq: Once | INTRAVENOUS | Status: AC
  Administered 2020-04-27: 2 g via INTRAVENOUS
  Filled 2020-04-27: qty 20

## 2020-04-27 MED ORDER — ONDANSETRON HCL 4 MG/2ML IJ SOLN
4.0000 mg | Freq: Once | INTRAMUSCULAR | Status: AC
  Administered 2020-04-27: 4 mg via INTRAVENOUS
  Filled 2020-04-27: qty 2

## 2020-04-27 MED ORDER — KETOROLAC TROMETHAMINE 30 MG/ML IJ SOLN
15.0000 mg | Freq: Once | INTRAMUSCULAR | Status: AC
  Administered 2020-04-27: 15 mg via INTRAVENOUS
  Filled 2020-04-27: qty 1

## 2020-04-27 MED ORDER — CEPHALEXIN 500 MG PO CAPS: 500.0000 mg | ORAL_CAPSULE | Freq: Three times a day (TID) | ORAL | 0 refills | Status: AC

## 2020-04-27 MED ORDER — SODIUM CHLORIDE 0.9 % IV BOLUS
1000.0000 mL | Freq: Once | INTRAVENOUS | Status: AC
  Administered 2020-04-27: 1000 mL via INTRAVENOUS

## 2020-04-27 MED ORDER — IBUPROFEN 600 MG PO TABS
600.0000 mg | ORAL_TABLET | Freq: Three times a day (TID) | ORAL | 0 refills | Status: DC | PRN
Start: 2020-04-27 — End: 2021-01-19

## 2020-04-27 NOTE — ED Notes (Signed)
See triage note  Presents with intermittent dizziness for the past 5 days  Low grade temp on arrival  Denies any pain

## 2020-04-27 NOTE — ED Provider Notes (Addendum)
[facility] Emergency Department Provider Note  ____________________________________________   First MD Initiated Contact with Patient 04/27/20 1425     (approximate)  I have reviewed the triage vital signs and the nursing notes.   HISTORY  Chief Complaint Dizziness    HPI [name] is a 29 y.o. female  Here with generalized weakness, lightheadedness on standing, urinary frequency. Pt reports that for the past 4-5 days, pt has had progressively worsening urinary frequency. She has had associated fatigue, intermittent leg cramping, and has felt tired. She states that over the past few days, she has begun having episodes in which she feels lightheaded and dizzy when standing or walking in the heat. She has had some n/v as well. No diarrhea. No fever, chills. No h/o recurrent UTIs or kidney stones. No overt flank pain. No other complaints. No vaginal bleeding or discharge.        Past Medical History:  Diagnosis Date  . Depression   . Herpes labialis   . Mild mental retardation     Patient Active Problem List   Diagnosis Date Noted  . Sepsis (HCC) 11/27/2015  . Pyelonephritis 11/27/2015  . AKI (acute kidney injury) (HCC) 11/27/2015    Past Surgical History:  Procedure Laterality Date  . CESAREAN SECTION      Prior to Admission medications   Medication Sig Start Date End Date Taking? Authorizing Provider  fluticasone (FLONASE) 50 MCG/ACT nasal spray Place 2 sprays into both nostrils daily. 08/22/18 12/19/19  Menshew, Charlesetta Ivory, PA-C    Allergies Patient has no known allergies.  No family history on file.  Social History Social History   Tobacco Use  . Smoking status: Never Smoker  . Smokeless tobacco: Never Used  Substance Use Topics  . Alcohol use: No  . Drug use: No    Review of Systems  Review of Systems  Constitutional: Positive for fatigue. Negative for fever.  HENT: Negative for congestion and sore throat.   Eyes:  Negative for visual disturbance.  Respiratory: Negative for cough and shortness of breath.   Cardiovascular: Negative for chest pain.  Gastrointestinal: Positive for nausea and vomiting. Negative for abdominal pain and diarrhea.  Genitourinary: Negative for flank pain.  Musculoskeletal: Negative for back pain and neck pain.  Skin: Negative for rash and wound.  Neurological: Positive for dizziness and light-headedness. Negative for weakness.  All other systems reviewed and are negative.    ____________________________________________  PHYSICAL EXAM:      VITAL SIGNS: ED Triage Vitals [04/27/20 1238]  Enc Vitals Group     BP 129/76     Pulse Rate 79     Resp 16     Temp 99.1 F (37.3 C)     Temp Source Oral     SpO2 98 %     Weight 200 lb 9.9 oz (91 kg)     Height 5\' 4"  (1.626 m)     Head Circumference      Peak Flow      Pain Score 10     Pain Loc      Pain Edu?      Excl. in GC?      Physical Exam Vitals and nursing note reviewed.  Constitutional:      General: She is not in acute distress.    Appearance: She is well-developed.  HENT:     Head: Normocephalic and atraumatic.     Mouth/Throat:     Mouth: Mucous membranes  are dry.  Eyes:     Conjunctiva/sclera: Conjunctivae normal.  Cardiovascular:     Rate and Rhythm: Normal rate and regular rhythm.     Heart sounds: Normal heart sounds. No murmur heard.  No friction rub.  Pulmonary:     Effort: Pulmonary effort is normal. No respiratory distress.     Breath sounds: Normal breath sounds. No wheezing or rales.  Abdominal:     General: There is no distension.     Palpations: Abdomen is soft.     Tenderness: There is no abdominal tenderness.  Musculoskeletal:     Cervical back: Neck supple.  Skin:    General: Skin is warm.     Capillary Refill: Capillary refill takes less than 2 seconds.  Neurological:     Mental Status: She is alert and oriented to person, place, and time.     Motor: No abnormal muscle  tone.       ____________________________________________   LABS (all labs ordered are listed, but only abnormal results are displayed)  Labs Reviewed  BASIC METABOLIC PANEL - Abnormal; Notable for the following components:      Result Value   Glucose, Bld 120 (*)    All other components within normal limits  CBC - Abnormal; Notable for the following components:   WBC 10.6 (*)    Hemoglobin 11.7 (*)    HCT 34.3 (*)    MCV 77.4 (*)    All other components within normal limits  URINALYSIS, COMPLETE (UACMP) WITH MICROSCOPIC - Abnormal; Notable for the following components:   Color, Urine YELLOW (*)    APPearance CLOUDY (*)    Leukocytes,Ua LARGE (*)    WBC, UA >50 (*)    All other components within normal limits  URINE CULTURE  POC URINE PREG, ED  POCT PREGNANCY, URINE  CBG MONITORING, ED    ____________________________________________  EKG: Normal sinus rhythm, VR 79. PR 186, QRS 86, QTc 424. No acute ST elevations or depressions. No ischemia or infarct. ________________________________________  RADIOLOGY All imaging, including plain films, CT scans, and ultrasounds, independently reviewed by me, and interpretations confirmed via formal radiology reads.  ED MD interpretation:   None  Official radiology report(s): No results found.  ____________________________________________  PROCEDURES   Procedure(s) performed (including Critical Care):  Procedures  ____________________________________________  INITIAL IMPRESSION / MDM / ASSESSMENT AND PLAN / ED COURSE  As part of my medical decision making, I reviewed the following data within the electronic MEDICAL RECORD NUMBER Nursing notes reviewed and incorporated, Old chart reviewed, Notes from prior ED visits, and [place] Controlled Substance Database       *[name] was evaluated in Emergency Department on 04/27/2020 for the symptoms described in the history of present illness. She was evaluated in the context of  the global COVID-19 pandemic, which necessitated consideration that the patient might be at risk for infection with the SARS-CoV-2 virus that causes COVID-19. Institutional protocols and algorithms that pertain to the evaluation of patients at risk for COVID-19 are in a state of rapid change based on information released by regulatory bodies including the CDC and federal and state organizations. These policies and algorithms were followed during the patient's care in the ED.  Some ED evaluations and interventions may be delayed as a result of limited staffing during the pandemic.*     Medical Decision Making:  29 yo F here with urinary frequency, mild lightheadedness w/ standing, nausea, vomiting. Labs as above. Pt has mild leukocytosis, UA  concerning for UTI. Suspect UTI w/ mild dehydration, possibly related to her polyuria. No signs of pyelo clinically and she is afebrile, non-toxic. EKG nonischemic, no signs of arrhythmia or cardiac abnormality. Renal function is normal. She has some baseline mild anemia that I do not feel is contributing. Pt given fluids, antiemetics here w/ good effect. UPT is negative. Suspect her dizziness is related to dehydration. No neuro abnormalities, no risk factors for early CVA. Will d/c on ABX, zofran, and Motrin PRN pain.   ____________________________________________  FINAL CLINICAL IMPRESSION(S) / ED DIAGNOSES  Final diagnoses:  Dehydration  Dizziness  Lower urinary tract infectious disease     MEDICATIONS GIVEN DURING THIS VISIT:  Medications  cefTRIAXone (ROCEPHIN) 2 g in sodium chloride 0.9 % 100 mL IVPB (2 g Intravenous New Bag/Given 04/27/20 1700)  sodium chloride 0.9 % bolus 1,000 mL (1,000 mLs Intravenous New Bag/Given 04/27/20 1700)  ketorolac (TORADOL) 30 MG/ML injection 15 mg (15 mg Intravenous Given 04/27/20 1654)  ondansetron (ZOFRAN) injection 4 mg (4 mg Intravenous Given 04/27/20 1653)     ED Discharge Orders    None       Note:  This  document was prepared using Dragon voice recognition software and may include unintentional dictation errors.   Shaune Pollack, MD 04/27/20 1544    Shaune Pollack, MD 04/27/20 [phone]

## 2020-04-27 NOTE — ED Triage Notes (Signed)
Patient reports dizziness and lightheadedness every time she goes to the store for the past 5 days. States yesterday at the grocery store she tried drinking some water when she became dizzy, however it immediately made her vomit. States she had the same symptoms today at [employer].

## 2020-04-29 LAB — URINE CULTURE: Culture: >=100000 — AB

## 2021-01-19 ENCOUNTER — Emergency Department
Admission: EM | Admit: 2021-01-19 | Discharge: 2021-01-19 | Disposition: A | Discharge status: Home / Self Care | Payer: Medicare Other | Attending: Emergency Medicine | Admitting: Emergency Medicine

## 2021-01-19 ENCOUNTER — Emergency Department: Payer: Medicare Other

## 2021-01-19 ENCOUNTER — Other Ambulatory Visit: Payer: Self-pay

## 2021-01-19 ENCOUNTER — Encounter: Payer: Self-pay | Admitting: Emergency Medicine

## 2021-01-19 DIAGNOSIS — Z3A01 Less than 8 weeks gestation of pregnancy: Secondary | ICD-10-CM | POA: Insufficient documentation

## 2021-01-19 DIAGNOSIS — R1031 Right lower quadrant pain: Secondary | ICD-10-CM | POA: Diagnosis not present

## 2021-01-19 DIAGNOSIS — M25512 Pain in left shoulder: Secondary | ICD-10-CM | POA: Insufficient documentation

## 2021-01-19 DIAGNOSIS — O26891 Other specified pregnancy related conditions, first trimester: Secondary | ICD-10-CM | POA: Insufficient documentation

## 2021-01-19 DIAGNOSIS — Z3491 Encounter for supervision of normal pregnancy, unspecified, first trimester: Secondary | ICD-10-CM

## 2021-01-19 DIAGNOSIS — Z3201 Encounter for pregnancy test, result positive: Secondary | ICD-10-CM

## 2021-01-19 LAB — CBC WITH DIFFERENTIAL/PLATELET
Abs Immature Granulocytes: 0.04 10*3/uL (ref 0.00–0.07)
Basophils Absolute: 0.1 10*3/uL (ref 0.0–0.1)
Basophils Relative: 1 %
Eosinophils Absolute: 0.3 10*3/uL (ref 0.0–0.5)
Eosinophils Relative: 3 %
HCT: 35.1 % — ABNORMAL LOW (ref 36.0–46.0)
Hemoglobin: 11.5 g/dL — ABNORMAL LOW (ref 12.0–15.0)
Immature Granulocytes: 0 %
Lymphocytes Relative: 28 %
Lymphs Abs: 3.1 10*3/uL (ref 0.7–4.0)
MCH: 26.3 pg (ref 26.0–34.0)
MCHC: 32.8 g/dL (ref 30.0–36.0)
MCV: 80.1 fL (ref 80.0–100.0)
Monocytes Absolute: 0.7 10*3/uL (ref 0.1–1.0)
Monocytes Relative: 6 %
Neutro Abs: 6.8 10*3/uL (ref 1.7–7.7)
Neutrophils Relative %: 62 %
Platelets: 377 10*3/uL (ref 150–400)
RBC: 4.38 MIL/uL (ref 3.87–5.11)
RDW: 13 % (ref 11.5–15.5)
WBC: 11 10*3/uL — ABNORMAL HIGH (ref 4.0–10.5)
nRBC: 0 % (ref 0.0–0.2)

## 2021-01-19 LAB — COMPREHENSIVE METABOLIC PANEL
ALT: 12 U/L (ref 0–44)
AST: 18 U/L (ref 15–41)
Albumin: 3.9 g/dL (ref 3.5–5.0)
Alkaline Phosphatase: 49 U/L (ref 38–126)
Anion gap: 9 (ref 5–15)
BUN: 12 mg/dL (ref 6–20)
CO2: 20 mmol/L — ABNORMAL LOW (ref 22–32)
Calcium: 9.1 mg/dL (ref 8.9–10.3)
Chloride: 106 mmol/L (ref 98–111)
Creatinine, Ser: 0.67 mg/dL (ref 0.44–1.00)
GFR, Estimated: >60 mL/min (ref >60)
Glucose, Bld: 83 mg/dL (ref 70–99)
Potassium: 3.7 mmol/L (ref 3.5–5.1)
Sodium: 135 mmol/L (ref 135–145)
Total Bilirubin: 0.8 mg/dL (ref 0.3–1.2)
Total Protein: 7.2 g/dL (ref 6.5–8.1)

## 2021-01-19 LAB — ABO/RH: ABO/RH(D): O POS

## 2021-01-19 LAB — HCG, QUANTITATIVE, PREGNANCY: hCG, Beta Chain, Quant, S: 39868 m[IU]/mL — ABNORMAL HIGH (ref <5)

## 2021-01-19 LAB — POC URINE PREG, ED: Preg Test, Ur: POSITIVE — AB

## 2021-01-19 NOTE — ED Provider Notes (Signed)
[facility] Emergency Department Provider Note  ____________________________________________   Event Date/Time   First MD Initiated Contact with Patient 01/19/21 1805     (approximate)  I have reviewed the triage vital signs and the nursing notes.   HISTORY  Chief Complaint Shoulder Pain  HPI [name] is a 30 y.o. female reports to the emergency department for evaluation of left shoulder pain.  She states that the pain has been bothering her for a few weeks, denies any known injury or trauma.  She states that she does work at [employer] and is frequently mopping.  She notes that she has pain and a popping sensation in the shoulder when she is moving back and forth popping motion.  She also reports occasional intermittent numbness and tingling down the arm but denies any today.  She has not tried any alleviating measures.  Pain is worse with any range of motion of the left shoulder, she denies any chest pain, shortness of breath, neck pain or abdominal pain.         Past Medical History:  Diagnosis Date  . Depression   . Herpes labialis   . Mild mental retardation     Patient Active Problem List   Diagnosis Date Noted  . Sepsis (HCC) 11/27/2015  . Pyelonephritis 11/27/2015  . AKI (acute kidney injury) (HCC) 11/27/2015    Past Surgical History:  Procedure Laterality Date  . CESAREAN SECTION      Prior to Admission medications   Medication Sig Start Date End Date Taking? Authorizing Provider  fluticasone (FLONASE) 50 MCG/ACT nasal spray Place 2 sprays into both nostrils daily. 08/22/18 12/19/19  Menshew, Charlesetta Ivory, PA-C    Allergies Patient has no known allergies.  No family history on file.  Social History Social History   Tobacco Use  . Smoking status: Never Smoker  . Smokeless tobacco: Never Used  Substance Use Topics  . Alcohol use: No  . Drug use: No    Review of Systems Constitutional: No fever/chills Eyes: No visual  changes. ENT: No sore throat. Cardiovascular: Denies chest pain. Respiratory: Denies shortness of breath. Gastrointestinal: No abdominal pain.  No nausea, no vomiting.  No diarrhea.  No constipation. Genitourinary: Negative for dysuria. Musculoskeletal: + Left shoulder pain, negative for back pain. Skin: Negative for rash. Neurological: Negative for headaches, focal weakness or numbness.   ____________________________________________   PHYSICAL EXAM:  VITAL SIGNS: ED Triage Vitals  Enc Vitals Group     BP 01/19/21 1803 110/73     Pulse Rate 01/19/21 1803 89     Resp 01/19/21 1803 18     Temp 01/19/21 1803 99.1 F (37.3 C)     Temp Source 01/19/21 1803 Oral     SpO2 01/19/21 1803 97 %     Weight 01/19/21 1800 200 lb 9.9 oz (91 kg)     Height 01/19/21 1800 5\' 4"  (1.626 m)     Head Circumference --      Peak Flow --      Pain Score 01/19/21 1800 8     Pain Loc --      Pain Edu? --      Excl. in GC? --    Constitutional: Alert and oriented. Well appearing and in no acute distress. Eyes: Conjunctivae are normal. PERRL. EOMI. Head: Atraumatic. Nose: No congestion/rhinnorhea. Mouth/Throat: Mucous membranes are moist.   Neck: No stridor.  No tenderness to palpate the midline or paraspinals of the cervical spine.  Full range of motion without reproduction of left shoulder symptoms. Cardiovascular: Normal rate, regular rhythm. Grossly normal heart sounds.  Good peripheral circulation. Respiratory: Normal respiratory effort.  No retractions. Lungs CTAB. Gastrointestinal: Soft and nontender. No distention. No abdominal bruits. No CVA tenderness.  Musculoskeletal: There is tenderness at the glenohumeral joint of the left shoulder, no tenderness to palpation of the clavicle or periscapular musculature.  Patient unable to tolerate any significant range of motion of the left shoulder due to reported pain.  Radial pulse 2+ bilaterally, elbow range of motion and wrist range of motion within  normal limits.  Capillary refill less than 3 seconds. Neurologic:  Normal speech and language. No gross focal neurologic deficits are appreciated. No gait instability. Skin:  Skin is warm, dry and intact. No rash noted. Psychiatric: Mood and affect are normal. Speech and behavior are normal.  ____________________________________________   LABS (all labs ordered are listed, but only abnormal results are displayed)  Labs Reviewed  CBC WITH DIFFERENTIAL/PLATELET - Abnormal; Notable for the following components:      Result Value   WBC 11.0 (*)    Hemoglobin 11.5 (*)    HCT 35.1 (*)    All other components within normal limits  COMPREHENSIVE METABOLIC PANEL - Abnormal; Notable for the following components:   CO2 20 (*)    All other components within normal limits  POC URINE PREG, ED - Abnormal; Notable for the following components:   Preg Test, Ur POSITIVE (*)    All other components within normal limits  HCG, QUANTITATIVE, PREGNANCY  ABO/RH   ____________________________________________  RADIOLOGY Susa Raring, personally viewed and evaluated these images (plain radiographs) as part of my medical decision making, as well as reviewing the written report by the radiologist.  ED provider interpretation: Left shoulder x-rays are without any acute fracture or other obvious acute pathology  Official radiology report(s): DG Shoulder Left  Result Date: 01/19/2021 CLINICAL DATA:  Left shoulder pain for the past 2-3 weeks. EXAM: LEFT SHOULDER - 2+ VIEW COMPARISON:  None. FINDINGS: There is no evidence of fracture or dislocation. There is no evidence of arthropathy or other focal bone abnormality. Soft tissues are unremarkable. IMPRESSION: Negative. Electronically Signed   By: Obie Dredge M.D.   On: 01/19/2021 18:28   US OB LESS THAN 14 WEEKS WITH OB TRANSVAGINAL  Result Date: 01/19/2021 CLINICAL DATA:  Right lower quadrant pain. EXAM: OBSTETRIC <14 WK [place] AND TRANSVAGINAL OB US  TECHNIQUE: Both transabdominal and transvaginal ultrasound examinations were performed for complete evaluation of the gestation as well as the maternal uterus, adnexal regions, and pelvic cul-de-sac. Transvaginal technique was performed to assess early pregnancy. COMPARISON:  None. FINDINGS: Intrauterine gestational sac: Single Yolk sac:  Not Visualized. Embryo:  Not Visualized. Cardiac Activity: Not Visualized. Heart Rate: N/A  bpm MSD: 14.5 mm   6 w   2 d Subchorionic hemorrhage:  Small Maternal uterus/adnexae: A corpus luteum cyst is seen within an otherwise normal appearing right ovary. The left ovary is visualized and is normal in appearance. A small amount of pelvic free fluid is seen. IMPRESSION: Single intrauterine gestational sac and yolk sac, at 6 weeks and 2 days gestation by ultrasound evaluation, without visualization of a fetal pole. While this may be secondary to early intrauterine pregnancy, correlation with follow-up pelvic ultrasound is recommended. Electronically Signed   By: Aram Candela M.D.   On: 01/19/2021 20:07     ____________________________________________   INITIAL IMPRESSION / ASSESSMENT AND PLAN /  ED COURSE  As part of my medical decision making, I reviewed the following data within the electronic MEDICAL RECORD NUMBER Nursing notes reviewed and incorporated, Labs reviewed, Radiograph reviewed, Evaluated by EM attending Dr. Fuller Plan and Notes from prior ED visits        Patient is a 30 year old female who presents to the emergency department for evaluation of left shoulder pain.  See HPI for further details.  In triage, patient has normal vital signs.  On physical exam she has significant limitation to the range of motion of the shoulder but is neurovascularly intact.  X-rays demonstrate no acute osseous pathology.  Discussed plan for anti-inflammatories with the patient.  She would like to proceed with Toradol, and given that she does not know when her last menstrual period  was, she took a POC pregnancy test before administration of Toradol.  Her POC pregnancy test is positive.  She denies knowing when her last menstrual period was.  States that she is sexually active with her boyfriend, with whom she lives with him and his parents.  She has a documented legal guardian which was her mother, until she died in the last 2 months.  She denies any abdominal pain, vaginal bleeding or discharge or complaints related to the pregnancy.  Given that the patient has not had any initial evaluation and has uncertain ability for follow-up, initiated course with labs as well as ultrasound to rule out ectopic pregnancy.  Ultrasound does reveal a 6-week sized fetus without obvious cardiac activity today and recommended close OB/GYN follow-up and possible repeat ultrasound.  Quantitative hCG appears consistent with this and dating.  Will recommend close follow-up with OB/GYN regarding the patient's pregnancy.  In the interim, recommended she refrain from all NSAID use and use Tylenol only for her left shoulder pain in the interim.  Patient voices understanding of this plan, and is stable at this time for discharge.      ____________________________________________   FINAL CLINICAL IMPRESSION(S) / ED DIAGNOSES  Final diagnoses:  Right lower quadrant pain  Positive pregnancy test  Currently pregnant in first trimester with unknown gestational age     ED Discharge Orders    None      *Please note:  [name] was evaluated in Emergency Department on 01/19/2021 for the symptoms described in the history of present illness. She was evaluated in the context of the global COVID-19 pandemic, which necessitated consideration that the patient might be at risk for infection with the SARS-CoV-2 virus that causes COVID-19. Institutional protocols and algorithms that pertain to the evaluation of patients at risk for COVID-19 are in a state of rapid change based on information released by  regulatory bodies including the CDC and federal and state organizations. These policies and algorithms were followed during the patient's care in the ED.  Some ED evaluations and interventions may be delayed as a result of limited staffing during and the pandemic.*   Note:  This document was prepared using Dragon voice recognition software and may include unintentional dictation errors.   Lucy Chris, PA 01/19/21 2338    Concha Se, MD 01/20/21 [id number]

## 2021-01-19 NOTE — ED Triage Notes (Signed)
C/O left shoulder pain x 2-3 weeks.  Is concerned that rotator cuff is injured.  States has been working for [employer] where she has to move many heavy boxes.  Has been out of work for pain, returned to work yesterday.  AAOx3.  Skin warm and dry. NAD

## 2021-01-19 NOTE — ED Notes (Signed)
Bruce Hanks, DSS, followed up with this RN after speaking with his supervisor.  Updated information with Bruce about patient's new address and who she is living with. [address], [address] [place] [place] [address], staying with Kara Pacer [phone].  Bruce states patient is safe to be discharged to [place] and family at this time.

## 2021-01-19 NOTE — ED Notes (Signed)
Ultrasound called and notified patient is ready for imaging.

## 2021-01-19 NOTE — ED Notes (Signed)
Pt is not sure of last period  POC pregnancy is positive

## 2021-01-19 NOTE — ED Notes (Signed)
Pt to ultrasound at this time.

## 2021-01-19 NOTE — ED Notes (Signed)
See triage note   Presents with pain to left shoulder  States noticed pain about 2-3 weeks ago   Denies any injury but does move boxes

## 2021-01-19 NOTE — ED Notes (Signed)
Pt states mom was legal guardian who passed away a couple months ago.  Pt states is followed by DSS, this RN attempting to call DSS for more information.

## 2021-01-19 NOTE — ED Notes (Signed)
This RN spoke to [employer], DSS who states pt is not showing up as an [facility] legal guardian case.  Bruce to contact his supervisor and return call to this RN.

## 2021-01-19 NOTE — Discharge Instructions (Signed)
Please call OBGYN on Monday. Take tylenol as needed for your shoulder pain. Return to ER for any worsening.

## 2021-02-10 ENCOUNTER — Emergency Department: Payer: Medicare Other

## 2021-02-10 ENCOUNTER — Other Ambulatory Visit: Payer: Self-pay

## 2021-02-10 ENCOUNTER — Emergency Department
Admission: EM | Admit: 2021-02-10 | Discharge: 2021-02-10 | Disposition: A | Discharge status: Home / Self Care | Payer: Medicare Other | Attending: Emergency Medicine | Admitting: Emergency Medicine

## 2021-02-10 DIAGNOSIS — O99611 Diseases of the digestive system complicating pregnancy, first trimester: Secondary | ICD-10-CM | POA: Diagnosis present

## 2021-02-10 DIAGNOSIS — O219 Vomiting of pregnancy, unspecified: Secondary | ICD-10-CM

## 2021-02-10 DIAGNOSIS — K2100 Gastro-esophageal reflux disease with esophagitis, without bleeding: Secondary | ICD-10-CM | POA: Diagnosis not present

## 2021-02-10 DIAGNOSIS — Z3A01 Less than 8 weeks gestation of pregnancy: Secondary | ICD-10-CM | POA: Insufficient documentation

## 2021-02-10 DIAGNOSIS — R0789 Other chest pain: Secondary | ICD-10-CM

## 2021-02-10 LAB — CBC
HCT: 34.6 % — ABNORMAL LOW (ref 36.0–46.0)
Hemoglobin: 11.7 g/dL — ABNORMAL LOW (ref 12.0–15.0)
MCH: 26.2 pg (ref 26.0–34.0)
MCHC: 33.8 g/dL (ref 30.0–36.0)
MCV: 77.6 fL — ABNORMAL LOW (ref 80.0–100.0)
Platelets: 357 10*3/uL (ref 150–400)
RBC: 4.46 MIL/uL (ref 3.87–5.11)
RDW: 12.8 % (ref 11.5–15.5)
WBC: 10.2 10*3/uL (ref 4.0–10.5)
nRBC: 0 % (ref 0.0–0.2)

## 2021-02-10 LAB — TROPONIN I (HIGH SENSITIVITY): Troponin I (High Sensitivity): 4 ng/L (ref <18)

## 2021-02-10 LAB — COMPREHENSIVE METABOLIC PANEL
ALT: 12 U/L (ref 0–44)
AST: 19 U/L (ref 15–41)
Albumin: 4.1 g/dL (ref 3.5–5.0)
Alkaline Phosphatase: 45 U/L (ref 38–126)
Anion gap: 11 (ref 5–15)
BUN: 13 mg/dL (ref 6–20)
CO2: 22 mmol/L (ref 22–32)
Calcium: 9.5 mg/dL (ref 8.9–10.3)
Chloride: 105 mmol/L (ref 98–111)
Creatinine, Ser: 0.63 mg/dL (ref 0.44–1.00)
GFR, Estimated: >60 mL/min (ref >60)
Glucose, Bld: 92 mg/dL (ref 70–99)
Potassium: 3.6 mmol/L (ref 3.5–5.1)
Sodium: 138 mmol/L (ref 135–145)
Total Bilirubin: 1.5 mg/dL — ABNORMAL HIGH (ref 0.3–1.2)
Total Protein: 7.7 g/dL (ref 6.5–8.1)

## 2021-02-10 LAB — LIPASE, BLOOD: Lipase: 27 U/L (ref 11–51)

## 2021-02-10 MED ORDER — ALUM & MAG HYDROXIDE-SIMETH 200-200-20 MG/5ML PO SUSP
15.0000 mL | Freq: Once | ORAL | Status: AC
  Administered 2021-02-10: 15 mL via ORAL
  Filled 2021-02-10: qty 30

## 2021-02-10 MED ORDER — SODIUM CHLORIDE 0.9 % IV BOLUS
1000.0000 mL | Freq: Once | INTRAVENOUS | Status: AC
  Administered 2021-02-10: 1000 mL via INTRAVENOUS

## 2021-02-10 MED ORDER — OMEPRAZOLE MAGNESIUM 20 MG PO TBEC: 20.0000 mg | DELAYED_RELEASE_TABLET | Freq: Every day | ORAL | 1 refills | Status: DC

## 2021-02-10 MED ORDER — LIDOCAINE VISCOUS HCL 2 % MT SOLN
15.0000 mL | Freq: Once | OROMUCOSAL | Status: AC
  Administered 2021-02-10: 15 mL via ORAL
  Filled 2021-02-10: qty 15

## 2021-02-10 MED ORDER — ONDANSETRON 4 MG PO TBDP: 4.0000 mg | ORAL_TABLET | Freq: Three times a day (TID) | ORAL | 0 refills | Status: DC | PRN

## 2021-02-10 MED ORDER — ONDANSETRON HCL 4 MG/2ML IJ SOLN
4.0000 mg | Freq: Once | INTRAMUSCULAR | Status: AC
  Administered 2021-02-10: 4 mg via INTRAVENOUS
  Filled 2021-02-10: qty 2

## 2021-02-10 MED ORDER — SUCRALFATE 1 GM/10ML PO SUSP: 1.0000 g | Freq: Four times a day (QID) | ORAL | 1 refills | Status: DC

## 2021-02-10 NOTE — ED Notes (Signed)
Pt unwilling to take all of oral med mix but does report dec in discomfort from what she has already taken.

## 2021-02-10 NOTE — ED Provider Notes (Signed)
[facility] Emergency Department Provider Note   ____________________________________________   Event Date/Time   First MD Initiated Contact with Patient 02/10/21 1147     (approximate)  I have reviewed the triage vital signs and the nursing notes.   HISTORY  Chief Complaint Chest Pain and Emesis During Pregnancy    HPI [name] is a 30 y.o. female, G2P1001 with no significant past medical history who presents to the ED complaining of chest pain and vomiting.  Patient reports that for the past 5 days she has been feeling nauseous with multiple episodes of vomiting and difficulty keeping down either liquids or solids.  This is been associated by soreness in the center of her chest, which she describes as a burning.  She denies any fevers or cough, does describe some mild difficulty breathing.  She has not had any abdominal pain, diarrhea, dysuria, or hematuria.  She states she does not have any nausea medicine available at home.  She follows at the [facility] clinic for her obstetric care, had an ultrasound earlier this month showing intrauterine pregnancy.        Past Medical History:  Diagnosis Date  . Depression   . Herpes labialis   . Mild mental retardation     Patient Active Problem List   Diagnosis Date Noted  . Sepsis (HCC) 11/27/2015  . Pyelonephritis 11/27/2015  . AKI (acute kidney injury) (HCC) 11/27/2015    Past Surgical History:  Procedure Laterality Date  . CESAREAN SECTION      Prior to Admission medications   Medication Sig Start Date End Date Taking? Authorizing Provider  ondansetron (ZOFRAN ODT) 4 MG disintegrating tablet Take 1 tablet (4 mg total) by mouth every 8 (eight) hours as needed for nausea or vomiting. 02/10/21  Yes Chesley Noon, MD  sucralfate (CARAFATE) 1 GM/10ML suspension Take 10 mLs (1 g total) by mouth 4 (four) times daily. 02/10/21 02/10/22 Yes Chesley Noon, MD  omeprazole (PRILOSEC OTC) 20 MG tablet  Take 1 tablet (20 mg total) by mouth daily. 02/10/21 02/10/21 Yes Chesley Noon, MD  fluticasone [facility]) 50 MCG/ACT nasal spray Place 2 sprays into both nostrils daily. 08/22/18 12/19/19  Menshew, Charlesetta Ivory, PA-C    Allergies Patient has no known allergies.  History reviewed. No pertinent family history.  Social History Social History   Tobacco Use  . Smoking status: Never Smoker  . Smokeless tobacco: Never Used  Substance Use Topics  . Alcohol use: No  . Drug use: No    Review of Systems  Constitutional: No fever/chills Eyes: No visual changes. ENT: No sore throat. Cardiovascular: Positive for chest pain. Respiratory: Negative for cough, positive for shortness of breath. Gastrointestinal: No abdominal pain.  Positive for nausea and vomiting.  No diarrhea.  No constipation. Genitourinary: Negative for dysuria. Musculoskeletal: Negative for back pain. Skin: Negative for rash. Neurological: Negative for headaches, focal weakness or numbness.  ____________________________________________   PHYSICAL EXAM:  VITAL SIGNS: ED Triage Vitals  Enc Vitals Group     BP 02/10/21 1125 114/81     Pulse Rate 02/10/21 1125 74     Resp 02/10/21 1125 16     Temp 02/10/21 1125 98.5 F (36.9 C)     Temp Source 02/10/21 1125 Oral     SpO2 02/10/21 1125 100 %     Weight 02/10/21 1121 150 lb (68 kg)     Height 02/10/21 1121 5\' 7"  (1.702 m)     Head Circumference --  Peak Flow --      Pain Score 02/10/21 1121 10     Pain Loc --      Pain Edu? --      Excl. in GC? --     Constitutional: Alert and oriented. Eyes: Conjunctivae are normal. Head: Atraumatic. Nose: No congestion/rhinnorhea. Mouth/Throat: Mucous membranes are moist. Neck: Normal ROM Cardiovascular: Normal rate, regular rhythm. Grossly normal heart sounds.  2+ radial pulses bilaterally. Respiratory: Normal respiratory effort.  No retractions. Lungs CTAB. Gastrointestinal: Soft and nontender. No  distention. Genitourinary: deferred Musculoskeletal: No lower extremity tenderness nor edema. Neurologic:  Normal speech and language. No gross focal neurologic deficits are appreciated. Skin:  Skin is warm, dry and intact. No rash noted. Psychiatric: Mood and affect are normal. Speech and behavior are normal.  ____________________________________________   LABS (all labs ordered are listed, but only abnormal results are displayed)  Labs Reviewed  CBC - Abnormal; Notable for the following components:      Result Value   Hemoglobin 11.7 (*)    HCT 34.6 (*)    MCV 77.6 (*)    All other components within normal limits  COMPREHENSIVE METABOLIC PANEL - Abnormal; Notable for the following components:   Total Bilirubin 1.5 (*)    All other components within normal limits  LIPASE, BLOOD  TROPONIN I (HIGH SENSITIVITY)   ____________________________________________  EKG  ED ECG REPORT I, Chesley Noon, the attending physician, personally viewed and interpreted this ECG.   Date: 02/10/2021  EKG Time: 11:24  Rate: 84  Rhythm: normal sinus rhythm  Axis: Normal  Intervals:none  ST&T Change: None   PROCEDURES  Procedure(s) performed (including Critical Care):  Procedures   ____________________________________________   INITIAL IMPRESSION / ASSESSMENT AND PLAN / ED COURSE       30 year old female, G2P1001 at approximately 8 weeks of pregnancy, presents to the ED complaining of 5 days of chest pain along with nausea and vomiting.  She denies any associated abdominal pain and has no tenderness on exam.  She had prior ultrasound showing intrauterine pregnancy, currently denies any vaginal bleeding or discharge.  EKG shows no evidence of arrhythmia or ischemia and troponin is negative, I doubt cardiac etiology for her chest pain.  Pain sounds most typical of GERD, especially association with her recent vomiting.  I doubt PE given her reassuring vital signs.  Additional labs are  unremarkable, patient reports symptoms are improved following Zofran and GI cocktail.  She is appropriate for discharge home and we will start on Zofran along with Carafate for suspected GERD.  She was counseled to follow-up with OB/GYN and to return to the ED for new or worsening symptoms, patient agrees with plan.      ____________________________________________   FINAL CLINICAL IMPRESSION(S) / ED DIAGNOSES  Final diagnoses:  Atypical chest pain  Nausea and vomiting in pregnancy  Gastroesophageal reflux disease with esophagitis without hemorrhage     ED Discharge Orders         Ordered    ondansetron (ZOFRAN ODT) 4 MG disintegrating tablet  Every 8 hours PRN        02/10/21 1338    omeprazole (PRILOSEC OTC) 20 MG tablet  Daily,   Status:  Discontinued        02/10/21 1338    sucralfate (CARAFATE) 1 GM/10ML suspension  4 times daily        02/10/21 1340           Note:  This document was prepared using  [employer] and may include unintentional dictation errors.   Chesley Noon, MD 02/10/21 1345

## 2021-02-10 NOTE — ED Triage Notes (Signed)
Pt states that she has had central chest pain for 4 days- pt states she is also [redacted] weeks pregnant and having trouble keeping anything down

## 2021-02-10 NOTE — ED Triage Notes (Signed)
Pt in via EMS from home. Pt is [redacted] weeks pregnant and has been vomiting for 4 days. Pt had hyperemesis with the first pregnancy  CGB 105, #20 left hand, HR 82

## 2021-02-10 NOTE — ED Notes (Addendum)
Pt has personal phone. [name] called her. [name] briefly updated. States if pt unable to catch bus she will come pick her up. Pt signed printed d/c paperwork.

## 2021-02-10 NOTE — ED Notes (Signed)
Pt states mother used to be her legal guardian before she passed away earlier this year. Tried to contact pt's sister Daloris at [phone] as pt stated she might know who her LG is. Left a voicemail for sister to call this RN back.

## 2021-02-10 NOTE — ED Notes (Signed)
Pt laying calmly in bed; has emesis bag; not currently vomiting; reports has been having CP with shakiness, nausea, and SOB for past week. Denies diarrhea, fever, taste/smell alterations. Skin dry.

## 2021-02-13 ENCOUNTER — Other Ambulatory Visit: Payer: Self-pay

## 2021-02-13 DIAGNOSIS — O210 Mild hyperemesis gravidarum: Secondary | ICD-10-CM | POA: Insufficient documentation

## 2021-02-14 ENCOUNTER — Other Ambulatory Visit: Payer: Self-pay

## 2021-02-14 ENCOUNTER — Ambulatory Visit
Admission: RE | Admit: 2021-02-14 | Discharge: 2021-02-14 | Disposition: A | Discharge status: Home / Self Care | Payer: Medicare Other | Source: Ambulatory Visit

## 2021-02-14 DIAGNOSIS — O210 Mild hyperemesis gravidarum: Secondary | ICD-10-CM | POA: Insufficient documentation

## 2021-02-14 MED ORDER — THIAMINE HCL 100 MG/ML IJ SOLN
INTRAVENOUS | Status: DC
  Filled 2021-02-14: qty 1000

## 2021-02-14 MED ORDER — SODIUM CHLORIDE FLUSH 0.9 % IV SOLN
INTRAVENOUS | Status: AC
  Filled 2021-02-14: qty 10

## 2021-02-14 MED ORDER — ONDANSETRON 8 MG/NS 50 ML IVPB
8.0000 mg | INTRAVENOUS | Status: DC
  Filled 2021-02-14: qty 8

## 2021-02-14 MED ORDER — SODIUM CHLORIDE 0.9 % IV SOLN: INTRAVENOUS | Status: DC

## 2021-02-14 MED ORDER — SODIUM CHLORIDE 0.9 % IV SOLN
8.0000 mg | INTRAVENOUS | Status: DC
  Administered 2021-02-14: 8 mg via INTRAVENOUS
  Filled 2021-02-14: qty 4

## 2021-02-15 DIAGNOSIS — Z3483 Encounter for supervision of other normal pregnancy, third trimester: Secondary | ICD-10-CM | POA: Insufficient documentation

## 2021-02-23 ENCOUNTER — Other Ambulatory Visit: Payer: Self-pay | Admitting: Obstetrics and Gynecology

## 2021-02-23 DIAGNOSIS — O210 Mild hyperemesis gravidarum: Secondary | ICD-10-CM

## 2021-02-23 LAB — OB RESULTS CONSOLE HEPATITIS B SURFACE ANTIGEN: Hepatitis B Surface Ag: NEGATIVE

## 2021-02-23 LAB — OB RESULTS CONSOLE RPR: RPR: NONREACTIVE

## 2021-02-23 LAB — OB RESULTS CONSOLE RUBELLA ANTIBODY, IGM: Rubella: IMMUNE

## 2021-02-23 LAB — OB RESULTS CONSOLE HIV ANTIBODY (ROUTINE TESTING): HIV: NONREACTIVE

## 2021-02-23 LAB — OB RESULTS CONSOLE VARICELLA ZOSTER ANTIBODY, IGG: Varicella: IMMUNE

## 2021-02-27 ENCOUNTER — Other Ambulatory Visit: Payer: Self-pay

## 2021-02-27 ENCOUNTER — Ambulatory Visit
Admission: RE | Admit: 2021-02-27 | Discharge: 2021-02-27 | Disposition: A | Discharge status: Home / Self Care | Payer: Medicare Other | Source: Ambulatory Visit | Attending: Obstetrics and Gynecology | Admitting: Obstetrics and Gynecology

## 2021-02-27 DIAGNOSIS — O210 Mild hyperemesis gravidarum: Secondary | ICD-10-CM | POA: Insufficient documentation

## 2021-02-27 DIAGNOSIS — Z3A10 10 weeks gestation of pregnancy: Secondary | ICD-10-CM | POA: Insufficient documentation

## 2021-02-27 MED ORDER — ONDANSETRON HCL 4 MG/2ML IJ SOLN: 4.0000 mg | Freq: Four times a day (QID) | INTRAMUSCULAR | Status: DC | PRN

## 2021-02-27 MED ORDER — THIAMINE HCL 100 MG/ML IJ SOLN
INTRAVENOUS | Status: DC
  Filled 2021-02-27: qty 1000

## 2021-02-28 ENCOUNTER — Telehealth: Payer: Self-pay | Admitting: Licensed Clinical Social Worker

## 2021-02-28 NOTE — Telephone Encounter (Signed)
-----   Message from Ellison Carwin sent at 02/28/2021 11:23 AM EDT ----- Regarding: MH referral Marchelle Folks, this is the member that I informed you of earlier this am. This member has a positive depression screening but reports only currently experiencing anxiety and stress over social needs, in which we are currently addressing.  Member is open to speaking with you. Please f/u at your earliest convenience.  Thanks,

## 2021-03-05 ENCOUNTER — Other Ambulatory Visit: Payer: Self-pay

## 2021-03-05 DIAGNOSIS — O210 Mild hyperemesis gravidarum: Secondary | ICD-10-CM

## 2021-03-07 ENCOUNTER — Ambulatory Visit
Admission: RE | Admit: 2021-03-07 | Discharge: 2021-03-07 | Disposition: A | Discharge status: Home / Self Care | Payer: Medicare Other | Source: Ambulatory Visit | Attending: Obstetrics and Gynecology | Admitting: Obstetrics and Gynecology

## 2021-03-07 ENCOUNTER — Ambulatory Visit: Payer: Self-pay | Admitting: Licensed Clinical Social Worker

## 2021-03-07 ENCOUNTER — Other Ambulatory Visit: Payer: Self-pay

## 2021-03-07 DIAGNOSIS — O210 Mild hyperemesis gravidarum: Secondary | ICD-10-CM

## 2021-03-07 MED ORDER — ONDANSETRON HCL 4 MG/2ML IJ SOLN
INTRAMUSCULAR | Status: AC
  Filled 2021-03-07: qty 2

## 2021-03-07 MED ORDER — THIAMINE HCL 100 MG/ML IJ SOLN
INTRAVENOUS | Status: DC
  Filled 2021-03-07: qty 1000

## 2021-03-07 MED ORDER — LACTATED RINGERS IV BOLUS
1000.0000 mL | INTRAVENOUS | Status: DC
  Administered 2021-03-07: 1000 mL via INTRAVENOUS

## 2021-03-07 MED ORDER — ONDANSETRON HCL 4 MG/2ML IJ SOLN: 4.0000 mg | Freq: Once | INTRAMUSCULAR | Status: DC

## 2021-03-23 NOTE — Addendum Note (Signed)
Encounter addended by: Rosalita Chessman, RN on: 03/23/2021 4:24 PM  Actions taken: Charge Capture section accepted

## 2021-03-29 ENCOUNTER — Encounter: Payer: Self-pay | Admitting: Licensed Clinical Social Worker

## 2021-03-29 ENCOUNTER — Ambulatory Visit: Payer: Medicare Other | Admitting: Licensed Clinical Social Worker

## 2021-03-29 DIAGNOSIS — F70 Mild intellectual disabilities: Secondary | ICD-10-CM

## 2021-03-29 DIAGNOSIS — F331 Major depressive disorder, recurrent, moderate: Secondary | ICD-10-CM
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
## 2021-04-04 ENCOUNTER — Ambulatory Visit: Payer: Medicare Other | Admitting: Licensed Clinical Social Worker

## 2021-04-04 ENCOUNTER — Telehealth: Payer: Self-pay | Admitting: Licensed Clinical Social Worker

## 2021-04-04 DIAGNOSIS — F331 Major depressive disorder, recurrent, moderate: Secondary | ICD-10-CM

## 2021-04-04 DIAGNOSIS — F70 Mild intellectual disabilities: Secondary | ICD-10-CM

## 2021-04-04 NOTE — Telephone Encounter (Signed)
error 

## 2021-04-04 NOTE — Progress Notes (Signed)
Counselor/Therapist Progress Note  Patient ID: [name], MRN: [id number],    Date: 04/04/2021  Time Spent: 45 minutes   Treatment Type: Psychotherapy patient's 31yo son present in session  Reported Symptoms:  low mood   Mental Status Exam:  Appearance:   Casual and Well Groomed     Behavior:  Appropriate and Sharing  Motor:  Normal  Speech/Language:   Normal Rate  Affect:  Appropriate, Congruent, and Full Range  Mood:  normal  Thought process:  normal  Thought content:    WNL  Sensory/Perceptual disturbances:    WNL  Orientation:  oriented to person, place, time/date, situation, and day of week  Attention:  Good  Concentration:  Fair  Memory:  WNL  Fund of knowledge:   Fair  Insight:    Fair  Judgment:   Fair  Impulse Control:  Fair   Risk Assessment: Danger to Self:  No Self-injurious Behavior: No Danger to Others: No Duty to Warn:no Physical Aggression / Violence:No  Access to Firearms a concern: No  Gang Involvement:No   Subjective: Patient was engaged and cooperative throughout the session using time to discuss thoughts, feelings and treatment plan.  Patient voices continued motivation for treatment reporting that she "like" talking with LCSW. Patient is likely to find some benefit from future treatment because she is motivated to manage depressed mood related to loss of mother and subsequent consequences.   Interventions:  Increase emotional regulation through a combination of basic CBT/DBT skills Checked in with patient and reviewed previous session, including assessment and goal of treatment. Explored patient's goal of treatment and worked collaboratively to develop treatment plan to teach patient appropriate coping skills. Discussed patient challenges with anger due to conflict in relationships. Taught patient to count to 5 and then to close eyes or have a soft lowered gaze and take 5 deep breathing, inhaling through the nose and exhaling through the mouth.  Patient voiced plans to practice this new exercise daily.  Provided support through active listening, validation of feelings, and highlighted patient's strengths.  Diagnosis:   ICD-10-CM   1. Major depressive disorder, recurrent episode, moderate (HCC) with Anxious distress  F33.1     2. Mild intellectual disabilities  F70      Plan: Patient's goal of treatment is to get all the help she can get and with her depression.  Treatment Target: Increase coping skills to increase emotions regulation  Breathing exercises  Self-care - nutrition, sleep, exercise  Teach distress tolerance techniques - "what helps me"  Listen to music Go to the park Go on a walk    Kathreen Cosier, LCSW

## 2021-04-12 ENCOUNTER — Ambulatory Visit: Payer: Medicare Other | Admitting: Licensed Clinical Social Worker

## 2021-04-16 ENCOUNTER — Encounter: Payer: Self-pay | Admitting: Licensed Clinical Social Worker

## 2021-04-16 NOTE — Progress Notes (Signed)
LCSW spoke with Maryann Alar, CPS In-Home Social Worker, [facility] Department of Social Services regarding treatment being provided by [employer]. A consent for release was shared with LCSW from CPS. LCSW provided information on patient's Assessment and treatment plan.  Tiffany's cell [phone]. Tiffany would like her to work on Engineer, agricultural and work on developing a positive support system.

## 2021-04-19 ENCOUNTER — Ambulatory Visit: Payer: Medicare Other | Admitting: Licensed Clinical Social Worker

## 2021-04-19 DIAGNOSIS — F70 Mild intellectual disabilities: Secondary | ICD-10-CM

## 2021-04-19 DIAGNOSIS — F331 Major depressive disorder, recurrent, moderate: Secondary | ICD-10-CM

## 2021-04-19 NOTE — Progress Notes (Signed)
Counselor/Therapist Progress Note  Patient ID: [name], MRN: [id number],    Date: 04/19/2021  Time Spent: 45 minutes son present in session    Treatment Type: Individual Therapy  Reported Symptoms:  mild depressive symptoms, sadness; anxious thoughts  Mental Status Exam:  Appearance:   Casual     Behavior:  Appropriate and Sharing  Motor:  Normal  Speech/Language:   Normal Rate  Affect:  Appropriate, Congruent, and Full Range  Mood:  Irritated   Thought process:  normal  Thought content:    WNL  Sensory/Perceptual disturbances:    WNL  Orientation:  oriented to person, place, time/date, situation, and day of week  Attention:  Good  Concentration:  Good  Memory:  WNL  Fund of knowledge:   Good  Insight:    Fair  Judgment:   Fair  Impulse Control:  Fair   Risk Assessment: Danger to Self:  No Self-injurious Behavior: No Danger to Others: No Duty to Warn:no Physical Aggression / Violence:No  Access to Firearms a concern: No  Gang Involvement:No   Subjective: Patient was engaged and cooperative throughout the session using time effectively to discuss thoughts and feelings. Patient voices continued motivation for treatment and reports use and benefit from intentional breathing exercises. Patient is likely to benefit from future treatment because she remains motivated to manage symptoms and reports benefit of regular sessions.   Interventions: Psychologist, occupational and Interpersonal Checked in with patient regarding her week. Reviewed previous session regarding breathing exercises. Praised patient for use of the exercises. Engaged patient in processing current psychosocial stressors, moving and interactions with people in her environment. Validated patient's feelings of frustration. Reviewed strategies that help patient to emotionally regulate, including walking, listening to gospel music, and focusing on her goals/needs for her son and for herself. Provided support through  active listening, validation of feelings, and highlighted patient's strengths.   Diagnosis:   ICD-10-CM   1. Major depressive disorder, recurrent episode, moderate (HCC) with Anxious distress  F33.1     2. Mild intellectual disabilities  F70      Plan: Patient's goal of treatment is to get all the help she can get and with her depression.   Treatment Target: Increase coping skills to increase emotions regulation Breathing exercises Self-care - nutrition, sleep, exercise Teach distress tolerance techniques - "what helps me" Listen to music Go to the park Go on a walk    Future Appointments  Date Time Provider Department Center  05/10/2021  4:00 PM Kathreen Cosier, LCSW [facility] None    Kathreen Cosier, [place]

## 2021-05-03 ENCOUNTER — Ambulatory Visit: Payer: Medicare Other | Admitting: Licensed Clinical Social Worker

## 2021-05-10 ENCOUNTER — Ambulatory Visit: Payer: Medicare Other | Admitting: Licensed Clinical Social Worker

## 2021-05-10 DIAGNOSIS — F331 Major depressive disorder, recurrent, moderate: Secondary | ICD-10-CM

## 2021-05-10 DIAGNOSIS — F70 Mild intellectual disabilities: Secondary | ICD-10-CM

## 2021-05-10 NOTE — Progress Notes (Signed)
Counselor/Therapist Progress Note  Patient ID: [name], MRN: [id number],    Date: 05/10/2021  Time Spent: 45 minutes    Treatment Type: Psychotherapy  Reported Symptoms:  overall mood improvement   Mental Status Exam:  Appearance:   Casual, Neat, and Well Groomed     Behavior:  Appropriate and Sharing  Motor:  Normal  Speech/Language:   Clear and Coherent and Normal Rate  Affect:  Appropriate, Congruent, and Full Range  Mood:  normal  Thought process:  goal directed  Thought content:    WNL  Sensory/Perceptual disturbances:    WNL  Orientation:  oriented to person, place, time/date, situation, and day of week  Attention:  Good  Concentration:  Good  Memory:  WNL  Fund of knowledge:   Good  Insight:    Good  Judgment:   Good  Impulse Control:  Good   Risk Assessment: Danger to Self:  No Self-injurious Behavior: No Danger to Others: No Duty to Warn:no Physical Aggression / Violence:No  Access to Firearms a concern: No  Gang Involvement:No   Subjective: Patient was engaged and cooperative throughout the session using time effectively to discuss thoughts and feelings. Patient voices continued motivation for treatment and understanding of the importance of managing emotions. Patient is likely to benefit from future treatment because she remains motivated to manage symptoms and reports benefit of regular sessions.    Interventions: Cognitive Behavioral Therapy, Psychologist, occupational, and Psycho-education/Bibliotherapy Checked in with patient regarding her week. Engaged patient in processing current psychosocial stressors, overall mood improvement with some frustration due to challenges within relationships. Discussed patient's use of helpful thoughts to deter her from responding to her anger/irritation.  Provided support through active listening, validation of feelings, and highlighted patient's strengths.   Diagnosis:   ICD-10-CM   1. Major depressive disorder,  recurrent episode, moderate (HCC) with Anxious distress  F33.1     2. Mild intellectual disabilities  F70      Plan: Patient's goal of treatment is to get all the help she can get and with her depression.   Treatment Target: Increase coping skills to increase emotions regulation Breathing exercises Self-care - nutrition, sleep, exercise Teach distress tolerance techniques - "what helps me" Listen to music Go to the park Go on a walk    Future Appointments  Date Time Provider Department Center  05/30/2021 10:00 AM Kathreen Cosier, LCSW [facility] None   Kathreen Cosier, LCSW

## 2021-05-30 ENCOUNTER — Ambulatory Visit: Payer: Medicare Other | Admitting: Licensed Clinical Social Worker

## 2021-05-30 DIAGNOSIS — F331 Major depressive disorder, recurrent, moderate: Secondary | ICD-10-CM

## 2021-05-30 DIAGNOSIS — F70 Mild intellectual disabilities: Secondary | ICD-10-CM

## 2021-05-30 NOTE — Progress Notes (Addendum)
Counselor/Therapist Progress Note  Patient ID: [name], MRN: [id number],    Date: 05/30/2021  Time Spent: 1 hour   Treatment Type: Individual Therapy  Reported Symptoms: Obsessive thinking, Verbal aggression, and anger, irritability, anxiety, anxious thoughs  Mental Status Exam:  Appearance:   Casual and Neat     Behavior:  Appropriate and Sharing  Motor:  Normal  Speech/Language:   Clear and Coherent and Normal Rate  Affect:  Appropriate, Congruent, and Full Range  Mood:  angry  Thought process:  normal  Thought content:    WNL  Sensory/Perceptual disturbances:    WNL  Orientation:  oriented to person, place, time/date, situation, and day of week  Attention:  Good  Concentration:  Good  Memory:  WNL  Fund of knowledge:   Good  Insight:    Fair  Judgment:   Good  Impulse Control:  Good   Risk Assessment: Danger to Self:  No Self-injurious Behavior: No Danger to Others: No  Duty to Warn:no Physical Aggression / Violence:No  Access to Firearms a concern: No  Gang Involvement:No   Subjective: Patient was engaged and cooperative throughout the session using time effectively to discuss thoughts, feelings, and plans to cope with current stressors. Patient voices continued motivation for treatment and understanding of anger depressive symptoms and anxiety. Patient is likely to benefit from future treatment because she remains motivated to decrease symptoms and improve functioning and reports benefit of sessions.   Interventions: Cognitive Behavioral Therapy and Motivational Interviewing Checked in with patient regarding her week. Engaged patient in processing current psychosocial stressors, increased symptoms and anger due to conflict in relationship with boyfriend/father of baby. Explored patient's perceptions, identifying unhelpful thinking and assisting patient in identifying evidence for or against her thoughts. Engaged patient in making a plan to assist with her  managing her anger, including making progress towards securing housing- going to rental place and reaching out to CPS worker; Education officer, community, Esmeralda Links; try to not think the worst and to focus on her goals; spend time with her brother as she identified him as someone that has been helpful in this situation. LCSW offered a check-in call and patient declined. Provided support through active listening, validation of feelings, and highlighted patient's strengths.   Diagnosis:   ICD-10-CM   1. Major depressive disorder, recurrent episode, moderate (HCC) with Anxious distress  F33.1     2. Mild intellectual disabilities  F70       Plan: Patient's goal of treatment is to get all the help she can get and with her depression.   Treatment Target: Increase coping skills to increase emotions regulation Breathing exercises Self-care - nutrition, sleep, exercise Teach distress tolerance techniques - "what helps me" Listen to music Go to the park Go on a walk    Future Appointments  Date Time Provider Department Center  06/06/2021  3:30 PM Kathreen Cosier, LCSW [facility] None    Lelon Huh, MSW [facility] intern present in session   Kathreen Cosier, LCSW

## 2021-06-06 ENCOUNTER — Ambulatory Visit: Payer: Medicare Other | Admitting: Licensed Clinical Social Worker

## 2021-06-06 DIAGNOSIS — F331 Major depressive disorder, recurrent, moderate: Secondary | ICD-10-CM

## 2021-06-06 DIAGNOSIS — F70 Mild intellectual disabilities: Secondary | ICD-10-CM

## 2021-06-06 NOTE — Progress Notes (Signed)
Counselor/Therapist Progress Note  Patient ID: [name], MRN: [id number],    Date: 06/06/2021  Time Spent: 30 minutes    Treatment Type: Individual Therapy  Reported Symptoms:  depressed mood, mild anxiety, worries  Mental Status Exam:  Appearance:   Casual, Neat, and Well Groomed     Behavior:  Appropriate and Sharing  Motor:  Normal  Speech/Language:   Clear and Coherent and Normal Rate  Affect:  Appropriate and Congruent  Mood:  normal  Thought process:  normal  Thought content:    WNL  Sensory/Perceptual disturbances:    WNL  Orientation:  oriented to person, place, time/date, and situation  Attention:  Good  Concentration:  Good  Memory:  WNL  Fund of knowledge:   Good  Insight:    Good  Judgment:   Good  Impulse Control:  Good   Risk Assessment: Danger to Self:  No Self-injurious Behavior: No Danger to Others: No Duty to Warn:no Physical Aggression / Violence:No  Access to Firearms a concern: No  Gang Involvement:No   Subjective: Patient was engaged and cooperative throughout the session using time to discuss thoughts and feelings. Patient voices continued motivation for treatment and understanding of depression and anxiety issues related to stressors. Patient is likely to benefit from future treatment because she remains motivated to manage symptoms and reports benefit from talk therapy.    Interventions: Cognitive Behavioral Therapy Checked in with patient regarding her week and previous sessions stressors. Encouraged patient to reach out to DSS worker and to care manager regarding moving. Highlighted patient's success in regulating anger/mood. Engaged patient in processing current psychosocial stressors, low mood and sadness due to relationship challenges with boyfriend. Validated patient's feelings of sadness and explored origins of these feelings within the relationship. Assisted patient in noticing support persons in her life and encouraged patient to  continue strategies such as listening to music and taking bus rides to help manage her stress. Provided support through active listening, validation of feelings, and highlighted patient's strengths.   Diagnosis:   ICD-10-CM   1. Major depressive disorder, recurrent episode, moderate (HCC) with Anxious distress  F33.1     2. Mild intellectual disabilities  F70      Plan:  Patient's goal of treatment is to get all the help she can get and with her depression.   Treatment Target: Increase coping skills to increase emotions regulation Breathing exercises Self-care - nutrition, sleep, exercise Teach distress tolerance techniques - "what helps me" Listen to music Go to the park Go on a walk     Future Appointments  Date Time Provider Department Center  06/21/2021 11:00 AM Kathreen Cosier, LCSW [facility] None    Kathreen Cosier, LCSW

## 2021-06-12 ENCOUNTER — Observation Stay
Admission: EM | Admit: 2021-06-12 | Discharge: 2021-06-12 | Disposition: A | Discharge status: Home / Self Care | Payer: Medicare Other | Attending: Obstetrics and Gynecology | Admitting: Obstetrics and Gynecology

## 2021-06-12 ENCOUNTER — Other Ambulatory Visit: Payer: Self-pay

## 2021-06-12 ENCOUNTER — Observation Stay: Payer: Medicare Other

## 2021-06-12 DIAGNOSIS — O99212 Obesity complicating pregnancy, second trimester: Secondary | ICD-10-CM | POA: Insufficient documentation

## 2021-06-12 DIAGNOSIS — Z6832 Body mass index (BMI) 32.0-32.9, adult: Secondary | ICD-10-CM | POA: Insufficient documentation

## 2021-06-12 DIAGNOSIS — O99012 Anemia complicating pregnancy, second trimester: Secondary | ICD-10-CM | POA: Diagnosis not present

## 2021-06-12 DIAGNOSIS — O26899 Other specified pregnancy related conditions, unspecified trimester: Secondary | ICD-10-CM

## 2021-06-12 DIAGNOSIS — Z3A25 25 weeks gestation of pregnancy: Secondary | ICD-10-CM | POA: Insufficient documentation

## 2021-06-12 DIAGNOSIS — R1030 Lower abdominal pain, unspecified: Secondary | ICD-10-CM | POA: Insufficient documentation

## 2021-06-12 DIAGNOSIS — O26892 Other specified pregnancy related conditions, second trimester: Principal | ICD-10-CM | POA: Insufficient documentation

## 2021-06-12 DIAGNOSIS — E669 Obesity, unspecified: Secondary | ICD-10-CM | POA: Diagnosis not present

## 2021-06-12 DIAGNOSIS — R109 Unspecified abdominal pain: Secondary | ICD-10-CM | POA: Diagnosis present

## 2021-06-12 LAB — URINALYSIS, COMPLETE (UACMP) WITH MICROSCOPIC
Bilirubin Urine: NEGATIVE
Glucose, UA: NEGATIVE mg/dL
Hgb urine dipstick: NEGATIVE
Ketones, ur: NEGATIVE mg/dL
Leukocytes,Ua: NEGATIVE
Nitrite: NEGATIVE
Protein, ur: NEGATIVE mg/dL
Specific Gravity, Urine: 1.023 (ref 1.005–1.030)
pH: 7 (ref 5.0–8.0)

## 2021-06-12 LAB — WET PREP, GENITAL
Clue Cells Wet Prep HPF POC: NONE SEEN
Sperm: NONE SEEN
Trich, Wet Prep: NONE SEEN
Yeast Wet Prep HPF POC: NONE SEEN

## 2021-06-12 MED ORDER — ACETAMINOPHEN 325 MG PO TABS
650.0000 mg | ORAL_TABLET | Freq: Four times a day (QID) | ORAL | Status: DC | PRN
  Administered 2021-06-12: 650 mg via ORAL
  Filled 2021-06-12: qty 2

## 2021-06-12 NOTE — Progress Notes (Signed)
Dinner tray received.

## 2021-06-12 NOTE — OB Triage Note (Signed)
Pt. reports to Labor & Delivery today for lower abdominal pain that began yesterday. Pt. reports her pain as a constant 10/10 and cramping in nature, and states that it is getting progressively worse since yesterday. She denies leaking of fluid or vaginal bleeding; reports positive fetal movement. External [place] and [place] applied; initial FHR 145bpm. Vital signs WNL. Will continue to assess.

## 2021-06-12 NOTE — Discharge Summary (Signed)
Patient ID: [name] MRN: [id number] DOB/AGE: 04/06/1991 29 y.o.  Admit date: 06/12/2021 Discharge date: 06/12/2021  Admission Diagnoses: 29yo G2P1 at [redacted]w[redacted]d presents with lower abdominal pain  Discharge Diagnoses: Pain resolved with Tylenol  Factors complicating pregnancy: 1. Obesity in pregnancy, BMI 32 2. History of LTCS for breech 3. History of HSV  4. Intellectual disability and social concerns  6. Anemia in pregnancy 7. Depression  Prenatal Procedures: none  Consults: None  Significant Diagnostic Studies:  Results for orders placed or performed during the hospital encounter of 06/12/21 (from the past 168 hour(s))  Urinalysis, Complete w Microscopic Urine, Clean Catch   Collection Time: 06/12/21  5:58 PM  Result Value Ref Range   Color, Urine YELLOW (A) YELLOW   APPearance HAZY (A) CLEAR   Specific Gravity, Urine 1.023 1.005 - 1.030   pH 7.0 5.0 - 8.0   Glucose, UA NEGATIVE NEGATIVE mg/dL   Hgb urine dipstick NEGATIVE NEGATIVE   Bilirubin Urine NEGATIVE NEGATIVE   Ketones, ur NEGATIVE NEGATIVE mg/dL   Protein, ur NEGATIVE NEGATIVE mg/dL   Nitrite NEGATIVE NEGATIVE   Leukocytes,Ua NEGATIVE NEGATIVE   RBC / HPF 0-5 0 - 5 RBC/hpf   WBC, UA 0-5 0 - 5 WBC/hpf   Bacteria, UA RARE (A) NONE SEEN   Squamous Epithelial / LPF 6-10 0 - 5   Mucus PRESENT   Wet prep, genital   Collection Time: 06/12/21  6:23 PM  Result Value Ref Range   Yeast Wet Prep HPF POC NONE SEEN NONE SEEN   Trich, Wet Prep NONE SEEN NONE SEEN   Clue Cells Wet Prep HPF POC NONE SEEN NONE SEEN   WBC, Wet Prep HPF POC FEW (A) NONE SEEN   Sperm NONE SEEN     Treatments: analgesia: acetaminophen and oral hydration  Hospital Course:  This is a 30 y.o. G2P1001 with IUP at [redacted]w[redacted]d seen for lower abdominal pain.  No leaking of fluid and no bleeding.    Pain resolved with Tylenol U/s showed cervical length of 4-5 cm  She was observed, fetal heart rate monitoring remained reassuring, and she had no  signs/symptoms of progressing preterm labor or other maternal-fetal concerns.  She was deemed stable for discharge to home with outpatient follow up.  Discharge Physical Exam:  BP 110/67 (BP Location: Left Arm)   Pulse 79   Temp 98.5 F (36.9 C) (Oral)   Resp 16   LMP 12/13/2020   General: NAD CV: RRR Pulm: CTABL, nl effort ABD: s/nd/nt, gravid DVT Evaluation: LE non-ttp, no evidence of DVT on exam.  FHT reassuring for GA age   TOCO: quiet SVE: deferred      Discharge Condition: Stable  Disposition: Discharge disposition: 01-Home or Self Care       Allergies as of 06/12/2021   No Known Allergies      Medication List     STOP taking these medications    ondansetron 4 MG disintegrating tablet Commonly known as: Zofran ODT   sucralfate 1 GM/10ML suspension Commonly known as: Carafate       TAKE these medications    ferrous sulfate 325 (65 FE) MG tablet Take 325 mg by mouth daily with breakfast.   prenatal multivitamin Tabs tablet Take 1 tablet by mouth daily at 12 noon.        Follow-up Information     Haroldine Laws, CNM Follow up on 06/25/2021.   Specialty: Certified Nurse Midwife Contact information: [address] [place]  [id number] [phone]                 Signed:  Haroldine Laws, CNM 06/12/2021 10:13 PM

## 2021-06-21 ENCOUNTER — Ambulatory Visit: Payer: Medicare Other | Admitting: Licensed Clinical Social Worker

## 2021-06-21 DIAGNOSIS — F70 Mild intellectual disabilities: Secondary | ICD-10-CM

## 2021-06-21 DIAGNOSIS — F331 Major depressive disorder, recurrent, moderate: Secondary | ICD-10-CM

## 2021-06-21 NOTE — Progress Notes (Signed)
Counselor/Therapist Progress Note  Patient ID: [name], MRN: [id number],    Date: 06/21/2021  Time Spent: 45 minutes   Treatment Type: Individual Therapy  Reported Symptoms:  mild anxiety, stress   Mental Status Exam:  Appearance:   Casual, Neat, and Well Groomed     Behavior:  Appropriate and Sharing  Motor:  Normal  Speech/Language:   Clear and Coherent and Normal Rate  Affect:  Appropriate, Congruent, and Full Range  Mood:  normal  Thought process:  normal  Thought content:    WNL  Sensory/Perce.ambhptual disturbances:    WNL  Orientation:  oriented to person, place, time/date, situation, and day of week  Attention:  Good  Concentration:  Good  Memory:  WNL  Fund of knowledge:   Good  Insight:    Fair  Judgment:   Fair  Impulse Control:  Good   Risk Assessment: Danger to Self:  No Self-injurious Behavior: No Danger to Others: No Duty to Warn:no Physical Aggression / Violence:No  Access to Firearms a concern: No  Gang Involvement:No   Subjective: Patient was engaged and cooperative throughout the session using time to discuss thoughts and feelings. Patient was receptive to feedback and intervention from LCSW. Patient voices doing well overall and reports continued motivation for treatment and benefit of sessions.      Interventions:  person centered  Checked in with patient regarding her week. Reviewed previous session regarding housing issues, and encouraged patient to continue to reach out to care manager and DSS worker. LCSW assisted patient in processing her thoughts and emotions about what she has experienced in her housing situation, financial issue, and struggles related to current relationship with boyfriend/FOB. Validated patient's feeling of frustration, identifying origins of these feelings. LCSW encouraged patient to notice what things are going well and assisted patient in identifying these things. Encouraged patient to continue to engage in supportive  relationships.  Provided support through active listening, validation of feelings, and highlighted patient's strengths of continuing to stay out of trouble.    Diagnosis:   ICD-10-CM   1. Major depressive disorder, recurrent episode, moderate (HCC) with Anxious distress  F33.1     2. Mild intellectual disabilities  F70      Plan: Patient's goal of treatment is to get all the help she can get and with her depression.   Treatment Target: Increase coping skills to increase emotions regulation Breathing exercises Self-care - nutrition, sleep, exercise Teach distress tolerance techniques - "what helps me" Listen to music Go to the park Go on a walk     LCSW became aware on 06/07/21 that patient may have a guardian. However, this has not been confirmed as of today and feel it is in the patient's best interest to continue to provide support and care.   Future Appointments  Date Time Provider Department Center  06/28/2021  4:00 PM Kathreen Cosier, LCSW [facility] None    Kathreen Cosier, [place]

## 2021-06-28 ENCOUNTER — Ambulatory Visit: Payer: Medicare Other | Admitting: Licensed Clinical Social Worker

## 2021-06-28 DIAGNOSIS — F70 Mild intellectual disabilities: Secondary | ICD-10-CM

## 2021-06-28 DIAGNOSIS — F331 Major depressive disorder, recurrent, moderate: Secondary | ICD-10-CM

## 2021-06-28 NOTE — Progress Notes (Signed)
Counselor/Therapist Progress Note  Patient ID: [name], MRN: [id number],    Date: 06/28/2021  Time Spent: 40 minutes   Treatment Type: Individual Therapy  Reported Symptoms:  Stable mood  Mental Status Exam:  Appearance:   Casual and Well Groomed     Behavior:  Appropriate and Sharing  Motor:  Normal  Speech/Language:   Clear and Coherent and Normal Rate  Affect:  Appropriate, Congruent, and Full Range  Mood:  normal  Thought process:  normal  Thought content:    WNL  Sensory/Perceptual disturbances:    WNL  Orientation:  oriented to person, place, time/date, and situation  Attention:  Good  Concentration:  Good  Memory:  WNL  Fund of knowledge:   Good  Insight:    Good  Judgment:   Good  Impulse Control:  Good   Risk Assessment: Danger to Self:  No Self-injurious Behavior: No Danger to Others: No Duty to Warn:no Physical Aggression / Violence:No  Access to Firearms a concern: No  Gang Involvement:No   Subjective: Patient was engaged and cooperative throughout the session using time effectively to discuss thoughts and feelings. Patient was receptive to feedback from LCSW. Patient voices continued motivation for services and understanding of depression issues. Patient is likely to benefit from future treatment because they remain motivated to manage symptoms and improve functioning.   Interventions:  person centered therapy Checked in with patient regarding their week. LCSW assisted patient in processing their thoughts and emotions about what they have experienced regarding moving and with her boyfriend. LCSW praised patient for continuing to mange feelings of frustration by focusing her attention on positive things. LCSW provided support through active listening, validation of feelings, and highlighted patient's strengths.    Diagnosis:   ICD-10-CM   1. Major depressive disorder, recurrent episode, moderate (HCC) with Anxious distress  F33.1     2. Mild  intellectual disabilities  F70      Plan: Patient's goal of treatment is to get all the help she can get and with her depression.   Treatment Target: Increase coping skills to increase emotions regulation Breathing exercises Self-care - nutrition, sleep, exercise Teach distress tolerance techniques - "what helps me" Listen to music Go to the park Go on a walk       LCSW became aware on 06/07/21 that patient may have a guardian. However, this has not been confirmed as of today and feel it is in the patient's best interest to continue to provide support and care.   Future Appointments  Date Time Provider Department Center  07/19/2021  9:30 AM Kathreen Cosier, LCSW [facility] None    Kathreen Cosier, [place]

## 2021-07-19 ENCOUNTER — Ambulatory Visit: Payer: Medicare Other | Admitting: Licensed Clinical Social Worker

## 2021-07-19 DIAGNOSIS — F331 Major depressive disorder, recurrent, moderate: Secondary | ICD-10-CM

## 2021-07-19 DIAGNOSIS — F70 Mild intellectual disabilities: Secondary | ICD-10-CM

## 2021-07-19 NOTE — Progress Notes (Signed)
Counselor/Therapist Progress Note  Patient ID: [name], MRN: [id number],    Date: 07/19/2021  Time Spent: 35 minutes   Treatment Type: Individual Therapy  Reported Symptoms:  overall mood stability; irritability, low mood   Mental Status Exam:  Appearance:   Casual     Behavior:  Appropriate and Sharing  Motor:  Normal  Speech/Language:   Clear and Coherent and Normal Rate  Affect:  Appropriate, Congruent, and Full Range  Mood:  normal  Thought process:  normal  Thought content:    WNL  Sensory/Perceptual disturbances:    WNL  Orientation:  oriented to person, place, time/date, and situation  Attention:  Good  Concentration:  Good  Memory:  WNL  Fund of knowledge:   Good  Insight:    Fair  Judgment:   Fair  Impulse Control:  Good   Risk Assessment: Danger to Self:  No Self-injurious Behavior: No Danger to Others: No Duty to Warn:no Physical Aggression / Violence:No  Access to Firearms a concern: No  Gang Involvement:No   Subjective: Patient was engaged and cooperative throughout the session using time effectively to discuss thoughts and feelings. Patient was receptive to feedback from LCSW. Patient voices continued motivation for treatment and remains motivated to manage symptoms. Patient reports benefit of regular sessions.        Interventions:  Person Centered Checked in with patient regarding her week. LCSW assisted patient in processing their thoughts and emotions related to multiple psychosocial stressors, including relationship stressors with boyfriend, her community, and grief related to loss of mom.  Provided support through active listening, validation of feelings, and highlighted patient's strengths.    Diagnosis:   ICD-10-CM   1. Major depressive disorder, recurrent episode, moderate (HCC) with Anxious distress  F33.1     2. Mild intellectual disabilities  F70      Plan: Patient's goal of treatment is to get all the help she can get and with her  depression.   Treatment Target: Increase coping skills to increase emotions regulation Breathing exercises Self-care - nutrition, sleep, exercise Teach distress tolerance techniques - "what helps me" Listen to music Go to the park Go on a walk      LCSW became aware on 06/07/21 that patient may have a guardian. However, this has not been confirmed as of today and feel it is in the patient's best interest to continue to provide support and care.   Future Appointments  Date Time Provider Department Center  08/01/2021 12:30 PM Kathreen Cosier, LCSW [facility] None  09/03/2021  8:00 AM [facility]2 [facility] None  09/11/2021  8:30 AM [facility] None   Lelon Huh, MSW [facility] intern present with patient's verbal consent.   Kathreen Cosier, LCSW

## 2021-08-01 ENCOUNTER — Ambulatory Visit: Payer: Medicare Other | Admitting: Licensed Clinical Social Worker

## 2021-08-01 DIAGNOSIS — F331 Major depressive disorder, recurrent, moderate: Secondary | ICD-10-CM

## 2021-08-01 DIAGNOSIS — F70 Mild intellectual disabilities: Secondary | ICD-10-CM

## 2021-08-01 NOTE — Progress Notes (Signed)
Counselor/Therapist Progress Note  Patient ID: [name], MRN: [id number],    Date: 08/01/2021  Time Spent: 20 minutes   Treatment Type: Individual Therapy  Reported Symptoms:  stable mood  Mental Status Exam:  Appearance:   Well Groomed     Behavior:  Appropriate and minimal engagement  Motor:  Normal  Speech/Language:   Clear and Coherent  Affect:  Appropriate and Congruent  Mood:  normal  Thought process:  normal  Thought content:    WNL  Sensory/Perceptual disturbances:    WNL  Orientation:  oriented to person, place, time/date, and situation  Attention:  Good  Concentration:  Good  Memory:  WNL  Fund of knowledge:   Good  Insight:    Good  Judgment:   Good  Impulse Control:  Good   Risk Assessment: Danger to Self:  No Self-injurious Behavior: No Danger to Others: No Duty to Warn:no Physical Aggression / Violence:No  Access to Firearms a concern: No  Gang Involvement:No   Subjective: Patient has minimal participation in session but was cooperative throughout the session and was responsive. Patient voices she has back pain and was experiencing discomfort.  Patient voices continued motivation for treatment and reports benefit from sessions addressing psychosocial stressors.   Interventions:  Patient Centered Therapy LCSW checked in with patient and explored current symptoms and psychosocial stressors, patient reports physical discomfort related to back pain but denies any current psychosocial stressors or concerns at this time. Engaged patient in discussing readiness to have baby, patient identified supports and items she has in place and voices that she is ready and prepared for new baby. LCSW discussed patient's goal of counseling and began discussing termination.   Diagnosis:   ICD-10-CM   1. Major depressive disorder, recurrent episode, moderate (HCC) with Anxious distress  F33.1     2. Mild intellectual disabilities  F70      Plan: Patient's goal of  treatment is to get all the help she can get and with her depression.   Treatment Target: Increase coping skills to increase emotions regulation Breathing exercises Self-care - nutrition, sleep, exercise Teach distress tolerance techniques - "what helps me" Listen to music Go to the park Go on a walk      LCSW became aware on 06/07/21 that patient may have a guardian. However, this has not been confirmed as of today and feel it is in the patient's best interest to continue to provide support and care.   Future Appointments  Date Time Provider Department Center  08/01/2021 12:30 PM Kathreen Cosier, LCSW [facility] None  08/29/2021 11:20 AM Kathreen Cosier, LCSW [facility] None  09/03/2021  8:00 AM [facility] None  09/11/2021  8:30 AM [facility] None   Kathreen Cosier, LCSW

## 2021-08-03 ENCOUNTER — Observation Stay
Admission: EM | Admit: 2021-08-03 | Discharge: 2021-08-03 | Disposition: A | Discharge status: Home / Self Care | Payer: Medicare Other | Attending: Obstetrics and Gynecology | Admitting: Obstetrics and Gynecology

## 2021-08-03 ENCOUNTER — Encounter: Payer: Self-pay | Admitting: Obstetrics and Gynecology

## 2021-08-03 ENCOUNTER — Other Ambulatory Visit: Payer: Self-pay

## 2021-08-03 DIAGNOSIS — O26893 Other specified pregnancy related conditions, third trimester: Secondary | ICD-10-CM | POA: Diagnosis present

## 2021-08-03 DIAGNOSIS — D509 Iron deficiency anemia, unspecified: Secondary | ICD-10-CM | POA: Diagnosis not present

## 2021-08-03 DIAGNOSIS — M5430 Sciatica, unspecified side: Secondary | ICD-10-CM | POA: Diagnosis not present

## 2021-08-03 DIAGNOSIS — M545 Low back pain, unspecified: Secondary | ICD-10-CM | POA: Diagnosis not present

## 2021-08-03 DIAGNOSIS — Z3A33 33 weeks gestation of pregnancy: Secondary | ICD-10-CM | POA: Diagnosis not present

## 2021-08-03 DIAGNOSIS — O26842 Uterine size-date discrepancy, second trimester: Secondary | ICD-10-CM | POA: Insufficient documentation

## 2021-08-03 LAB — URINALYSIS, COMPLETE (UACMP) WITH MICROSCOPIC
Bacteria, UA: NONE SEEN
Bilirubin Urine: NEGATIVE
Glucose, UA: NEGATIVE mg/dL
Hgb urine dipstick: NEGATIVE
Ketones, ur: NEGATIVE mg/dL
Leukocytes,Ua: NEGATIVE
Nitrite: NEGATIVE
Protein, ur: NEGATIVE mg/dL
Specific Gravity, Urine: 1.024 (ref 1.005–1.030)
pH: 6 (ref 5.0–8.0)

## 2021-08-03 MED ORDER — ACETAMINOPHEN 500 MG PO TABS
1000.0000 mg | ORAL_TABLET | Freq: Four times a day (QID) | ORAL | Status: DC | PRN
  Administered 2021-08-03: 1000 mg via ORAL
  Filled 2021-08-03: qty 2

## 2021-08-03 NOTE — Discharge Instructions (Signed)
You can use a heating pad on your back to help with pain as well use a tennis ball. Lie down on the tennis ball and adjust it to where it is at a painful spot on your buttocks or lower back, roll up and down on the ball for at least 30 seconds and move to next painful spot and repeat. Take warm showers and let the water fall on you low back, drink lots of water and stay hydrated. Continue to use the stretches Ulyses Southward gave you. Tobi Bastos will put in a consult on Monday for therapy.

## 2021-08-03 NOTE — OB Triage Note (Signed)
Patient is a 30  yo, G2P1, at weeks days. Patient presents with complaints of  a pinched nerve in her back with pain 10/10 radiating down her legs. Patient denies any vaginal bleeding or LOF. Pt reports "I feel like I may be having some contractions." Monitors applied and assessing. VSS. Initial fetal heart tone 135. Will continue to monitor.

## 2021-08-03 NOTE — Progress Notes (Signed)
Discharge instructions provided to patient. Patient verbalized understanding. Pt educated on signs and symptoms of labor, vaginal bleeding, LOF, and fetal movement. Red flag signs reviewed by RN. Patient discharged home in stable condition. 

## 2021-08-04 NOTE — Discharge Summary (Signed)
[name] is a 30 y.o. female. She is at [redacted]w[redacted]d gestation. Patient's last menstrual period was 12/13/2020. Estimated Date of Delivery: 09/19/21  Prenatal care site: [facility]  Chief complaint: lower back pain   HPI: [name] presents to L&D with complaints of lower back/hip pain that shoots trhough her buttocks and to the back of her thigh.  Reports she was seen in the office recently and diagnosed with sciatica pain.    Factors complicating pregnancy: Obesity in pregnancy  History of LTCS for breech presentation  Intellectual disability and social concerns  Trichomonas infection in 1st trimester  Maternal iron deficiency anemia in pregnancy  Depression   S: Resting comfortably. no CTX, no VB.no LOF,  Active fetal movement.   Maternal Medical History:  Past Medical Hx:  has a past medical history of Depression, Herpes labialis, and Mild mental retardation.    Past Surgical Hx:  has a past surgical history that includes Cesarean section.   No Known Allergies   Prior to Admission medications   Medication Sig Start Date End Date Taking? Authorizing Provider  ferrous sulfate 325 (65 FE) MG tablet Take 325 mg by mouth daily with breakfast.   Yes [provider]  Prenatal Vit-Fe Fumarate-FA (PRENATAL MULTIVITAMIN) TABS tablet Take 1 tablet by mouth daily at 12 noon.   Yes [provider]  fluticasone (FLONASE) 50 MCG/ACT nasal spray Place 2 sprays into both nostrils daily. 08/22/18 12/19/19  Menshew, Charlesetta Ivory, PA-C  omeprazole (PRILOSEC OTC) 20 MG tablet Take 1 tablet (20 mg total) by mouth daily. 02/10/21 02/10/21  Chesley Noon, MD    Social History: She  reports that she has never smoked. She has never used smokeless tobacco. She reports that she does not drink alcohol and does not use drugs.  Family History: Family history unknown by patient   Review of Systems: A full review of systems was performed and negative except as noted in the HPI.     O:  BP 107/63 (BP Location: Left Arm)   Pulse 85   Temp 98.3 F (36.8 C) (Oral)   Resp 14   LMP 12/13/2020  Results for orders placed or performed during the hospital encounter of 08/03/21 (from the past 48 hour(s))  Urinalysis, Complete w Microscopic Urine, Clean Catch   Collection Time: 08/03/21 10:03 PM  Result Value Ref Range   Color, Urine YELLOW (A) YELLOW   APPearance HAZY (A) CLEAR   Specific Gravity, Urine 1.024 1.005 - 1.030   pH 6.0 5.0 - 8.0   Glucose, UA NEGATIVE NEGATIVE mg/dL   Hgb urine dipstick NEGATIVE NEGATIVE   Bilirubin Urine NEGATIVE NEGATIVE   Ketones, ur NEGATIVE NEGATIVE mg/dL   Protein, ur NEGATIVE NEGATIVE mg/dL   Nitrite NEGATIVE NEGATIVE   Leukocytes,Ua NEGATIVE NEGATIVE   RBC / HPF 0-5 0 - 5 RBC/hpf   WBC, UA 0-5 0 - 5 WBC/hpf   Bacteria, UA NONE SEEN NONE SEEN   Squamous Epithelial / LPF 11-20 0 - 5   Mucus PRESENT       Constitutional: NAD, AAOx3  HE/ENT: extraocular movements grossly intact, moist mucous membranes CV: RRR PULM: nl respiratory effort, CTABL Abd: gravid, non-tender, non-distended, soft  Ext: Non-tender, Nonedmeatous Psych: mood appropriate, speech normal Pelvic : deferred  Fetal Monitor: Baseline: 145 bpm Variability: moderate Accels: Present Decels: none Toco: Irregular, mild contractions   Category: I   Assessment: 30 y.o. [redacted]w[redacted]d here for antenatal surveillance during pregnancy.  Principle diagnosis: Sciatica pain  in pregnancy    Plan: Labor: not present.  Fetal Wellbeing: Reassuring Cat 1 tracing. Reactive NST  Comfort measures given for sciatica pain  Will place referral for PT  D/c home stable, precautions reviewed, follow-up as scheduled.   ----- Margaretmary Eddy, CNM Certified Nurse Midwife [facility]

## 2021-08-17 IMAGING — CR DG SHOULDER 2+V*L*
1 series · 3 of 3 positions shown · non-contrast
Comparison: None.

CLINICAL DATA: Left shoulder pain for the past 2-3 weeks.

EXAM:
LEFT SHOULDER - 2+ VIEW

[Series 1: dg shoulder left · 0.14mm/px · 3 of 3 slices shown]
[im 1/3]
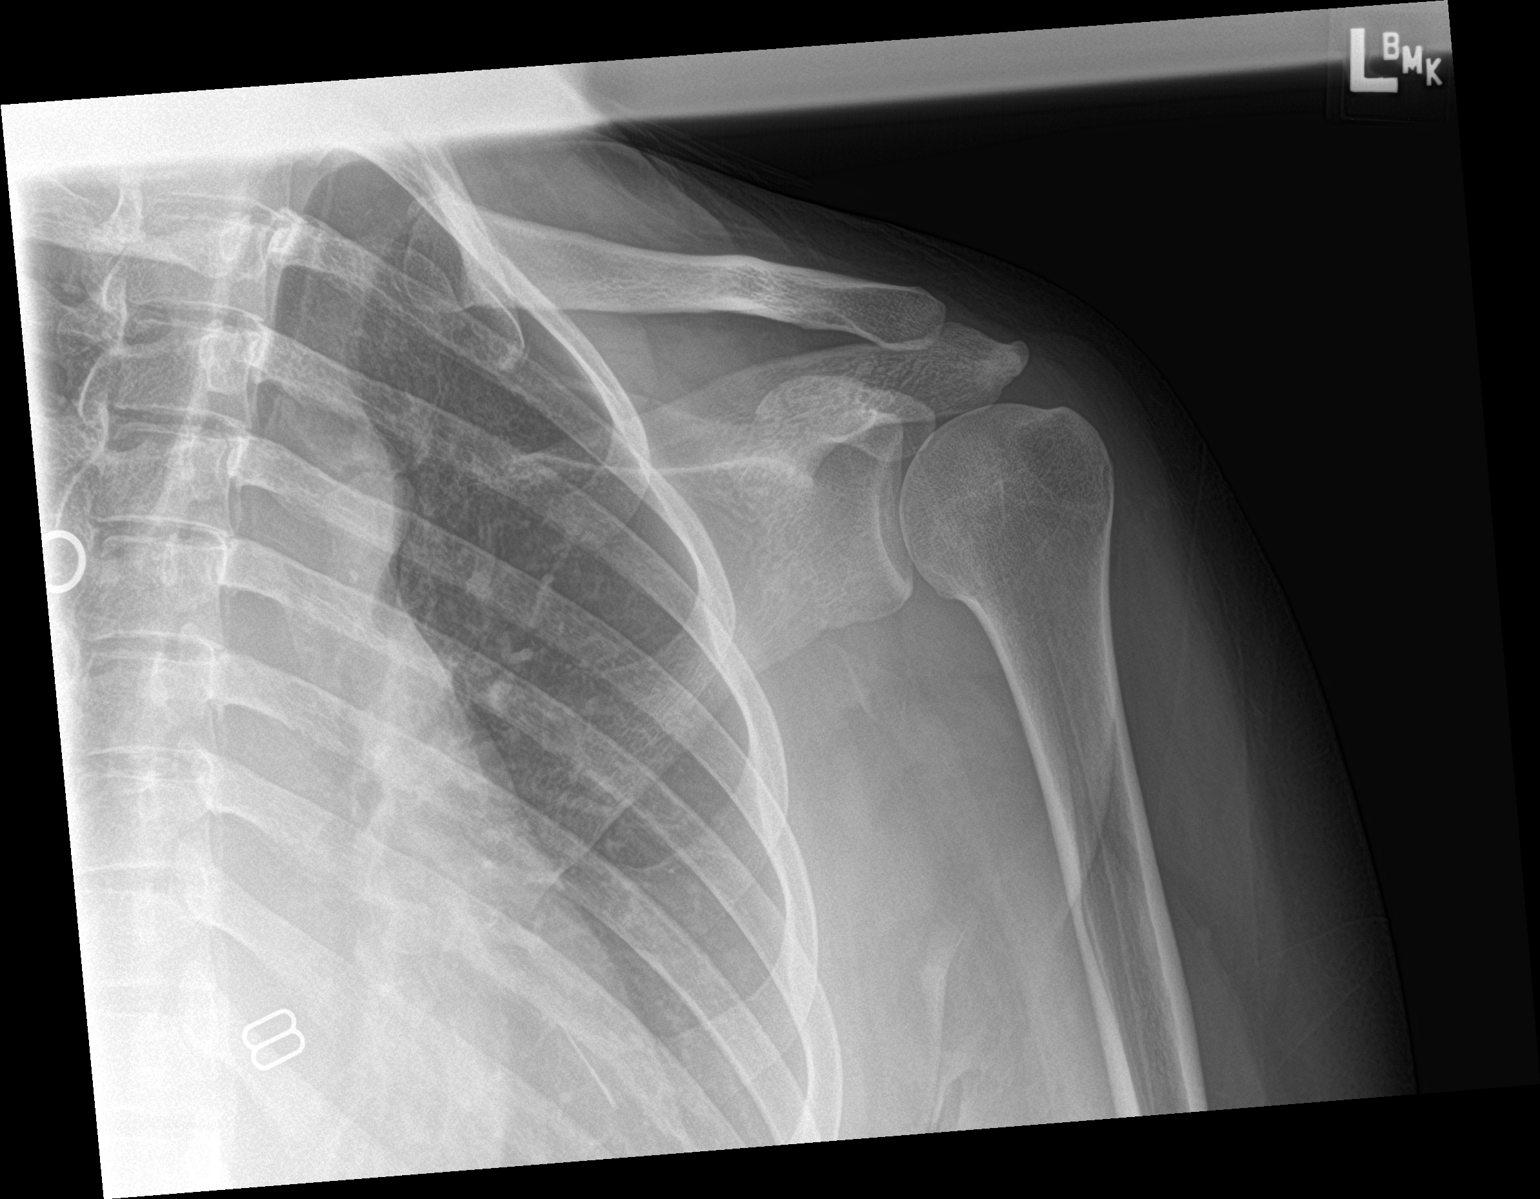
[im 2/3]
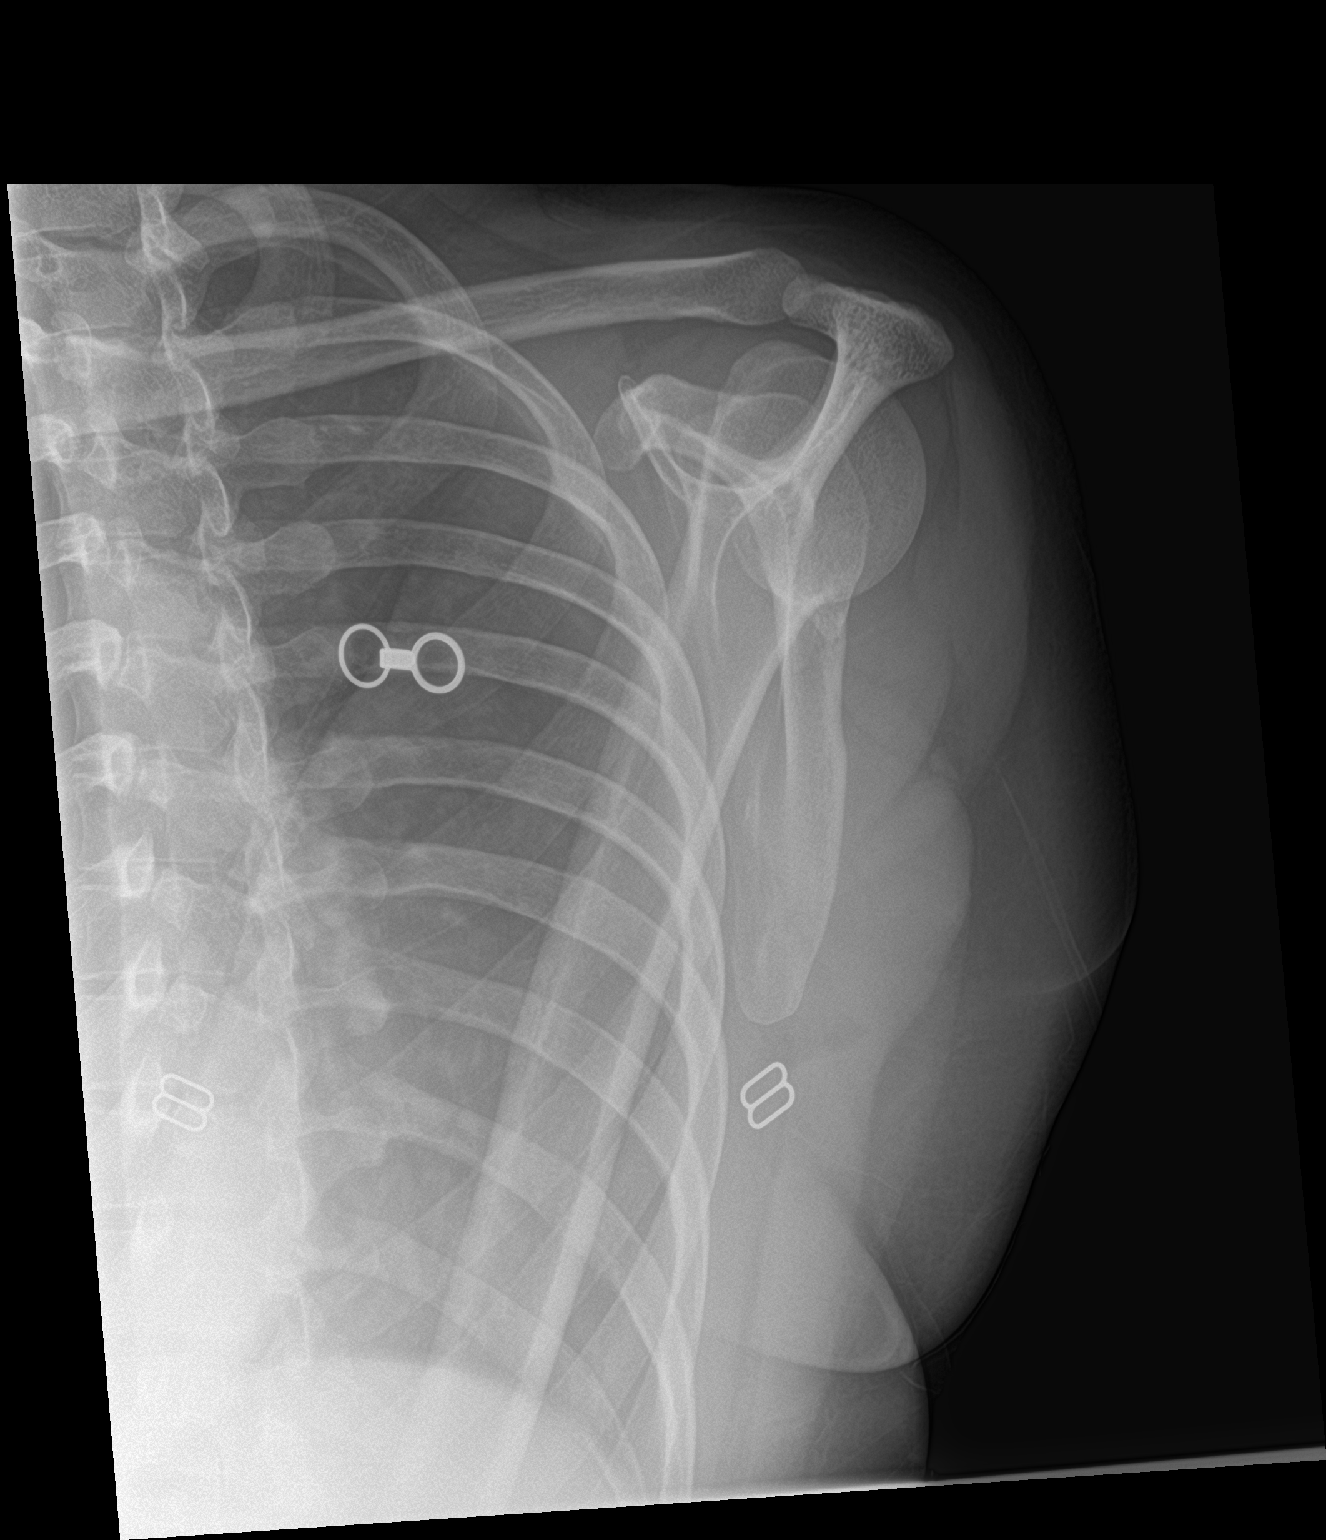
[im 3/3]
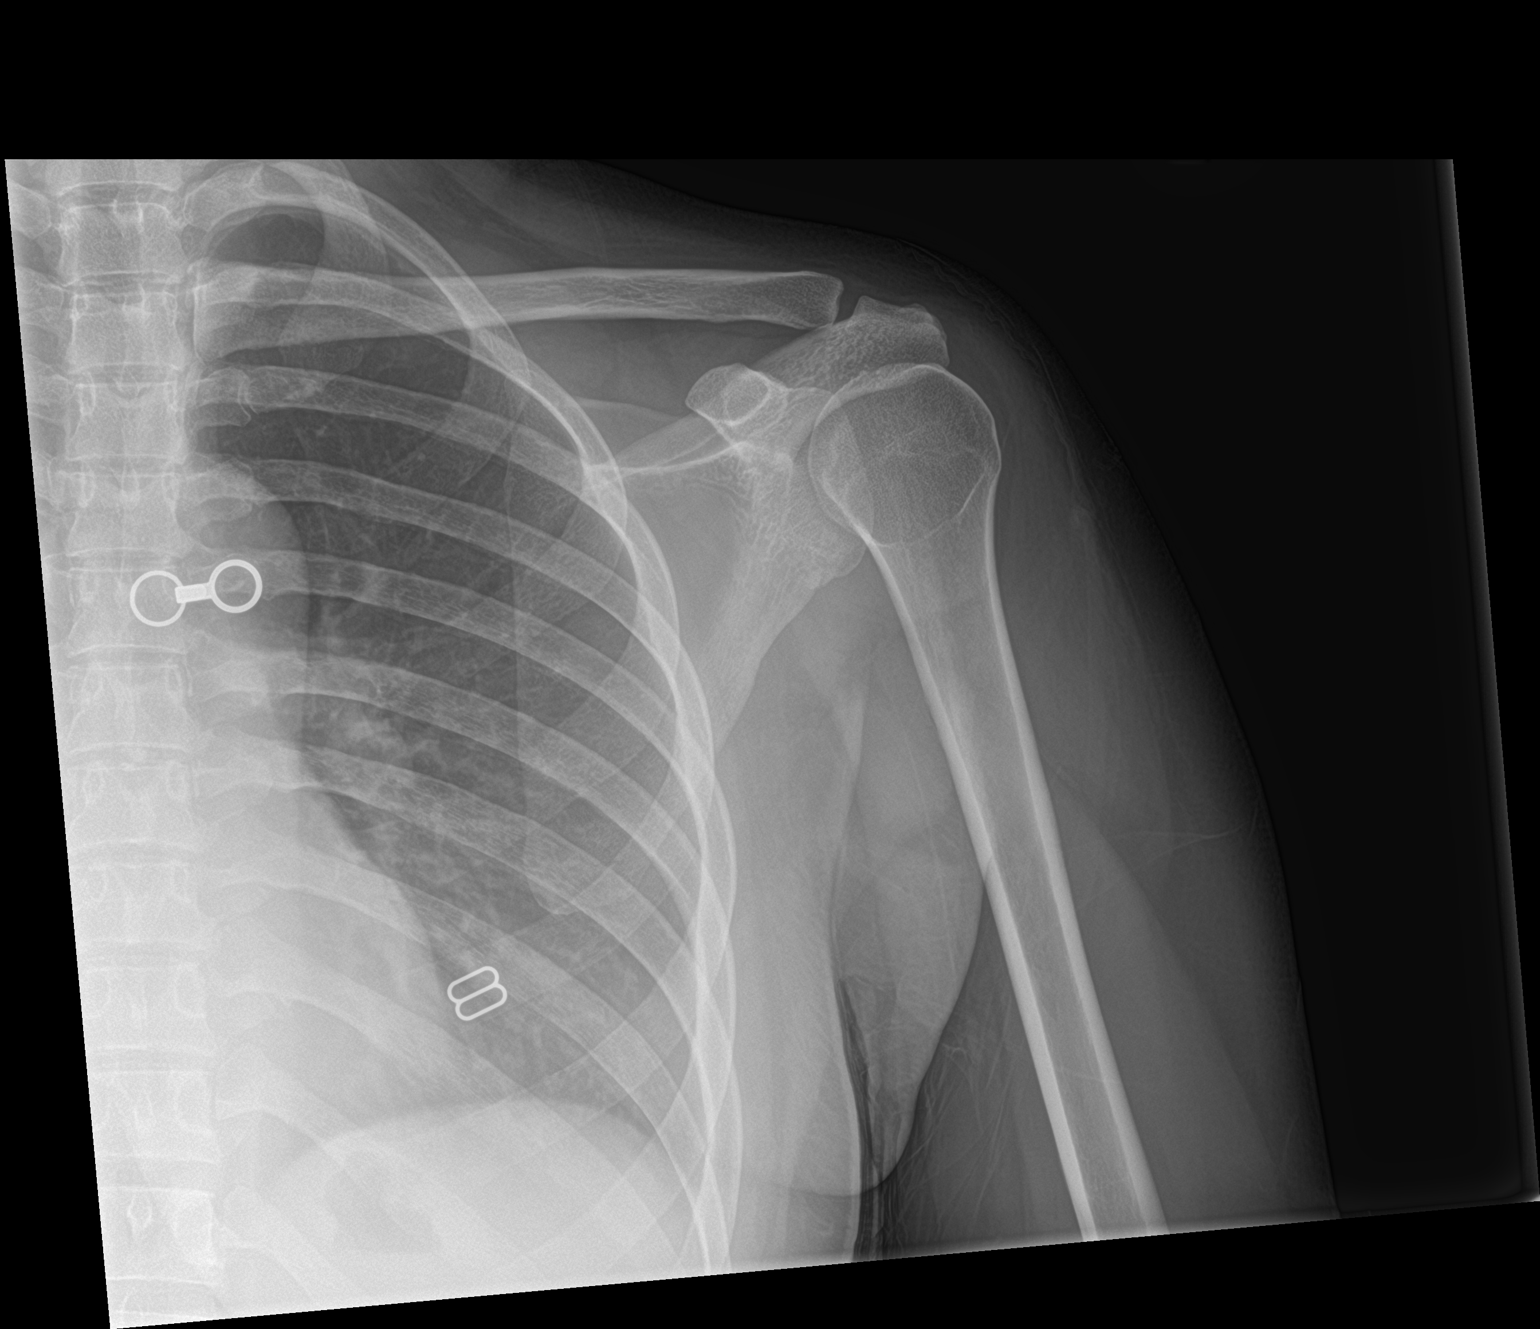

[3 of 3 positions shown; findings below may reference images not displayed]

FINDINGS: There is no evidence of fracture or dislocation. There is no
evidence of arthropathy or other focal bone abnormality. Soft
tissues are unremarkable.
IMPRESSION: Negative.

## 2021-08-17 IMAGING — US US OB < 14 WEEKS - US OB TV
1 series · 14 of 28 positions shown · non-contrast
Comparison: None.

CLINICAL DATA: Right lower quadrant pain.

EXAM:
OBSTETRIC <14 WK US AND TRANSVAGINAL OB US
TECHNIQUE: Both transabdominal and transvaginal ultrasound examinations were
performed for complete evaluation of the gestation as well as the
maternal uterus, adnexal regions, and pelvic cul-de-sac.
Transvaginal technique was performed to assess early pregnancy.

[Series 1: us ob comp less 14 wks · 14 of 45 slices shown]
[im 2/45]
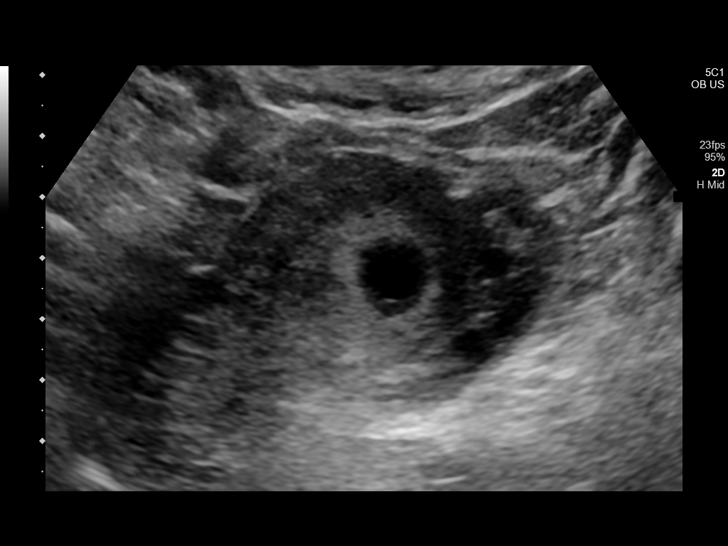
[im 5/45]
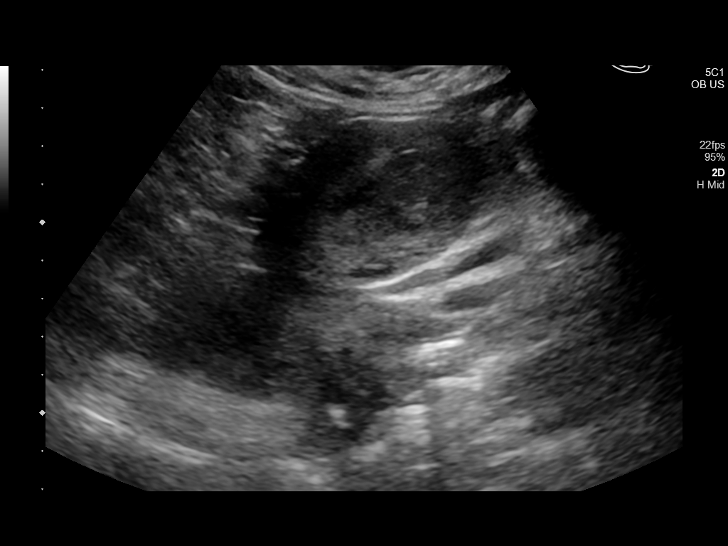
[im 9/45]
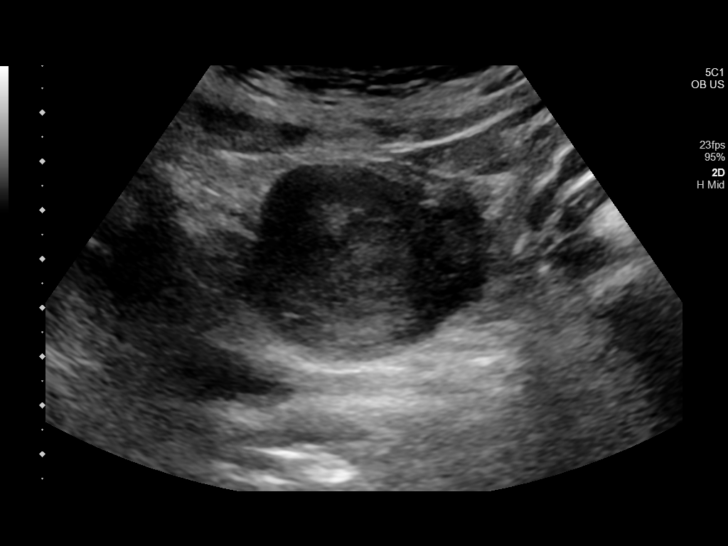
[im 12/45]
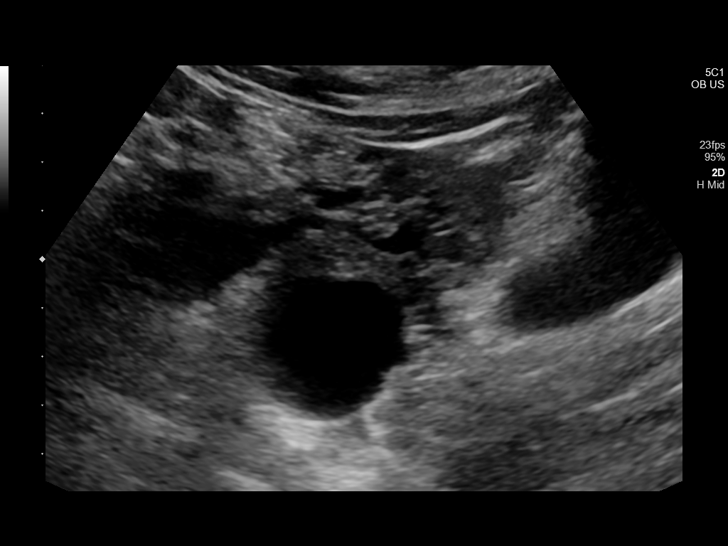
[im 15/45]
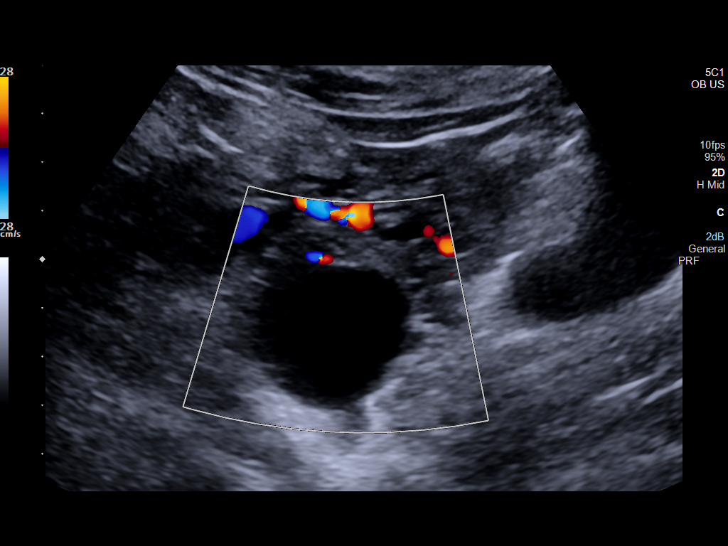
[im 18/45]
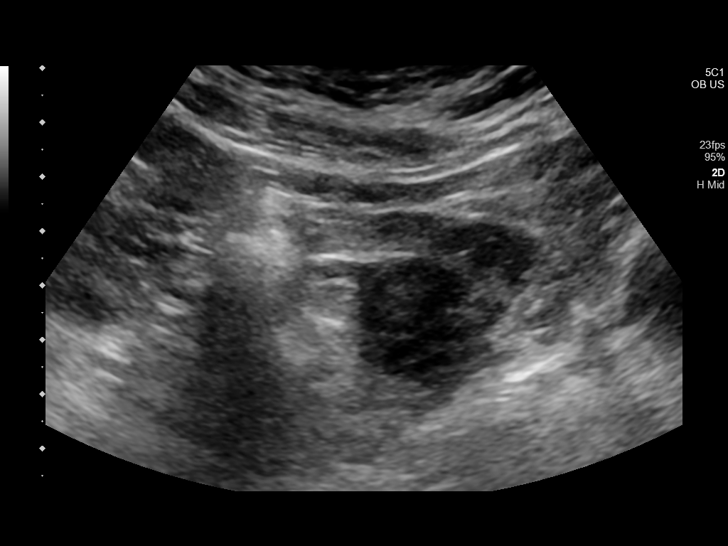
[im 22/45]
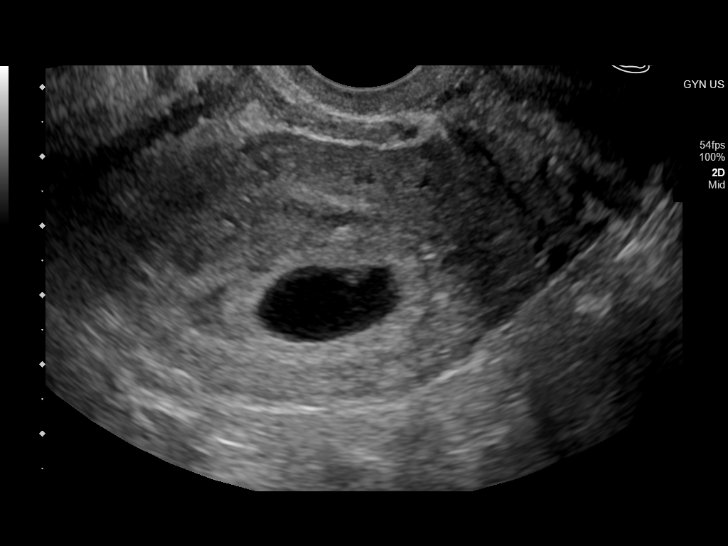
[im 25/45]
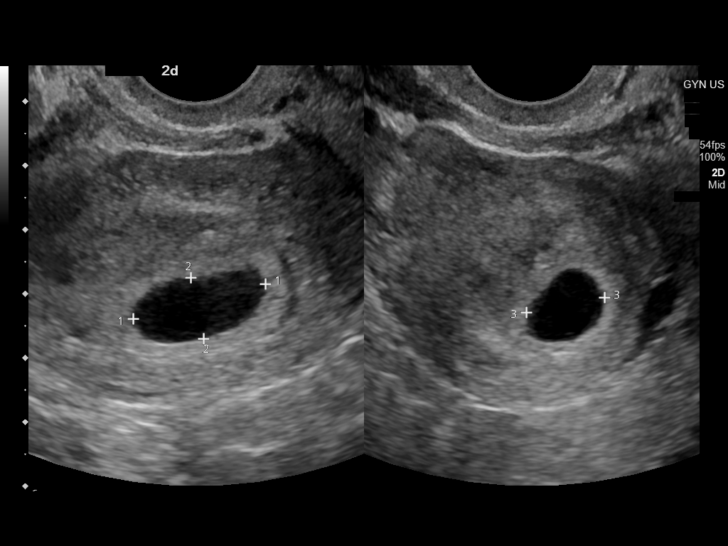
[im 28/45]
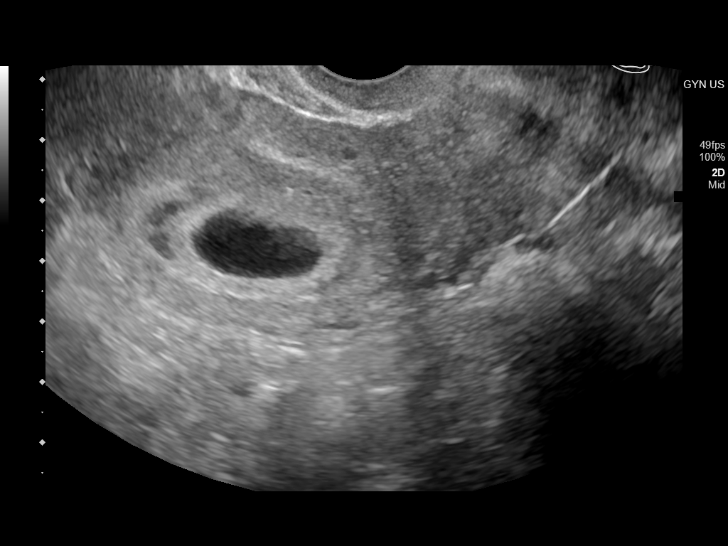
[im 31/45]
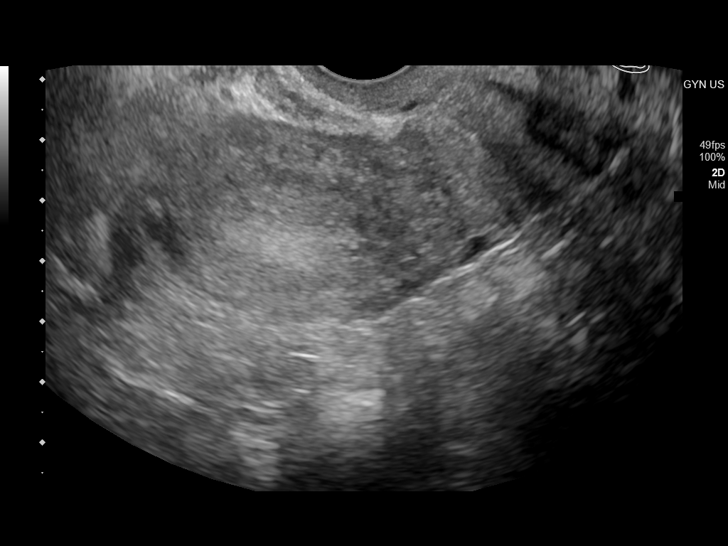
[im 35/45]
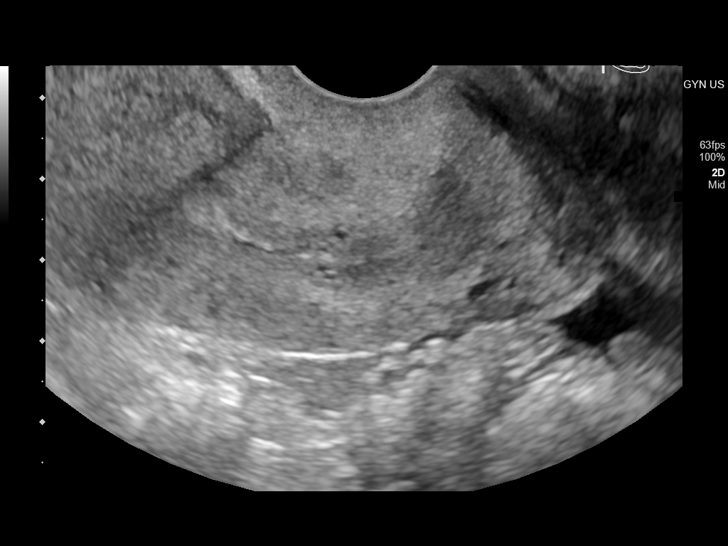
[im 38/45]
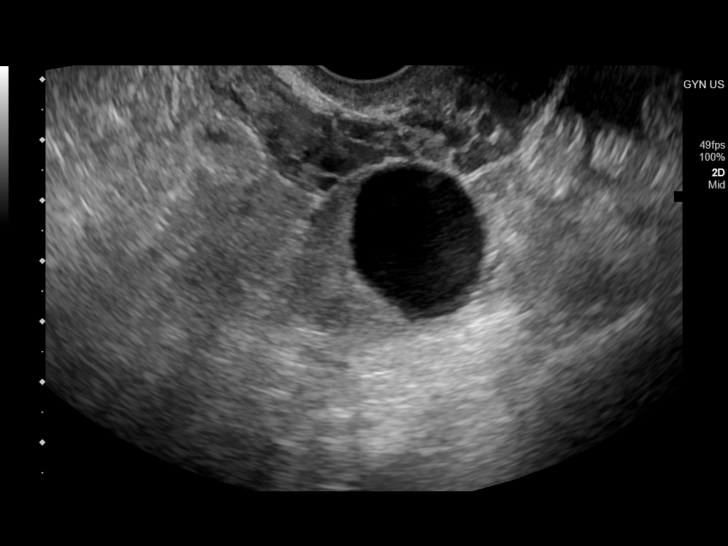
[im 41/45]
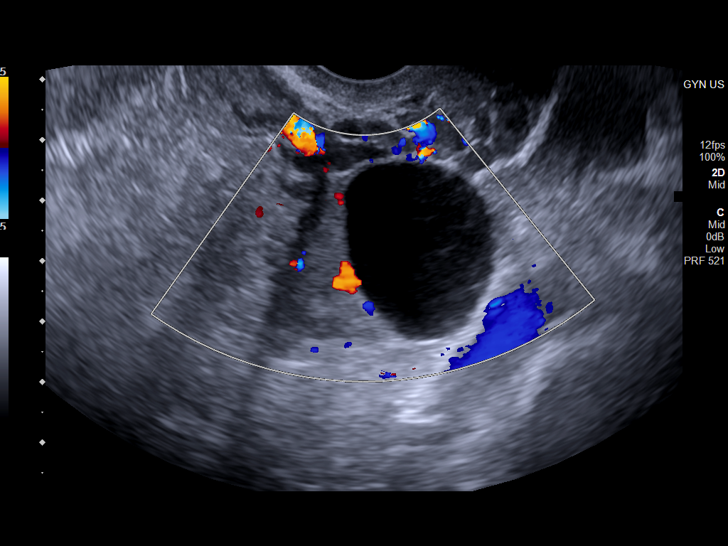
[im 45/45]
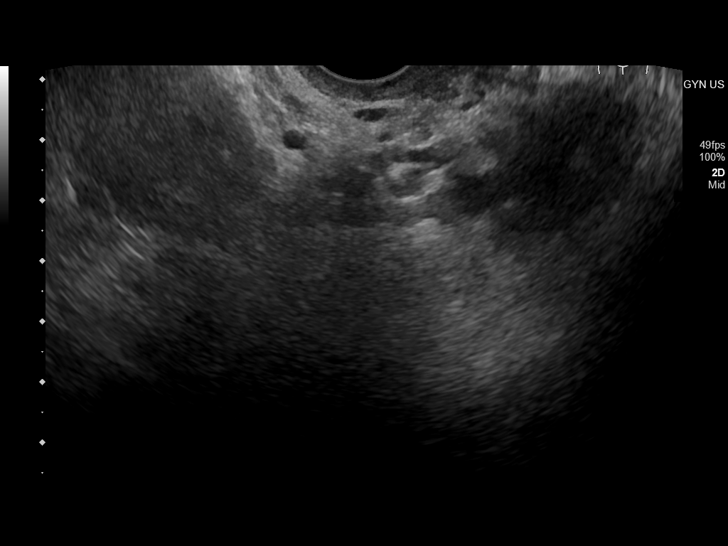

[14 of 28 positions shown; findings below may reference images not displayed]

FINDINGS: Intrauterine gestational sac: Single

Yolk sac:  Not Visualized.

Embryo:  Not Visualized.

Cardiac Activity: Not Visualized.

Heart Rate: N/A  bpm

MSD: 14.5 mm   6 w   2 d

Subchorionic hemorrhage:  Small

Maternal uterus/adnexae: A corpus luteum cyst is seen within an
otherwise normal appearing right ovary.

The left ovary is visualized and is normal in appearance.

A small amount of pelvic free fluid is seen.
IMPRESSION: Single intrauterine gestational sac and yolk sac, at 6 weeks and 2
days gestation by ultrasound evaluation, without visualization of a
fetal pole. While this may be secondary to early intrauterine
pregnancy, correlation with follow-up pelvic ultrasound is
recommended.

## 2021-08-21 NOTE — H&P (Addendum)
[name] is a 30 y.o. female presenting for .repeat LTCS [facility] 09/19/21 Problems in pregnancy : Factors complicating this pregnancy  Obesity in pregnancy, BMI 32 Labs:  Early 1hr GTT:  114 HgA1C: 5.3 TSH: 1.286 CMP:  WNL Urine PCR: 78 Anesthesia consult if BMI > 45 by 36 weeks  History of LTCS for breech Delivery preference: repeat LTCS   Delivery scheduled for:  Placenta location: anterior, no previa.  History of HSV  []  Prophylaxis at 36 weeks  Intellectual disability and social concerns  Guardianship: Pt declared incompetent by the court 03/2013 Pt cannot sign any consents legally.  Legal guardian is 04/2013- but pt has had no contact. Wende Mott is working with DSS on this.  Referral placed to [facility] for help clarifying (looking for housing and transportation needs) [ ]  Follow up with legal documentation Elonda Husky is our contact from adult protective services she is working on guardianship Cell [phone] (critical because of postpartum contraception/consent for surgery) Housing insecurity  02/23/2021 - rx for flagyl given  [x]  TOC in 4-6 weeks  Resolved on lab 03/23/21 Anemia in pregnancy 02/24/21 - hgb 10.3 at NOB Started on iron supplements  04/25/21: Hgb 10.4, 319, ferritin 65  06/25/21: hgb 10.0 07/25/21 CBC: 10.1 Ferritin: 39 Depression Medications: Previously took zoloft but stopped  NOB EPDS: 10 Gardian passed away in 11/25/2022, housing insecurities, increased stress OB History     Gravida  2   Para  1   Term  1   Preterm      AB      Living  1      SAB      IAB      Ectopic      Multiple      Live Births  1          Past Medical History:  Diagnosis Date   Depression    Herpes labialis    Mild mental retardation    Past Surgical History:  Procedure Laterality Date   CESAREAN SECTION     Family History: family history is not on file. Social History:  reports that she has never smoked. She has never used smokeless tobacco. She  reports that she does not drink alcohol and does not use drugs.     Maternal Diabetes: No Genetic Screening: Normal Maternal Ultrasounds/Referrals: Normal Fetal Ultrasounds or other Referrals:  None Maternal Substance Abuse:  No Significant Maternal Medications:  None Significant Maternal Lab Results:  Other:  Other Comments:  None  Review of Systems History   Last menstrual period 12/13/2020. Exam Physical Exam  BP 111/80   Pulse 108   Ht 162.6 cm (5' 4.02")   Wt 90.7 kg (200 lb)   LMP 12/13/2020   BMI 34.31 kg/m   WDWN female  Lungs CTA   CV RRR   ABd soft NT  Gravid  Prenatal labs: ABO, Rh: --/--/O POS Performed at [facility], [address] [place], [facility] [address]  [phone] 1930) Antibody:  neg Rubella:  imm , varicella Imm RPR:   NR HBsAg:   neg HIV:   neg GBS:   pending   Assessment/Plan: Elective repeat c/s  Need to to legal involved to have guardian be able to consent for surgery  Surgery 09/12/21    [id number] [name] 08/21/2021, 6:56 PM

## 2021-08-22 LAB — OB RESULTS CONSOLE GBS: GBS: NEGATIVE

## 2021-08-22 LAB — OB RESULTS CONSOLE GC/CHLAMYDIA
Chlamydia: NEGATIVE
Gonorrhea: NEGATIVE

## 2021-08-29 ENCOUNTER — Ambulatory Visit: Payer: Medicare Other | Admitting: Licensed Clinical Social Worker

## 2021-08-29 ENCOUNTER — Telehealth: Payer: Self-pay | Admitting: Licensed Clinical Social Worker

## 2021-08-29 NOTE — Telephone Encounter (Signed)
LCSW attempted to contact patient due to missed appt.  LCSW notified patient's care manager, Esmeralda Links RN of the missed appt.

## 2021-09-03 ENCOUNTER — Inpatient Hospital Stay: Admission: RE | Admit: 2021-09-03 | Payer: Medicare Other | Source: Ambulatory Visit

## 2021-09-04 ENCOUNTER — Encounter
Admission: RE | Admit: 2021-09-04 | Discharge: 2021-09-04 | Disposition: A | Discharge status: Home / Self Care | Payer: Medicare Other | Source: Ambulatory Visit | Attending: Obstetrics and Gynecology | Admitting: Obstetrics and Gynecology

## 2021-09-04 ENCOUNTER — Other Ambulatory Visit: Payer: Self-pay

## 2021-09-04 HISTORY — DX: Other specified postprocedural states: Z98.890

## 2021-09-04 HISTORY — DX: Other specified postprocedural states: R11.2

## 2021-09-04 NOTE — Patient Instructions (Addendum)
Arrival Time: Please call Labor and Delivery the day before your scheduled C-Section to find out your arrival time. [phone].  Arrival: If your arrival time is prior to 6:00 am, please enter through the Emergency Room Entrance and you will be directed to Labor and Delivery. If your arrival time is 6:00 am or later, please enter the Medical Mall and follow the greeter's instructions.  Labor and Delivery  Laboring women (regardless of age) may have one designated support person and one additional visitor age 33+ at a time, and a doula registered with [facility] for the labor and delivery phase of their stay. (Doulas not registered with [facility] are considered visitors.) The visitor (not the designated support person) may rotate out with other people. Children under 16 are not allowed. The designated support person or the visitor may stay overnight in the room and may alternate nights if the birthing mother has a multi-night stay.  Mother Baby Unit, OB Specialty and Gynecological Care  A designated support person and up to two additional visitors ages 12+ are allowed at one time in the room. (In this case, children ages 12+ count toward the number of allowed visitors.) Children between the ages of 79 and 7 must be accompanied by an adult who is not the patient. The two visitors (not the designated support person) may rotate out with other people throughout the patients stay. The designated support person or a visitor age 47+ may stay overnight in the room and may alternate nights if the birthing mother/patient has a multi-night stay.   Your procedure is scheduled on: Wednesday, December 28  REMEMBER: Instructions that are not followed completely may result in serious medical risk, up to and including death; or upon the discretion of your surgeon and anesthesiologist your surgery may need to be rescheduled.  Do not eat food after midnight the night before surgery.  No gum chewing,  lozengers or hard candies.  You may however, drink water up to 2 hours before you are scheduled to arrive for your surgery. Do not drink anything within 2 hours of your scheduled arrival time.  DO NOT TAKE ANY MEDICATIONS THE MORNING OF SURGERY   One week prior to surgery: STARTING December 21 Stop Anti-inflammatories (NSAIDS) such as Advil, Aleve, Ibuprofen, Motrin, Naproxen, Naprosyn and Aspirin based products such as Excedrin, Goodys Powder, BC Powder. Stop ANY OVER THE COUNTER supplements until after surgery. You may however, continue to take Tylenol if needed for pain up until the day of surgery.  No Alcohol for 24 hours before or after surgery.  No Smoking including e-cigarettes for 24 hours prior to surgery.  No chewable tobacco products for at least 6 hours prior to surgery.  No nicotine patches on the day of surgery.  Do not use any "recreational" drugs for at least a week prior to your surgery.  Please be advised that the combination of cocaine and anesthesia may have negative outcomes, up to and including death. If you test positive for cocaine, your surgery will be cancelled.  On the morning of surgery brush your teeth with toothpaste and water, you may rinse your mouth with mouthwash if you wish. Do not swallow any toothpaste or mouthwash.  Use CHG wipes as directed on instruction sheet.  Do not wear jewelry, make-up, hairpins, clips or nail polish.  Do not wear lotions, powders, or perfumes.   Do not shave body from the neck down 48 hours prior to surgery just in case you cut yourself  which could leave a site for infection.  Also, freshly shaved skin may become irritated if using the CHG soap.  Contact lenses, hearing aids and dentures may not be worn into surgery.  Do not bring valuables to the hospital. [facility] is not responsible for any missing/lost belongings or valuables.   Notify your doctor if there is any change in your medical condition (cold, fever,  infection).  Wear comfortable clothing (specific to your surgery type) to the hospital.  After surgery, you can help prevent lung complications by doing breathing exercises.  Take deep breaths and cough every 1-2 hours. Your doctor may order a device called an Incentive Spirometer to help you take deep breaths. When coughing or sneezing, hold a pillow firmly against your incision with both hands. This is called splinting. Doing this helps protect your incision. It also decreases belly discomfort.  Please call the Pre-admissions Testing Dept. at [phone] if you have any questions about these instructions.

## 2021-09-11 ENCOUNTER — Other Ambulatory Visit: Payer: Medicare Other

## 2021-09-11 ENCOUNTER — Encounter: Payer: Self-pay | Admitting: Urgent Care

## 2021-09-11 ENCOUNTER — Other Ambulatory Visit: Payer: Self-pay

## 2021-09-11 ENCOUNTER — Encounter
Admission: RE | Admit: 2021-09-11 | Discharge: 2021-09-11 | Disposition: A | Discharge status: Home / Self Care | Payer: Medicare Other | Source: Ambulatory Visit | Attending: Obstetrics and Gynecology | Admitting: Obstetrics and Gynecology

## 2021-09-11 DIAGNOSIS — Z20822 Contact with and (suspected) exposure to covid-19: Secondary | ICD-10-CM | POA: Insufficient documentation

## 2021-09-11 DIAGNOSIS — Z01812 Encounter for preprocedural laboratory examination: Secondary | ICD-10-CM | POA: Insufficient documentation

## 2021-09-11 DIAGNOSIS — Z01818 Encounter for other preprocedural examination: Secondary | ICD-10-CM

## 2021-09-11 LAB — CBC
HCT: 31.9 % — ABNORMAL LOW (ref 36.0–46.0)
Hemoglobin: 10.7 g/dL — ABNORMAL LOW (ref 12.0–15.0)
MCH: 27.3 pg (ref 26.0–34.0)
MCHC: 33.5 g/dL (ref 30.0–36.0)
MCV: 81.4 fL (ref 80.0–100.0)
Platelets: 276 10*3/uL (ref 150–400)
RBC: 3.92 MIL/uL (ref 3.87–5.11)
RDW: 13.9 % (ref 11.5–15.5)
WBC: 11.3 10*3/uL — ABNORMAL HIGH (ref 4.0–10.5)
nRBC: 0 % (ref 0.0–0.2)

## 2021-09-11 LAB — TYPE AND SCREEN
ABO/RH(D): O POS
Antibody Screen: NEGATIVE
Extend sample reason: UNDETERMINED

## 2021-09-11 LAB — BASIC METABOLIC PANEL
Anion gap: 6 (ref 5–15)
BUN: 15 mg/dL (ref 6–20)
CO2: 23 mmol/L (ref 22–32)
Calcium: 8.8 mg/dL — ABNORMAL LOW (ref 8.9–10.3)
Chloride: 107 mmol/L (ref 98–111)
Creatinine, Ser: 0.69 mg/dL (ref 0.44–1.00)
GFR, Estimated: >60 mL/min (ref >60)
Glucose, Bld: 78 mg/dL (ref 70–99)
Potassium: 3.7 mmol/L (ref 3.5–5.1)
Sodium: 136 mmol/L (ref 135–145)

## 2021-09-11 LAB — SARS CORONAVIRUS 2 (TAT 6-24 HRS): SARS Coronavirus 2: NEGATIVE

## 2021-09-12 ENCOUNTER — Encounter
Admission: RE | Disposition: A | Discharge status: Home / Self Care | Payer: Self-pay | Source: Ambulatory Visit | Attending: Obstetrics and Gynecology

## 2021-09-12 ENCOUNTER — Inpatient Hospital Stay
Admission: RE | Admit: 2021-09-12 | Discharge: 2021-09-14 | DRG: 787 | Disposition: A | Discharge status: Home / Self Care | Payer: Medicare Other | Source: Ambulatory Visit | Attending: Obstetrics and Gynecology | Admitting: Obstetrics and Gynecology

## 2021-09-12 ENCOUNTER — Inpatient Hospital Stay: Payer: Medicare Other | Admitting: Anesthesiology

## 2021-09-12 ENCOUNTER — Other Ambulatory Visit: Payer: Self-pay

## 2021-09-12 ENCOUNTER — Emergency Department
Admission: EM | Admit: 2021-09-12 | Payer: Medicare Other | Source: Home / Self Care | Admitting: Obstetrics and Gynecology

## 2021-09-12 ENCOUNTER — Encounter: Payer: Self-pay | Admitting: Obstetrics and Gynecology

## 2021-09-12 DIAGNOSIS — O9081 Anemia of the puerperium: Secondary | ICD-10-CM | POA: Diagnosis not present

## 2021-09-12 DIAGNOSIS — O99344 Other mental disorders complicating childbirth: Secondary | ICD-10-CM | POA: Diagnosis present

## 2021-09-12 DIAGNOSIS — O99214 Obesity complicating childbirth: Secondary | ICD-10-CM | POA: Diagnosis present

## 2021-09-12 DIAGNOSIS — Z20822 Contact with and (suspected) exposure to covid-19: Secondary | ICD-10-CM | POA: Diagnosis present

## 2021-09-12 DIAGNOSIS — Z3A39 39 weeks gestation of pregnancy: Secondary | ICD-10-CM

## 2021-09-12 DIAGNOSIS — F70 Mild intellectual disabilities: Secondary | ICD-10-CM | POA: Diagnosis present

## 2021-09-12 DIAGNOSIS — D62 Acute posthemorrhagic anemia: Secondary | ICD-10-CM | POA: Diagnosis not present

## 2021-09-12 DIAGNOSIS — Z9889 Other specified postprocedural states: Secondary | ICD-10-CM

## 2021-09-12 DIAGNOSIS — O34219 Maternal care for unspecified type scar from previous cesarean delivery: Secondary | ICD-10-CM | POA: Diagnosis present

## 2021-09-12 DIAGNOSIS — O34211 Maternal care for low transverse scar from previous cesarean delivery: Secondary | ICD-10-CM | POA: Diagnosis present

## 2021-09-12 DIAGNOSIS — Z01818 Encounter for other preprocedural examination: Secondary | ICD-10-CM

## 2021-09-12 LAB — CBC
HCT: 28.3 % — ABNORMAL LOW (ref 36.0–46.0)
Hemoglobin: 9.5 g/dL — ABNORMAL LOW (ref 12.0–15.0)
MCH: 27.6 pg (ref 26.0–34.0)
MCHC: 33.6 g/dL (ref 30.0–36.0)
MCV: 82.3 fL (ref 80.0–100.0)
Platelets: 243 10*3/uL (ref 150–400)
RBC: 3.44 MIL/uL — ABNORMAL LOW (ref 3.87–5.11)
RDW: 13.6 % (ref 11.5–15.5)
WBC: 9.9 10*3/uL (ref 4.0–10.5)
nRBC: 0 % (ref 0.0–0.2)

## 2021-09-12 LAB — CREATININE, SERUM
Creatinine, Ser: 0.54 mg/dL (ref 0.44–1.00)
GFR, Estimated: >60 mL/min (ref >60)

## 2021-09-12 SURGERY — Surgical Case
Anesthesia: Spinal

## 2021-09-12 MED ORDER — PHENYLEPHRINE HCL-NACL 20-0.9 MG/250ML-% IV SOLN
INTRAVENOUS | Status: DC | PRN
  Administered 2021-09-12: 50 ug/min via INTRAVENOUS

## 2021-09-12 MED ORDER — SIMETHICONE 80 MG PO CHEW
80.0000 mg | CHEWABLE_TABLET | Freq: Three times a day (TID) | ORAL | Status: DC
  Administered 2021-09-12 – 2021-09-14 (×5): 80 mg via ORAL
  Filled 2021-09-12 (×5): qty 1

## 2021-09-12 MED ORDER — LACTATED RINGERS IV BOLUS
1000.0000 mL | Freq: Once | INTRAVENOUS | Status: AC
  Administered 2021-09-12: 06:00:00 1000 mL via INTRAVENOUS

## 2021-09-12 MED ORDER — DIBUCAINE (PERIANAL) 1 % EX OINT: 1.0000 "application " | TOPICAL_OINTMENT | CUTANEOUS | Status: DC | PRN

## 2021-09-12 MED ORDER — ACETAMINOPHEN 500 MG PO TABS
1000.0000 mg | ORAL_TABLET | Freq: Four times a day (QID) | ORAL | Status: DC
  Filled 2021-09-12: qty 2

## 2021-09-12 MED ORDER — MORPHINE SULFATE (PF) 0.5 MG/ML IJ SOLN
INTRAMUSCULAR | Status: DC | PRN
  Administered 2021-09-12: .1 mg via INTRATHECAL

## 2021-09-12 MED ORDER — ONDANSETRON HCL 4 MG/2ML IJ SOLN: 4.0000 mg | Freq: Three times a day (TID) | INTRAMUSCULAR | Status: DC | PRN

## 2021-09-12 MED ORDER — MEPERIDINE HCL 25 MG/ML IJ SOLN: 6.2500 mg | INTRAMUSCULAR | Status: DC | PRN

## 2021-09-12 MED ORDER — SCOPOLAMINE 1 MG/3DAYS TD PT72
1.0000 | MEDICATED_PATCH | Freq: Once | TRANSDERMAL | Status: DC
  Administered 2021-09-12: 10:00:00 1.5 mg via TRANSDERMAL
  Filled 2021-09-12: qty 1

## 2021-09-12 MED ORDER — OXYCODONE HCL 5 MG PO TABS
5.0000 mg | ORAL_TABLET | ORAL | Status: DC | PRN
  Administered 2021-09-13: 14:00:00 5 mg via ORAL
  Administered 2021-09-13 (×2): 10 mg via ORAL
  Filled 2021-09-12: qty 1
  Filled 2021-09-12 (×2): qty 2

## 2021-09-12 MED ORDER — PHENYLEPHRINE HCL-NACL 20-0.9 MG/250ML-% IV SOLN
INTRAVENOUS | Status: AC
  Filled 2021-09-12: qty 250

## 2021-09-12 MED ORDER — LACTATED RINGERS IV SOLN: INTRAVENOUS | Status: DC

## 2021-09-12 MED ORDER — KETOROLAC TROMETHAMINE 30 MG/ML IJ SOLN: 30.0000 mg | Freq: Four times a day (QID) | INTRAMUSCULAR | Status: DC | PRN

## 2021-09-12 MED ORDER — CEFAZOLIN SODIUM-DEXTROSE 2-4 GM/100ML-% IV SOLN
2.0000 g | INTRAVENOUS | Status: AC
  Administered 2021-09-12: 08:00:00 2 g via INTRAVENOUS
  Filled 2021-09-12: qty 100

## 2021-09-12 MED ORDER — MORPHINE SULFATE (PF) 0.5 MG/ML IJ SOLN
INTRAMUSCULAR | Status: AC
  Filled 2021-09-12: qty 10

## 2021-09-12 MED ORDER — GABAPENTIN 300 MG PO CAPS
300.0000 mg | ORAL_CAPSULE | Freq: Once | ORAL | Status: AC
  Administered 2021-09-12: 08:00:00 300 mg via ORAL

## 2021-09-12 MED ORDER — METHYLERGONOVINE MALEATE 0.2 MG/ML IJ SOLN
INTRAMUSCULAR | Status: AC
  Filled 2021-09-12: qty 1

## 2021-09-12 MED ORDER — KETOROLAC TROMETHAMINE 30 MG/ML IJ SOLN
INTRAMUSCULAR | Status: DC | PRN
  Administered 2021-09-12: 30 mg via INTRAVENOUS

## 2021-09-12 MED ORDER — SODIUM CHLORIDE (PF) 0.9 % IJ SOLN
INTRAMUSCULAR | Status: AC
  Filled 2021-09-12: qty 50

## 2021-09-12 MED ORDER — DIPHENHYDRAMINE HCL 50 MG/ML IJ SOLN: 12.5000 mg | INTRAMUSCULAR | Status: DC | PRN

## 2021-09-12 MED ORDER — SOD CITRATE-CITRIC ACID 500-334 MG/5ML PO SOLN
30.0000 mL | ORAL | Status: AC
  Administered 2021-09-12: 08:00:00 30 mL via ORAL
  Filled 2021-09-12: qty 30

## 2021-09-12 MED ORDER — MORPHINE SULFATE (PF) 2 MG/ML IV SOLN: 1.0000 mg | INTRAVENOUS | Status: DC | PRN

## 2021-09-12 MED ORDER — BUPIVACAINE IN DEXTROSE 0.75-8.25 % IT SOLN
INTRATHECAL | Status: DC | PRN
  Administered 2021-09-12: 1.4 mL via INTRATHECAL

## 2021-09-12 MED ORDER — ZOLPIDEM TARTRATE 5 MG PO TABS: 5.0000 mg | ORAL_TABLET | Freq: Every evening | ORAL | Status: DC | PRN

## 2021-09-12 MED ORDER — ACETAMINOPHEN 500 MG PO TABS
ORAL_TABLET | ORAL | Status: AC
  Administered 2021-09-12: 08:00:00 1000 mg via ORAL
  Filled 2021-09-12: qty 2

## 2021-09-12 MED ORDER — KETOROLAC TROMETHAMINE 30 MG/ML IJ SOLN
INTRAMUSCULAR | Status: AC
  Filled 2021-09-12: qty 1

## 2021-09-12 MED ORDER — DIPHENHYDRAMINE HCL 25 MG PO CAPS
25.0000 mg | ORAL_CAPSULE | ORAL | Status: DC | PRN
  Administered 2021-09-12 (×2): 25 mg via ORAL
  Filled 2021-09-12 (×2): qty 1

## 2021-09-12 MED ORDER — ORAL CARE MOUTH RINSE: 15.0000 mL | Freq: Once | OROMUCOSAL | Status: AC

## 2021-09-12 MED ORDER — MENTHOL 3 MG MT LOZG
1.0000 | LOZENGE | OROMUCOSAL | Status: DC | PRN
  Filled 2021-09-12: qty 9

## 2021-09-12 MED ORDER — OXYTOCIN-SODIUM CHLORIDE 30-0.9 UT/500ML-% IV SOLN
2.5000 [IU]/h | INTRAVENOUS | Status: DC
  Administered 2021-09-12: 16:00:00 2.5 [IU]/h via INTRAVENOUS
  Filled 2021-09-12: qty 500

## 2021-09-12 MED ORDER — TETANUS-DIPHTH-ACELL PERTUSSIS 5-2.5-18.5 LF-MCG/0.5 IM SUSY
0.5000 mL | PREFILLED_SYRINGE | Freq: Once | INTRAMUSCULAR | Status: DC
  Filled 2021-09-12: qty 0.5

## 2021-09-12 MED ORDER — BUPIVACAINE HCL (PF) 0.5 % IJ SOLN
INTRAMUSCULAR | Status: AC
  Filled 2021-09-12: qty 60

## 2021-09-12 MED ORDER — KETOROLAC TROMETHAMINE 30 MG/ML IJ SOLN
30.0000 mg | Freq: Four times a day (QID) | INTRAMUSCULAR | Status: AC
  Administered 2021-09-12 – 2021-09-13 (×2): 30 mg via INTRAVENOUS
  Filled 2021-09-12 (×3): qty 1

## 2021-09-12 MED ORDER — NALOXONE HCL 4 MG/10ML IJ SOLN
1.0000 ug/kg/h | INTRAVENOUS | Status: DC | PRN
  Filled 2021-09-12: qty 5

## 2021-09-12 MED ORDER — IBUPROFEN 600 MG PO TABS
600.0000 mg | ORAL_TABLET | Freq: Four times a day (QID) | ORAL | Status: DC
  Administered 2021-09-13 – 2021-09-14 (×5): 600 mg via ORAL
  Filled 2021-09-12 (×5): qty 1

## 2021-09-12 MED ORDER — HEMOSTATIC AGENTS (NO CHARGE) OPTIME
TOPICAL | Status: DC | PRN
  Administered 2021-09-12: 1 via TOPICAL

## 2021-09-12 MED ORDER — CHLORHEXIDINE GLUCONATE 0.12 % MT SOLN
15.0000 mL | Freq: Once | OROMUCOSAL | Status: AC
  Administered 2021-09-12: 07:00:00 15 mL via OROMUCOSAL
  Filled 2021-09-12: qty 15

## 2021-09-12 MED ORDER — WITCH HAZEL-GLYCERIN EX PADS: 1.0000 "application " | MEDICATED_PAD | CUTANEOUS | Status: DC | PRN

## 2021-09-12 MED ORDER — PRENATAL MULTIVITAMIN CH
1.0000 | ORAL_TABLET | Freq: Every day | ORAL | Status: DC
  Administered 2021-09-12 – 2021-09-14 (×4): 1 via ORAL
  Filled 2021-09-12 (×4): qty 1

## 2021-09-12 MED ORDER — SIMETHICONE 80 MG PO CHEW: 80.0000 mg | CHEWABLE_TABLET | ORAL | Status: DC | PRN

## 2021-09-12 MED ORDER — FENTANYL CITRATE (PF) 100 MCG/2ML IJ SOLN
INTRAMUSCULAR | Status: DC | PRN
  Administered 2021-09-12: 15 ug via INTRATHECAL

## 2021-09-12 MED ORDER — GABAPENTIN 300 MG PO CAPS
300.0000 mg | ORAL_CAPSULE | Freq: Every day | ORAL | Status: DC
  Administered 2021-09-12 – 2021-09-13 (×2): 300 mg via ORAL
  Filled 2021-09-12 (×2): qty 1

## 2021-09-12 MED ORDER — BUPIVACAINE HCL (PF) 0.5 % IJ SOLN
INTRAMUSCULAR | Status: AC
  Filled 2021-09-12: qty 10

## 2021-09-12 MED ORDER — OXYTOCIN-SODIUM CHLORIDE 30-0.9 UT/500ML-% IV SOLN
INTRAVENOUS | Status: DC | PRN
  Administered 2021-09-12: 30 [IU] via INTRAVENOUS

## 2021-09-12 MED ORDER — FENTANYL CITRATE (PF) 100 MCG/2ML IJ SOLN
INTRAMUSCULAR | Status: AC
  Filled 2021-09-12: qty 2

## 2021-09-12 MED ORDER — BUPIVACAINE LIPOSOME 1.3 % IJ SUSP
INTRAMUSCULAR | Status: AC
  Filled 2021-09-12: qty 20

## 2021-09-12 MED ORDER — ACETAMINOPHEN 500 MG PO TABS
1000.0000 mg | ORAL_TABLET | Freq: Four times a day (QID) | ORAL | Status: DC
  Administered 2021-09-12: 16:00:00 1000 mg via ORAL

## 2021-09-12 MED ORDER — NALOXONE HCL 0.4 MG/ML IJ SOLN: 0.4000 mg | INTRAMUSCULAR | Status: DC | PRN

## 2021-09-12 MED ORDER — TRANEXAMIC ACID-NACL 1000-0.7 MG/100ML-% IV SOLN
INTRAVENOUS | Status: AC
  Filled 2021-09-12: qty 100

## 2021-09-12 MED ORDER — COCONUT OIL OIL: 1.0000 "application " | TOPICAL_OIL | Status: DC | PRN

## 2021-09-12 MED ORDER — ACETAMINOPHEN 500 MG PO TABS
1000.0000 mg | ORAL_TABLET | Freq: Four times a day (QID) | ORAL | Status: DC | PRN
  Administered 2021-09-13 – 2021-09-14 (×4): 1000 mg via ORAL
  Filled 2021-09-12 (×4): qty 2

## 2021-09-12 MED ORDER — ONDANSETRON HCL 4 MG/2ML IJ SOLN
INTRAMUSCULAR | Status: DC | PRN
  Administered 2021-09-12: 4 mg via INTRAVENOUS

## 2021-09-12 MED ORDER — DIPHENHYDRAMINE HCL 25 MG PO CAPS: 25.0000 mg | ORAL_CAPSULE | Freq: Four times a day (QID) | ORAL | Status: DC | PRN

## 2021-09-12 MED ORDER — SENNOSIDES-DOCUSATE SODIUM 8.6-50 MG PO TABS
2.0000 | ORAL_TABLET | Freq: Every day | ORAL | Status: DC
  Administered 2021-09-13 – 2021-09-14 (×2): 2 via ORAL
  Filled 2021-09-12 (×2): qty 2

## 2021-09-12 MED ORDER — ENOXAPARIN SODIUM 60 MG/0.6ML IJ SOSY
0.5000 mg/kg | PREFILLED_SYRINGE | INTRAMUSCULAR | Status: DC
  Administered 2021-09-13 – 2021-09-14 (×2): 47.5 mg via SUBCUTANEOUS
  Filled 2021-09-12 (×2): qty 0.47

## 2021-09-12 MED ORDER — SODIUM CHLORIDE 0.9% FLUSH: 3.0000 mL | INTRAVENOUS | Status: DC | PRN

## 2021-09-12 MED ORDER — BUPIVACAINE HCL (PF) 0.5 % IJ SOLN
INTRAMUSCULAR | Status: DC | PRN
  Administered 2021-09-12: 60 mL

## 2021-09-12 MED ORDER — APREPITANT 40 MG PO CAPS
40.0000 mg | ORAL_CAPSULE | Freq: Once | ORAL | Status: AC
  Administered 2021-09-12: 08:00:00 40 mg via ORAL
  Filled 2021-09-12 (×2): qty 1

## 2021-09-12 MED ORDER — OXYTOCIN-SODIUM CHLORIDE 30-0.9 UT/500ML-% IV SOLN
INTRAVENOUS | Status: AC
  Filled 2021-09-12: qty 1000

## 2021-09-12 MED ORDER — ONDANSETRON HCL 4 MG/2ML IJ SOLN
INTRAMUSCULAR | Status: AC
  Filled 2021-09-12: qty 2

## 2021-09-12 MED ORDER — KETOROLAC TROMETHAMINE 30 MG/ML IJ SOLN
30.0000 mg | Freq: Four times a day (QID) | INTRAMUSCULAR | Status: DC | PRN
  Administered 2021-09-12: 16:00:00 30 mg via INTRAVENOUS

## 2021-09-12 SURGICAL SUPPLY — 34 items
APL PRP STRL LF DISP 70% ISPRP (MISCELLANEOUS) ×1
BARRIER ADHS 3X4 INTERCEED (GAUZE/BANDAGES/DRESSINGS) ×3 IMPLANT
BRR ADH 4X3 ABS CNTRL BYND (GAUZE/BANDAGES/DRESSINGS) ×1
CHLORAPREP W/TINT 26 (MISCELLANEOUS) ×3 IMPLANT
DRSG OPSITE POSTOP 4X10 (GAUZE/BANDAGES/DRESSINGS) ×2 IMPLANT
DRSG TELFA 3X8 NADH (GAUZE/BANDAGES/DRESSINGS) ×3 IMPLANT
ELECT CAUTERY BLADE 6.4 (BLADE) ×3 IMPLANT
ELECT REM PT RETURN 9FT ADLT (ELECTROSURGICAL) ×3
ELECTRODE REM PT RTRN 9FT ADLT (ELECTROSURGICAL) ×1 IMPLANT
GAUZE SPONGE 4X4 12PLY STRL (GAUZE/BANDAGES/DRESSINGS) ×3 IMPLANT
GAUZE SPONGE 4X4 12PLY STRL LF (GAUZE/BANDAGES/DRESSINGS) ×2 IMPLANT
GLOVE SURG SYN 8.0 (GLOVE) ×3 IMPLANT
GLOVE SURG SYN 8.0 PF PI (GLOVE) ×1 IMPLANT
GOWN STRL REUS W/ TWL LRG LVL3 (GOWN DISPOSABLE) ×2 IMPLANT
GOWN STRL REUS W/ TWL XL LVL3 (GOWN DISPOSABLE) ×1 IMPLANT
GOWN STRL REUS W/TWL LRG LVL3 (GOWN DISPOSABLE) ×6
GOWN STRL REUS W/TWL XL LVL3 (GOWN DISPOSABLE) ×3
MANIFOLD NEPTUNE II (INSTRUMENTS) ×3 IMPLANT
MAT PREVALON FULL STRYKER (MISCELLANEOUS) ×3 IMPLANT
NEEDLE HYPO 22GX1.5 SAFETY (NEEDLE) ×3 IMPLANT
NS IRRIG 1000ML POUR BTL (IV SOLUTION) ×3 IMPLANT
PACK C SECTION AR (MISCELLANEOUS) ×3 IMPLANT
PAD DRESSING TELFA 3X8 NADH (GAUZE/BANDAGES/DRESSINGS) ×1 IMPLANT
PAD OB MATERNITY 4.3X12.25 (PERSONAL CARE ITEMS) ×3 IMPLANT
PAD PREP 24X41 OB/GYN DISP (PERSONAL CARE ITEMS) ×3 IMPLANT
SCRUB EXIDINE 4% CHG 4OZ (MISCELLANEOUS) ×3 IMPLANT
STRAP SAFETY 5IN WIDE (MISCELLANEOUS) ×3 IMPLANT
SUCT VACUUM KIWI BELL (SUCTIONS) ×2 IMPLANT
SUT CHROMIC 1 CTX 36 (SUTURE) ×9 IMPLANT
SUT PLAIN GUT 0 (SUTURE) ×6 IMPLANT
SUT VIC AB 0 CT1 36 (SUTURE) ×6 IMPLANT
SYR 30ML LL (SYRINGE) ×6 IMPLANT
TAPE PAPER 2X10 WHT MICROPORE (GAUZE/BANDAGES/DRESSINGS) ×2 IMPLANT
WATER STERILE IRR 500ML POUR (IV SOLUTION) ×3 IMPLANT

## 2021-09-12 NOTE — Anesthesia Preprocedure Evaluation (Addendum)
Anesthesia Evaluation  Patient identified by MRN, date of birth, ID band Patient awake    Reviewed: Allergy & Precautions, NPO status , Patient's Chart, lab work & pertinent test results  History of Anesthesia Complications (+) PONV and history of anesthetic complications  Airway Mallampati: III   Neck ROM: Full    Dental no notable dental hx.    Pulmonary neg pulmonary ROS,    Pulmonary exam normal breath sounds clear to auscultation       Cardiovascular Exercise Tolerance: Good Normal cardiovascular exam Rhythm:Regular Rate:Normal  ECG 02/10/21: normal   Neuro/Psych PSYCHIATRIC DISORDERS Depression Intellectual disability    GI/Hepatic GERD (with pregnancy)  ,  Endo/Other  Obesity   Renal/GU negative Renal ROS     Musculoskeletal   Abdominal   Peds  Hematology negative hematology ROS (+)   Anesthesia Other Findings   Reproductive/Obstetrics                            Anesthesia Physical Anesthesia Plan  ASA: 2  Anesthesia Plan: Spinal   Post-op Pain Management:    Induction: Intravenous  PONV Risk Score and Plan: 3 and Treatment may vary due to age or medical condition and Ondansetron  Airway Management Planned: Natural Airway  Additional Equipment:   Intra-op Plan:   Post-operative Plan:   Informed Consent: I have reviewed the patients History and Physical, chart, labs and discussed the procedure including the risks, benefits and alternatives for the proposed anesthesia with the patient or authorized representative who has indicated his/her understanding and acceptance.       Plan Discussed with: CRNA  Anesthesia Plan Comments: (Plan for spinal, LMA/GETA backup.  Patient consented for risks of anesthesia including but not limited to:  - adverse reactions to medications - damage to eyes, teeth, lips or other oral mucosa - nerve damage due to positioning  - sore  throat or hoarseness - headache, bleeding, infection, nerve damage 2/2 spinal - damage to heart, brain, nerves, lungs, other parts of body or loss of life  Informed patient about role of CRNA in peri- and intra-operative care.  Patient voiced understanding.)        Anesthesia Quick Evaluation

## 2021-09-12 NOTE — Anesthesia Postprocedure Evaluation (Signed)
Anesthesia Post Note  Patient: [name]  Procedure(s) Performed: REPEAT CESAREAN SECTION  Patient location during evaluation: L&D Anesthesia Type: Spinal Level of consciousness: awake and alert, oriented and patient cooperative Pain management: pain level controlled Vital Signs Assessment: post-procedure vital signs reviewed and stable Respiratory status: spontaneous breathing, nonlabored ventilation and respiratory function stable Cardiovascular status: stable Postop Assessment: no headache, spinal receding, no backache and no apparent nausea or vomiting Anesthetic complications: no   No notable events documented.   Last Vitals:  Vitals:   09/12/21 1100 09/12/21 1214  BP: 118/90 106/76  Pulse: 67 67  Resp: 13   Temp: 36.4 C 36.9 C  SpO2: 100% 99%    Last Pain:  Vitals:   09/12/21 1214  TempSrc: Oral  PainSc:                  Reed Breech

## 2021-09-12 NOTE — Brief Op Note (Signed)
09/12/2021  9:00 AM  PATIENT:  [name]  30 y.o. 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  PROCEDURE:  Procedure(s): REPEAT CESAREAN SECTION (N/A)  SURGEON:  Surgeon(s) and Role:    * Kierstyn Baranowski, Ihor Austin, MD - Primary  PHYSICIAN ASSISTANT: Chari Manning ,CNM  ASSISTANTS: none   ANESTHESIA:   spinal  EBL:  qbl; 405 cc IOF 600 cc, UO 175 BLOOD ADMINISTERED:none  DRAINS: Urinary Catheter (Foley)   LOCAL MEDICATIONS USED:  MARCAINE     SPECIMEN:  No Specimen  DISPOSITION OF SPECIMEN:  N/A  COUNTS:  YES  TOURNIQUET:  * No tourniquets in log *  DICTATION: .Other Dictation: Dictation Number verbal  PLAN OF CARE: Admit to inpatient   PATIENT DISPOSITION:  PACU - hemodynamically stable.   Delay start of Pharmacological VTE agent (>24hrs) due to surgical blood loss or risk of bleeding: not applicable

## 2021-09-12 NOTE — Progress Notes (Signed)
Patient ID: [name], female   DOB: June 12, 1991, 30 y.o.   MRN: [id number] Ready for repeat LTCS. NPO . LAbs reviewed . All, questions answered

## 2021-09-12 NOTE — Discharge Summary (Signed)
Obstetrical Discharge Summary  Patient Name: [name] DOB: 1990/09/23 MRN: [id number]  Date of Admission: 09/12/2021 Date of Delivery: 09/12/21 Delivered by: Beverly Gust MD Date of Discharge: 09/14/2021  Primary OB: [name]   [facility] Estimated Date of Delivery: 09/19/21 Gestational Age at Delivery: [redacted]w[redacted]d   Antepartum complications:  Obesity in pregnancy, BMI 32 Labs:  Early 1hr GTT:             114     HgA1C: 5.3 TSH: 1.286 CMP:   WNL    Urine PCR: 78 Anesthesia consult if BMI > 45 by 36 weeks  History of LTCS for breech Delivery preference: repeat LTCS   Delivery scheduled for:  Placenta location: anterior, no previa.  History of HSV  []  Prophylaxis at 36 weeks  Intellectual disability and social concerns  Guardianship: Pt declared incompetent by the court 03/2013 Pt cannot sign any consents legally.  Legal guardian is 04/2013- but pt has had no contact. Wende Mott is working with DSS on this.  Referral placed to [facility] for help clarifying (looking for housing and transportation needs) [ ]  Follow up with legal documentation Elonda Husky is our contact from adult protective services she is working on guardianship Cell [phone] (critical because of postpartum contraception/consent for surgery) Housing insecurity  02/23/2021 - rx for flagyl given  [x]  TOC in 4-6 weeks  Resolved on lab 03/23/21 Anemia in pregnancy 02/24/21 - hgb 10.3 at [facility]B Started on iron supplements  04/25/21: Hgb 10.4, 319, ferritin 65  06/25/21: hgb 10.0 07/25/21 CBC: 10.1 Ferritin: 39 Depression Medications: Previously took zoloft but stopped  NOB EPDS: 10 Gardian passed away in 12/22/22, housing insecurities, increased stress OB History     Admitting Diagnosis:  Secondary Diagnosis: Patient Active Problem List   Diagnosis Date Noted   Previous cesarean delivery affecting pregnancy 09/12/2021   Postoperative state 09/12/2021   Sciatic pain 08/03/2021   Uterine size date  discrepancy, second trimester 08/03/2021   Abdominal cramping affecting pregnancy 06/12/2021   Encounter for supervision of other normal pregnancy, third trimester 02/15/2021   Hyperemesis affecting pregnancy, antepartum 02/13/2021   Intermittent palpitations 06/25/2017   Family history of diabetes mellitus 07/04/2016   Sepsis (HCC) 11/27/2015   Pyelonephritis 11/27/2015   AKI (acute kidney injury) (HCC) 11/27/2015   Herpes simplex virus (HSV) infection 03/31/2014   Major depressive disorder, 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 Asymmetric utx , possible right rudimentary horn . Nuchal cord x 3 loose Date of Delivery: 09/14/2021  Delivered By: 07/06/2010 MD Delivery Type: repeat cesarean section, low transverse incision Anesthesia: spinal Placenta: manual Laceration:  Episiotomy: none Newborn Data: This patient has no babies on file. Female , Apgars 8/9  wt = 3000gm  Postpartum Procedures: None  Edinburgh:  Edinburgh Postnatal Depression Scale Screening Tool 09/13/2021  I have been able to laugh and see the funny side of things. 0  I have looked forward with enjoyment to things. 0  I have blamed myself unnecessarily when things went wrong. 0  I have been anxious or worried for no good reason. 2  I have felt scared or panicky for no good reason. 0  Things have been getting on top of me. 0  I have been so unhappy that I have had difficulty sleeping. 0  I have felt sad or miserable. 0  I have been so unhappy that I have been crying. 0  The thought of harming myself has occurred to me. 0  Edinburgh Postnatal Depression Scale Total 2  Some encounter information is confidential and restricted. Go to Review Flowsheets activity to see all data.    Post partum course:  Patient had an uncomplicated postpartum course.  By time of discharge on  POD#3, her pain was controlled on oral pain medications; she had appropriate lochia and was ambulating, voiding without difficulty, tolerating regular diet and passing flatus.  TOC team consulted prior to discharge to assess for needs.  CPS is involved in her care and will follow up outpatient.  No barriers to discharge were noted by TOC or CPS.  Her sister is her legal guardian and was involved in both the admission and discharge process. She was deemed stable for discharge to home with her sister.     Discharge Physical Exam:  BP 122/85 (BP Location: Left Arm)    Pulse 74    Temp 97.8 F (36.6 C) (Oral)    Resp 18    Ht 5\' 4"  (1.626 m)    Wt 94.8 kg    LMP 12/13/2020    SpO2 100%    Breastfeeding Unknown    BMI 35.87 kg/m   General: NAD CV: RRR Pulm: CTABL, nl effort ABD: s/nd/nt, fundus firm and below the umbilicus Lochia: moderate Incision: c/d/I, covered with occlusive OP Site honey comb dressing  DVT Evaluation: LE non-ttp, no evidence of DVT on exam.  Hemoglobin  Date Value Ref Range Status  09/13/2021 8.9 (L) 12.0 - 15.0 g/dL Final   HGB  Date Value Ref Range Status  03/20/2014 11.6 (L) 12.0 - 16.0 g/dL Final   HCT  Date Value Ref Range Status  09/13/2021 27.3 (L) 36.0 - 46.0 % Final  03/20/2014 34.7 (L) 35.0 - 47.0 % Final     Disposition: stable, discharge to home. Baby Feeding: formula Baby Disposition: home with mom  Rh Immune globulin given: Rh pos  Rubella vaccine given: Immune   Tdap vaccine given in AP or PP setting: given 10/26/222 Flu vaccine given in AP or PP setting: given 07/11/2021  Contraception: TBD  Prenatal Labs:   ABO, Rh: --/--/O POS Performed at [facility], [address] [place], [place]  [phone] 1930) Antibody:  neg Rubella:  imm , varicella Imm RPR:   NR HBsAg:   neg HIV:   neg GBS:   neg  Plan:  JASPREET BODNER was discharged to home in good condition. Follow-up appointment with delivering provider in 2  weeks.  Discharge Medications: Allergies as of 09/14/2021   No Known Allergies      Medication List     TAKE these medications    acetaminophen 500 MG tablet Commonly known as: TYLENOL Take 2 tablets (1,000 mg total) by mouth every 6 (six) hours as needed for mild pain (surgery).   ferrous sulfate 325 (65 FE) MG tablet Take 325 mg by mouth daily with breakfast.   ibuprofen 600 MG tablet Commonly known as: ADVIL Take 1 tablet (600 mg total) by mouth every 6 (six) hours as needed for mild pain or cramping.   oxyCODONE 5 MG immediate release tablet Commonly known as: Oxy IR/ROXICODONE Take 1 tablet (5 mg total) by mouth every 4 (four) hours as needed for up to 7 days for moderate pain.   prenatal multivitamin Tabs tablet Take 1 tablet by mouth daily at 12 noon.         Follow-up Information  Schermerhorn, Ihor Austin, MD. Schedule an appointment as soon as possible for a visit in 2 week(s).   Specialty: Obstetrics and Gynecology Why: post-op incision check Contact information: [address] [place] [address] [phone]                 Signed:  Margaretmary Eddy, CNM Certified Nurse Midwife [facility]

## 2021-09-12 NOTE — Transfer of Care (Signed)
Immediate Anesthesia Transfer of Care Note  Patient: [name]  Procedure(s) Performed: REPEAT CESAREAN SECTION  Patient Location: PACU and Mother/Baby  Anesthesia Type:Spinal  Level of Consciousness: awake and patient cooperative  Airway & Oxygen Therapy: Patient Spontanous Breathing  Post-op Assessment: Report given to RN and Post -op Vital signs reviewed and stable  Post vital signs: Reviewed  Last Vitals:  Vitals Value Taken Time  BP 114/71 09/12/2021 0851  Temp    Pulse 72   Resp 12   SpO2 97     Last Pain:  Vitals:   09/12/21 0626  TempSrc: Oral         Complications: No notable events documented.

## 2021-09-12 NOTE — Anesthesia Procedure Notes (Signed)
Spinal  Patient location during procedure: OR Start time: 09/12/2021 7:40 AM End time: 09/12/2021 7:45 AM Reason for block: surgical anesthesia Staffing Performed: anesthesiologist and resident/CRNA  Anesthesiologist: Darrin Nipper, MD Resident/CRNA: Jerrye Noble, CRNA Preanesthetic Checklist Completed: patient identified, IV checked, site marked, risks and benefits discussed, surgical consent, monitors and equipment checked, pre-op evaluation and timeout performed Spinal Block Patient position: sitting Prep: ChloraPrep Patient monitoring: heart rate, continuous pulse ox, blood pressure and cardiac monitor Approach: midline Location: L3-4 Injection technique: single-shot Needle Needle type: Introducer and Pencan  Needle gauge: 24 G Needle length: 9 cm Assessment Sensory level: T4 Events: CSF return Additional Notes Negative paresthesia. Negative blood return. Positive free-flowing CSF. Expiration date of kit checked and confirmed. Patient tolerated procedure well, without complications.

## 2021-09-12 NOTE — Op Note (Signed)
NAMECLORIA, [name] MEDICAL RECORD NO: [id number] ACCOUNT NO: [id number] DATE OF BIRTH: [birth year] FACILITY: [facility] LOCATION: [facility] PHYSICIAN: Suzy Bouchard, MD  Operative Report   DATE OF PROCEDURE: 09/12/2021  PREOPERATIVE DIAGNOSES: 1.  A 39+0 weeks estimated gestational age. 2.  Elective repeat cesarean section.  POSTOPERATIVE DIAGNOSES: 1.  A 39+0 weeks estimated gestational age. 2.  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 3.  Elective repeat cesarean section. 4.  Vigorous female, delivered.  PROCEDURE:  Repeat low transverse cesarean section.  ANESTHESIA:  Spinal.  SURGEON:  Suzy Bouchard, MD  FIRST ASSISTANT:  Chari Manning, certified nurse midwife.  INDICATIONS:  A 30 year old gravida 2, para 1, patient with a history of a prior cesarean section, who has elected for a repeat cesarean section.  Of note, the patient has been declared incompetent and her legal guardian has agreed to the procedure.  DESCRIPTION OF PROCEDURE:  After adequate spinal anesthesia, the patient was placed in dorsal supine position with hip roll on the right side.  The patient's abdomen was prepped and draped in normal sterile fashion.  The patient did receive 2 grams of IV  Ancef for surgical prophylaxis.  Timeout was performed.  A Pfannenstiel incision was made 2 fingerbreadths above the symphysis pubis.  Sharp dissection was used to identify the fascia and the fascia was then opened in a transverse fashion.  Superior  aspect of the fascia was grasped with Kocher clamps and the rectus muscles were dissected free.  The inferior aspect of the fascia was grasped and the pyramidalis muscle was dissected free.  Entry into the peritoneal cavity was accomplished sharply.  The  vesicouterine peritoneal fold was identified and the bladder flap was reflected inferiorly.  Low transverse uterine incision was made upon entry into the endometrial cavity, clear fluid resulted.  The  incision was extended with blunt transverse  traction.  Fetal head was brought to the incision and a Kiwi bell vacuum was applied to the occiput to aid in the delivery of the head.  One gentle pull allowed for delivery of the head and the vacuum was removed.  Three nuchal cords were noted around  the infant's head.  These were reduced.  The shoulders and the body of the infant were then delivered and a vigorous female was then placed on the mother's abdomen and dried for 60 seconds with delayed cord clamping.  Cord was doubly clamped and a vigorous  female was passed to the nursery staff who assigned Apgar scores of 8 and 9.  Fetal weight 3000 grams.  Time of birth 08:12 on 09/12/2021.  Placenta was manually delivered and the uterus was exteriorized.  Of note, the uterus was asymmetric with what  appeared to be a uterine horn to the left with a rudimentary horn on the right side of the uterus.  Fallopian tubes and ovaries were normal bilaterally.  The endometrial cavity was wiped clean with laparotomy tape and the uterine incision was closed with  #1 chromic suture in a running locking fashion.  Good hemostasis noted.  Posterior cul-de-sac was irrigated and suctioned and the uterus was placed back into the abdominal cavity and the uterine incision again appeared hemostatic.  Interceed was placed  over uterine incision in T shape fashion.  The fascia was then closed with 0 Vicryl suture in a running nonlocking fashion.  Good approximation of edges.  Fascial edges were injected with a solution of 60 mL of 0.5% Marcaine plus 20 mL normal saline.  40  mL of this solution was injected.  Subcutaneous tissues were irrigated and bovied and scarring of the subcutaneous tissues was opened with the Bovie to allow for more cosmetic closure.  Good hemostasis was noted.  The incision was then closed with  Insorb absorbable staples.  Additional 20 mL of Marcaine solution was injected beneath the skin.  There were no  complications.  QUANTITATIVE BLOOD LOSS:  405 mL.  INTRAOPERATIVE FLUIDS:  600 mL.  URINE OUTPUT:  175 mL.  The patient was taken to recovery room in good condition.   PUS D: 09/12/2021 9:32:41 am T: 09/12/2021 10:36:00 am  JOB: [id number]/ [id number]

## 2021-09-13 LAB — CBC
HCT: 27.3 % — ABNORMAL LOW (ref 36.0–46.0)
Hemoglobin: 8.9 g/dL — ABNORMAL LOW (ref 12.0–15.0)
MCH: 26.8 pg (ref 26.0–34.0)
MCHC: 32.6 g/dL (ref 30.0–36.0)
MCV: 82.2 fL (ref 80.0–100.0)
Platelets: 238 10*3/uL (ref 150–400)
RBC: 3.32 MIL/uL — ABNORMAL LOW (ref 3.87–5.11)
RDW: 13.8 % (ref 11.5–15.5)
WBC: 11.2 10*3/uL — ABNORMAL HIGH (ref 4.0–10.5)
nRBC: 0 % (ref 0.0–0.2)

## 2021-09-13 MED ORDER — FERROUS SULFATE 325 (65 FE) MG PO TABS
325.0000 mg | ORAL_TABLET | Freq: Two times a day (BID) | ORAL | Status: DC
  Administered 2021-09-13 – 2021-09-14 (×2): 325 mg via ORAL
  Filled 2021-09-13 (×2): qty 1

## 2021-09-13 NOTE — Progress Notes (Signed)
Post Partum Day 1  Subjective: Doing well, no concerns. Ambulating without difficulty, pain managed with PO meds, tolerating regular diet, and voiding without difficulty.   No fever/chills, chest pain, shortness of breath, nausea/vomiting, or leg pain. No nipple or breast pain.   Objective: BP 130/82 (BP Location: Right Arm)    Pulse 87    Temp 98 F (36.7 C) (Oral)    Resp 18    Ht 5\' 4"  (1.626 m)    Wt 94.8 kg    LMP 12/13/2020    SpO2 100%    Breastfeeding Unknown    BMI 35.87 kg/m    Physical Exam:  General: alert and cooperative Breasts: soft/nontender CV: RRR Pulm: nl effort Abdomen: soft, non-tender Uterine Fundus: firm Incision: Dressing intact Perineum: intact Lochia: appropriate DVT Evaluation: No evidence of DVT seen on physical exam. Edinburgh:  Edinburgh Postnatal Depression Scale Screening Tool 09/13/2021  I have been able to laugh and see the funny side of things. 0  I have looked forward with enjoyment to things. 0  I have blamed myself unnecessarily when things went wrong. 0  I have been anxious or worried for no good reason. 2  I have felt scared or panicky for no good reason. 0  Things have been getting on top of me. 0  I have been so unhappy that I have had difficulty sleeping. 0  I have felt sad or miserable. 0  I have been so unhappy that I have been crying. 0  The thought of harming myself has occurred to me. 0  Edinburgh Postnatal Depression Scale Total 2  Some encounter information is confidential and restricted. Go to Review Flowsheets activity to see all data.     Recent Labs    09/12/21 1010 09/13/21 0556  HGB 9.5* 8.9*  HCT 28.3* 27.3*  WBC 9.9 11.2*  PLT 243 238    Assessment/Plan: 30 y.o. G2P2002 postpartum day # 1  -Continue routine postpartum care -Lactation consult PRN for breastfeeding  -Acute blood loss anemia - hemodynamically stable and asymptomatic; start PO ferrous sulfate BID with stool softeners  -Immunization status:   all immunizations up to date  Disposition: Continue inpatient postpartum care    LOS: 1 day   Savannha Welle, CNM 09/13/2021, 11:02 AM

## 2021-09-13 NOTE — TOC Initial Note (Addendum)
Transition of Care [facility]) - Initial/Assessment Note    Patient Details  Name: [name] MRN: [id number] Date of Birth: 1991-04-22  [facility] CM/SW Contact:    Anselm Pancoast, RN Phone Number: 09/13/2021, 11:28 AM  Clinical Narrative:                 LVMM forJones,Charlita Nelson Sister/Legal Guardian [phone] requesting callback to discuss dc plan/concerns.   CPS report completed with Alex-confirmed patient already has open case with DSS. CPS will follow.   1444: Met with MOB, FOB and guardian at bedside. MOB reports her and the father live together with their 63 year old son. Sister/Guardian lives close as well as paternal grandmother. Sister reports she is able to assist with transportation and as needs develop. MOB was feeding infant and confirmed she would be bottlefeeding. Active with WIC and reports she remains involved with DSS as needed. Infant will be patient of [name] and mother reports she is able to get to follow up OB appointment. Parents and guardian report all needed equipment are in the home. No current needs and CPS/DSS already following family as outpatient.          Patient Goals and CMS Choice        Expected Discharge Plan and Services                                                Prior Living Arrangements/Services                       Activities of Daily Living Home Assistive Devices/Equipment: None ADL Screening (condition at time of admission) Patient's cognitive ability adequate to safely complete daily activities?: Yes Is the patient deaf or have difficulty hearing?: No Does the patient have difficulty seeing, even when wearing glasses/contacts?: No Does the patient have difficulty concentrating, remembering, or making decisions?: No Patient able to express need for assistance with ADLs?: Yes Does the patient have difficulty dressing or bathing?: No Independently performs ADLs?: Yes (appropriate for  developmental age) Does the patient have difficulty walking or climbing stairs?: No Weakness of Legs: None Weakness of Arms/Hands: None  Permission Sought/Granted                  Emotional Assessment              Admission diagnosis:  Previous cesarean delivery affecting pregnancy [O34.219] Postoperative state [Z98.890] Patient Active Problem List   Diagnosis Date Noted   Previous cesarean delivery affecting pregnancy 09/12/2021   Postoperative state 09/12/2021   Sciatic pain 08/03/2021   Uterine size date discrepancy, second trimester 08/03/2021   Abdominal cramping affecting pregnancy 06/12/2021   Encounter for supervision of other normal pregnancy, third trimester 02/15/2021   Hyperemesis affecting pregnancy, antepartum 02/13/2021   Intermittent palpitations 06/25/2017   Family history of diabetes mellitus 07/04/2016   Sepsis ([place]) 11/27/2015   Pyelonephritis 11/27/2015   AKI (acute kidney injury) ([place]) 11/27/2015   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: [phone]: [phone] CPrincella Nelson  [place], [place] [address] Phone: [phone] Fax: [phone]     Social Determinants of Health (SDOH) Interventions    Readmission Risk Interventions No flowsheet data found.

## 2021-09-14 MED ORDER — OXYCODONE HCL 5 MG PO TABS: 5.0000 mg | ORAL_TABLET | ORAL | 0 refills | Status: AC | PRN

## 2021-09-14 MED ORDER — IBUPROFEN 600 MG PO TABS: 600.0000 mg | ORAL_TABLET | Freq: Four times a day (QID) | ORAL | 1 refills | Status: DC | PRN

## 2021-09-14 MED ORDER — ACETAMINOPHEN 500 MG PO TABS: 1000.0000 mg | ORAL_TABLET | Freq: Four times a day (QID) | ORAL | 0 refills | Status: DC | PRN

## 2021-09-14 NOTE — Progress Notes (Signed)
Spoke with CPS, no barriers to discharge at this time

## 2021-09-14 NOTE — Discharge Instructions (Signed)
Cesarean Delivery, Care After Refer to this sheet in the next few weeks. These instructions provide you with information on caring for yourself after your procedure. Your health care provider may also give you specific instructions. Your treatment has been planned according to current medical practices, but problems sometimes occur. Call your health care provider if you have any problems or questions after you go home. HOME CARE INSTRUCTIONS  Please leave honey comb dressing (OP Site) on for 7 days.  You may shower during this period but turn your back to the water so that the dressing does not get directly saturated by the water.   You may take the dressing off on day 7.  The easiest way to do it is in the shower.  Allow the water to run over the dressing and it usually comes off easier.   Only take over-the-counter or prescription medications as directed by your health care provider. Do not drink alcohol, especially if you are breastfeeding or taking medication to relieve pain. Do not  smoke tobacco. Continue to use good perineal care. Good perineal care includes: Wiping your perineum from front to back. Keeping your perineum clean. Check your surgical cut (incision) daily for increased redness, drainage, swelling, or separation of skin. Shower and clean your incision gently with soap and water every day, by letting warm and soapy water run over the incision, and then pat it dry. If your health care provider says it is okay, leave the incision uncovered. Use a bandage (dressing) if the incision is draining fluid or appears irritated. If the adhesive strips across the incision do not fall off within 7 days, carefully peel them off, after a shower. Hug a pillow when coughing or sneezing until your incision is healed. This helps to relieve pain. Do not use tampons, douches or have sexual intercourse, until your health care provider says it is okay. Wear a well-fitting bra that provides breast  support. Limit wearing support panties or control-top hose. Drink enough fluids to keep your urine clear or pale yellow. Eat high-fiber foods such as whole grain cereals and breads, brown rice, beans, and fresh fruits and vegetables every day. These foods may help prevent or relieve constipation. Resume activities such as climbing stairs, driving, lifting, exercising, or traveling as directed by your health care provider. Try to have someone help you with your household activities and your newborn for at least a few days after you leave the hospital. Rest as much as possible. Try to rest or take a nap when your newborn is sleeping. Increase your activities gradually. Do not lift more than 15lbs until directed by a provider. Keep all of your scheduled postpartum appointments. It is very important to keep your scheduled follow-up appointments. At these appointments, your health care provider will be checking to make sure that you are healing physically and emotionally. SEEK MEDICAL CARE IF:  You are passing large clots from your vagina. Save any clots to show your health care provider. You have a foul smelling discharge from your vagina. You have trouble urinating. You are urinating frequently. You have pain when you urinate. You have a change in your bowel movements. You have increasing redness, pain, or swelling near your incision. You have pus draining from your incision. Your incision is separating. You have painful, hard, or reddened breasts. You have a severe headache. You have blurred vision or see spots. You feel sad or depressed. You have thoughts of hurting yourself or your newborn. You have questions   about your care, the care of your newborn, or medications. You are dizzy or light-headed. You have a rash. You have pain, redness, or swelling at the site of the removed intravenous access (IV) tube. You have nausea or vomiting. You stopped breastfeeding and have not had a  menstrual period within 12 weeks of stopping. You are not breastfeeding and have not had a menstrual period within 12 weeks of delivery. You have a fever. SEEK IMMEDIATE MEDICAL CARE IF: You have persistent pain. You have chest pain. You have shortness of breath. You faint. You have leg pain. You have stomach pain. Your vaginal bleeding saturates 2 or more sanitary pads in 1 hour. MAKE SURE YOU:  Understand these instructions. Will watch your condition. Will get help right away if you are not doing well or get worse. Document Released: 05/25/2002 Document Revised: 01/17/2014 Document Reviewed: 04/29/2012 ExitCare Patient Information 2015 [employer]. This information is not intended to replace advice given to you by your health care provider. Make sure you discuss any questions you have with your health care provider.  

## 2021-09-14 NOTE — Progress Notes (Signed)
Patient discharged home with infant. Discharge instructions, prescriptions and follow up appointment given to and reviewed with patient. Patient verbalized understanding.   Everything gone over with pt and legal guardian present

## 2021-09-19 ENCOUNTER — Telehealth: Payer: Self-pay | Admitting: Licensed Clinical Social Worker

## 2021-09-19 NOTE — Telephone Encounter (Signed)
-----   Message from Dorthy Cooler, RN sent at 08/31/2021 12:27 PM EST ----- Regarding: RE: missed appt. I just spoke with her, She said she was not physically well that day, She also has a new phone number [phone] ----- Message ----- From: Milton Ferguson, LCSW Sent: 08/29/2021  11:37 AM EST To: Dorthy Cooler, RN Subject: missed appt.                                   Hi Cassandra,  I wanted to reach out to you to let you know that this pt. Missed her appt. With me today. This is the first appt. She has missed so was a little concerned about her.  Thank you,  Estill Bamberg

## 2021-11-19 ENCOUNTER — Telehealth: Payer: Self-pay | Admitting: Licensed Clinical Social Worker

## 2021-11-19 NOTE — Telephone Encounter (Signed)
Patient left vm requesting an appointment. [phone] ?

## 2022-03-15 NOTE — Progress Notes (Addendum)
03-15-2022; Late entry for Feb 08, 2022. Received a Control and instrumentation engineer and Subpoena ffrom the [place] of [address], [facility] for medical records on patient [name], [place]: 1990/11/28. Per [facility] Order by Consuelo Pandy R. Freida Busman Sr., a copy of all [facility] medical records on the above named patient was hand delivered in a securely closed box to the [address] [place] of [place], and hand delivered to [employer], [place], at the [employer], [address] [place], [place] [address]. Medical record copies released included: 12-27-2013 STD flow sheet, basic lab form, test results for HIV, RPR, GC/Chlamydia and NCIR report; 11-08-2009 pregnancy test form; 01-17-2016 Kathreen Cosier LCSW phone note; Kathreen Cosier LCSW notes from 01-11-2015, 01-16-2015, and 12-28-2014 LCSW counselor referral follow up, mental status assessment, summary impression, recommendation, goals and treatment services; 01-04-2015 Kathreen Cosier LCSW client progress notes; 12-29-2014 clinical list changes; health data archiver; 02-28-2021 to 11-19-2021 List of LCSW Kathreen Cosier counselor, telephone and documention dates; 02-28-2021 LCSW Kathreen Cosier notes; 03-29-2021 Kathreen Cosier LCSW progress notes, default flowsheet data; level of service and encounter charges, history, allergies, counselor initial adult exam notes; 04-04-2021 LCSW Kathreen Cosier counselor/therapist progress notes, default flowsheet data, level of service charges, history, allergies, conversation follow up; 04-16-2021 LCSW Kathreen Cosier progress notes, level of service, history, allergies; 04-19-2021 LCSW all notes, progress notes, default flows heet data, level of service charges, history, allergies, conversation follow up; 05-10-2021 LCSW all notes, progress notes, level of service and charges, history, allergies, conversation follow up; 05-30-2021 LCSW all notes, progress notes, default flow  sheet data, level of service charges, history, allergies; 06-06-2021 LCSW all notes, progress notes, default flow sheet data, level of service and charges, history, allergies, conversation follow up; 06-21-2021 LCSW all notes, progress notes, level of service and charges, default flow sheet data, history, allergies, conversation follow up; 06-28-2021 LCSW all notes, progress notes, default flow sheet data, level of service and charges, allergies, conversation follow up; 07-19-2021 LCSW all notes, progress notes, default flow sheet data, level of service and charges, medical history, allergies; 08-01-2021 LCSW all notes, progress notes, level of service, hisotry, allergies; 08-29-2021 LCSW telephone encounter, level of service, history, allergies, conversation follow up, level of service, history, allergies, call documentation; 09-19-2021 LCSW telephone call, level of service, history, call documentation, allergies; 11-19-2021 LCSW telephone call, level of service, history, allergies, call documentation; [facility] Medical Records for Case Management for dates of service 02-28-2021 to 11-20-2021. Herby Abraham RN, 03-15-2022; 03-15-2022 See copies of [facility] 02-08-2022 Subpoena and 01-30-2022 [facility] Order scanned into EMR. Herby Abraham RN.

## 2022-04-15 ENCOUNTER — Other Ambulatory Visit: Payer: Self-pay

## 2022-04-15 ENCOUNTER — Encounter: Payer: Self-pay | Admitting: Emergency Medicine

## 2022-04-15 ENCOUNTER — Emergency Department
Admission: EM | Admit: 2022-04-15 | Discharge: 2022-04-15 | Disposition: A | Discharge status: Home / Self Care | Payer: Medicare Other | Attending: Physician Assistant | Admitting: Physician Assistant

## 2022-04-15 DIAGNOSIS — R600 Localized edema: Secondary | ICD-10-CM | POA: Insufficient documentation

## 2022-04-15 DIAGNOSIS — M7989 Other specified soft tissue disorders: Secondary | ICD-10-CM | POA: Diagnosis present

## 2022-04-15 DIAGNOSIS — R609 Edema, unspecified: Secondary | ICD-10-CM

## 2022-04-15 LAB — POC URINE PREG, ED: Preg Test, Ur: NEGATIVE

## 2022-04-15 MED ORDER — HYDROCHLOROTHIAZIDE 25 MG PO TABS: 25.0000 mg | ORAL_TABLET | Freq: Every day | ORAL | 1 refills | Status: DC

## 2022-04-15 NOTE — ED Provider Notes (Signed)
   [facility] Provider Note    None    (approximate)   History   Leg Swelling   HPI BRYAHNA LESKO is a 31 y.o. female complains of leg swelling for 3 weeks.  Has an appointment with [facility] for Aug 15.  No cp or sob      Physical Exam   Triage Vital Signs: ED Triage Vitals [04/15/22 1456]  Enc Vitals Group     BP      Pulse      Resp      Temp      Temp src      SpO2      Weight      Height      Head Circumference      Peak Flow      Pain Score 9     Pain Loc      Pain Edu?      Excl. in GC?     Most recent vital signs: Vitals:   04/15/22 1514  BP: (!) 138/96  Pulse: 89  Resp: 14  Temp: 97.9 F (36.6 C)  SpO2: 91%     General: Awake, no distress.   CV:  Good peripheral perfusion. regular rate and  rhythm Resp:  Normal effort. Abd:  No distention.   Other:  Extremeities with minimal swelling noted, no calf pain, nv intact   ED Results / Procedures / Treatments   Labs (all labs ordered are listed, but only abnormal results are displayed) Labs Reviewed  POC URINE PREG, ED     EKG     RADIOLOGY     PROCEDURES:   Procedures   MEDICATIONS ORDERED IN ED: Medications - No data to display   IMPRESSION / MDM / ASSESSMENT AND PLAN / ED COURSE  I reviewed the triage vital signs and the nursing notes.                              Differential diagnosis includes, but is not limited to, edema, dependent edema, dvt  Patient's presentation is most consistent with acute, uncomplicated illness.   Patient has b/l swelling and it is very minimal, no cp or sob so feel that dvt is less likely  If preg test is negative will treat with hctz to help with dependent edema  Pregnancy test is negative.  Had long discussion with patient about salt intake, dependent edema.  She is still to [facility].  She does take HCTZ for the extra fluid.  Told her she does not need to take this  every day the swelling in her legs decreases after elevating them she does not need to take medication.  Agreement treatment plan.  Discharged stable condition      FINAL CLINICAL IMPRESSION(S) / ED DIAGNOSES   Final diagnoses:  Peripheral edema     Rx / DC Orders   ED Discharge Orders          Ordered    hydrochlorothiazide (HYDRODIURIL) 25 MG tablet  Daily        04/15/22 1527             Note:  This document was prepared using Dragon voice recognition software and may include unintentional dictation errors.    Faythe Ghee, PA-C 04/15/22 1645    Chesley Noon, MD 04/15/22 2239

## 2022-04-15 NOTE — ED Triage Notes (Signed)
Leg swelling 3 weeks.

## 2022-04-15 NOTE — ED Notes (Signed)
Legal guardian Archie Patten contacted and updated about patients arrival and discharge. Archie Patten gave permission to treat.

## 2022-05-07 ENCOUNTER — Ambulatory Visit (LOCAL_COMMUNITY_HEALTH_CENTER): Payer: Medicare Other

## 2022-05-07 VITALS — BP 121/81 | Ht 64.0 in | Wt 196.0 lb

## 2022-05-07 DIAGNOSIS — Z3201 Encounter for pregnancy test, result positive: Secondary | ICD-10-CM

## 2022-05-07 MED ORDER — PRENATAL 27-0.8 MG PO TABS: 1.0000 | ORAL_TABLET | Freq: Every day | ORAL | 0 refills | Status: AC

## 2022-05-07 NOTE — Progress Notes (Signed)
UPT positive. Plans prenatal care at [facility]. States she's here for preg test today because case worker Davis Vannatter) wanted her to have test.  Pt explains her children are in foster care currently.   States hx HBP, but takes no meds currently. BP today 121/81.   The patient was dispensed prenatal vitamins today. I provided counseling today regarding the medication. We discussed the medication, the side effects and when to call clinic. Patient given the opportunity to ask questions. Questions answered.    Visit today completed after 5 pm and pt plans to contact DSS for medicaid/preg women and [facility] tomorrow. Proof of preg documentation form and positive preg packet given and reviewed. Jerel Shepherd, RN

## 2022-05-08 LAB — PREGNANCY, URINE: Preg Test, Ur: POSITIVE — AB

## 2022-05-09 ENCOUNTER — Emergency Department: Payer: Medicare Other

## 2022-05-09 ENCOUNTER — Other Ambulatory Visit: Payer: Self-pay

## 2022-05-09 DIAGNOSIS — O26891 Other specified pregnancy related conditions, first trimester: Secondary | ICD-10-CM | POA: Diagnosis not present

## 2022-05-09 DIAGNOSIS — R1084 Generalized abdominal pain: Secondary | ICD-10-CM | POA: Diagnosis not present

## 2022-05-09 DIAGNOSIS — Z3A01 Less than 8 weeks gestation of pregnancy: Secondary | ICD-10-CM | POA: Diagnosis not present

## 2022-05-09 LAB — POC URINE PREG, ED: Preg Test, Ur: POSITIVE — AB

## 2022-05-09 LAB — URINALYSIS, ROUTINE W REFLEX MICROSCOPIC
Bilirubin Urine: NEGATIVE
Glucose, UA: NEGATIVE mg/dL
Hgb urine dipstick: NEGATIVE
Ketones, ur: NEGATIVE mg/dL
Leukocytes,Ua: NEGATIVE
Nitrite: NEGATIVE
Protein, ur: NEGATIVE mg/dL
Specific Gravity, Urine: 1.02 (ref 1.005–1.030)
pH: 6 (ref 5.0–8.0)

## 2022-05-09 LAB — COMPREHENSIVE METABOLIC PANEL WITH GFR
ALT: 10 U/L (ref 0–44)
AST: 15 U/L (ref 15–41)
Albumin: 3.6 g/dL (ref 3.5–5.0)
Alkaline Phosphatase: 50 U/L (ref 38–126)
Anion gap: 7 (ref 5–15)
BUN: 18 mg/dL (ref 6–20)
CO2: 21 mmol/L — ABNORMAL LOW (ref 22–32)
Calcium: 9.2 mg/dL (ref 8.9–10.3)
Chloride: 108 mmol/L (ref 98–111)
Creatinine, Ser: 0.9 mg/dL (ref 0.44–1.00)
GFR, Estimated: >60 mL/min
Glucose, Bld: 96 mg/dL (ref 70–99)
Potassium: 3.9 mmol/L (ref 3.5–5.1)
Sodium: 136 mmol/L (ref 135–145)
Total Bilirubin: 0.3 mg/dL (ref 0.3–1.2)
Total Protein: 7.1 g/dL (ref 6.5–8.1)

## 2022-05-09 LAB — CBC
HCT: 29.8 % — ABNORMAL LOW (ref 36.0–46.0)
Hemoglobin: 9.5 g/dL — ABNORMAL LOW (ref 12.0–15.0)
MCH: 26 pg (ref 26.0–34.0)
MCHC: 31.9 g/dL (ref 30.0–36.0)
MCV: 81.4 fL (ref 80.0–100.0)
Platelets: 312 10*3/uL (ref 150–400)
RBC: 3.66 MIL/uL — ABNORMAL LOW (ref 3.87–5.11)
RDW: 13.2 % (ref 11.5–15.5)
WBC: 9.8 10*3/uL (ref 4.0–10.5)
nRBC: 0 % (ref 0.0–0.2)

## 2022-05-09 LAB — LIPASE, BLOOD: Lipase: 34 U/L (ref 11–51)

## 2022-05-09 LAB — HCG, QUANTITATIVE, PREGNANCY: hCG, Beta Chain, Quant, S: 37381 m[IU]/mL — ABNORMAL HIGH

## 2022-05-09 NOTE — ED Triage Notes (Signed)
Pt presents to ER c/o generalized abd pain that started today.  Pain is cramping in nature and "goes everywhere."  Pt denies n/v/d, or any vaginal bleeding.  Pt is pregnant, but unsure how far along she is.  Pt denies any sick contacts.  Pt is A&O x4 at this time in NAD in triage.

## 2022-05-10 ENCOUNTER — Emergency Department
Admission: EM | Admit: 2022-05-10 | Discharge: 2022-05-10 | Disposition: A | Discharge status: Home / Self Care | Payer: Medicare Other | Attending: Emergency Medicine | Admitting: Emergency Medicine

## 2022-05-10 DIAGNOSIS — O26891 Other specified pregnancy related conditions, first trimester: Secondary | ICD-10-CM

## 2022-05-10 DIAGNOSIS — Z3490 Encounter for supervision of normal pregnancy, unspecified, unspecified trimester: Secondary | ICD-10-CM

## 2022-05-10 NOTE — ED Notes (Signed)
Patient states that her legal guardian is no longer her sister Jamie Kato) she is now under the care of [facility] DSS (no paperwork provided at this time).

## 2022-05-10 NOTE — ED Notes (Signed)
Pt back from [place]

## 2022-05-10 NOTE — ED Provider Notes (Signed)
[facility] Provider Note    Event Date/Time   First MD Initiated Contact with Patient 05/10/22 0012     (approximate)   History   Abdominal Pain   HPI  [name] is a 31 y.o. female G3, P2 at unknown gestational age.  She presents for generalized abdominal pain.  No recent vaginal bleeding.  She cannot really describe how her abdomen was feeling, just said that it hurts.  It feels better now with no persistent pain.  She denies dysuria, nausea, vomiting, vaginal bleeding, vaginal itching, fever/chills, chest pain, shortness of breath.  She goes to [facility] for prenatal care.  Her first appointment is in about a month.  She has not had an ultrasound.     Physical Exam   Triage Vital Signs: ED Triage Vitals [05/09/22 2157]  Enc Vitals Group     BP (!) 128/90     Pulse Rate 80     Resp 18     Temp 98.7 F (37.1 C)     Temp Source Oral     SpO2 95 %     Weight      Height      Head Circumference      Peak Flow      Pain Score      Pain Loc      Pain Edu?      Excl. in GC?     Most recent vital signs: Vitals:   05/10/22 0100 05/10/22 0200  BP: (!) 139/90 (!) 128/97  Pulse: 83 85  Resp: 16   Temp:    SpO2: 100% 100%     General: Awake, no distress.  Well-appearing. CV:  Good peripheral perfusion.  Resp:  Normal effort.  Abd:  No distention.  No tenderness to palpation anywhere in the abdomen. Other:  Deferred GU exam.   ED Results / Procedures / Treatments   Labs (all labs ordered are listed, but only abnormal results are displayed) Labs Reviewed  CBC - Abnormal; Notable for the following components:      Result Value   RBC 3.66 (*)    Hemoglobin 9.5 (*)    HCT 29.8 (*)    All other components within normal limits  URINALYSIS, ROUTINE W REFLEX MICROSCOPIC - Abnormal; Notable for the following components:   Color, Urine YELLOW (*)    APPearance CLEAR (*)    All other components within normal limits  HCG,  QUANTITATIVE, PREGNANCY - Abnormal; Notable for the following components:   hCG, Beta Chain, Quant, S 37,381 (*)    All other components within normal limits  COMPREHENSIVE METABOLIC PANEL - Abnormal; Notable for the following components:   CO2 21 (*)    All other components within normal limits  POC URINE PREG, ED - Abnormal; Notable for the following components:   Preg Test, Ur POSITIVE (*)    All other components within normal limits  LIPASE, BLOOD     RADIOLOGY I viewed and interpreted the patient's OB/transvaginal ultrasound.  I can appreciate a fetus with cardiac activity.  The radiologist confirmed that there is a single intrauterine pregnancy of about [redacted] weeks gestational age with no obvious acute abnormalities.    PROCEDURES:  Critical Care performed: No  Procedures   MEDICATIONS ORDERED IN ED: Medications - No data to display   IMPRESSION / MDM / ASSESSMENT AND PLAN / ED COURSE  I reviewed the triage vital signs and the nursing notes.  Differential diagnosis includes, but is not limited to, ectopic pregnancy, failed pregnancy, intrauterine live pregnancy, other acute intra-abdominal abnormality such as diverticulitis or appendicitis.  Patient's presentation is most consistent with acute presentation with potential threat to life or bodily function.  However, patient's work-up is reassuring.  Vital signs are normal.  Labs/studies ordered: CBC, urinalysis, beta-hCG, lipase, CMP, urine pregnancy, transvaginal ultrasound.  Evaluation was very reassuring.  Labs are all normal other than positive pregnancy test and they are appropriately positive.  As documented above, ultrasound shows a single intrauterine pregnancy at about [redacted] weeks gestation, no acute abnormalities.  When I reassessed the patient she is reporting no pain at all.  She is comfortable with the plan for discharge and outpatient follow-up.  I gave my usual and customary return  precautions.      FINAL CLINICAL IMPRESSION(S) / ED DIAGNOSES   Final diagnoses:  Early stage of pregnancy  Abdominal pain during pregnancy in first trimester     Rx / DC Orders   ED Discharge Orders     None        Note:  This document was prepared using Dragon voice recognition software and may include unintentional dictation errors.   Loleta Rose, MD 05/10/22 [phone]

## 2022-05-10 NOTE — ED Notes (Signed)
Pt in [place]

## 2022-05-10 NOTE — Discharge Instructions (Signed)
Your workup in the Emergency Department today was reassuring.  We did not find any specific abnormalities, and you had a normal ultrasound showing a single fetus at about [redacted] weeks gestation.  We recommend you drink plenty of fluids, take your regular medications and/or any new ones prescribed today, and follow up with the doctor(s) listed in these documents as recommended.  Return to the Emergency Department if you develop new or worsening symptoms that concern you.

## 2022-05-30 ENCOUNTER — Encounter: Payer: Self-pay | Admitting: Emergency Medicine

## 2022-05-30 ENCOUNTER — Other Ambulatory Visit: Payer: Self-pay

## 2022-05-30 ENCOUNTER — Emergency Department
Admission: EM | Admit: 2022-05-30 | Discharge: 2022-05-30 | Disposition: A | Discharge status: Home / Self Care | Payer: Medicare Other | Attending: Emergency Medicine | Admitting: Emergency Medicine

## 2022-05-30 DIAGNOSIS — O2686 Pruritic urticarial papules and plaques of pregnancy (PUPPP): Secondary | ICD-10-CM | POA: Insufficient documentation

## 2022-05-30 DIAGNOSIS — T7840XA Allergy, unspecified, initial encounter: Secondary | ICD-10-CM

## 2022-05-30 DIAGNOSIS — Z3A08 8 weeks gestation of pregnancy: Secondary | ICD-10-CM | POA: Insufficient documentation

## 2022-05-30 MED ORDER — METHYLPREDNISOLONE SODIUM SUCC 125 MG IJ SOLR
125.0000 mg | Freq: Once | INTRAMUSCULAR | Status: AC
  Administered 2022-05-30: 125 mg via INTRAVENOUS
  Filled 2022-05-30: qty 2

## 2022-05-30 MED ORDER — DIPHENHYDRAMINE HCL 50 MG/ML IJ SOLN
25.0000 mg | Freq: Once | INTRAMUSCULAR | Status: AC
  Administered 2022-05-30: 25 mg via INTRAVENOUS
  Filled 2022-05-30: qty 1

## 2022-05-30 MED ORDER — FAMOTIDINE IN NACL 20-0.9 MG/50ML-% IV SOLN
20.0000 mg | Freq: Once | INTRAVENOUS | Status: AC
  Administered 2022-05-30: 20 mg via INTRAVENOUS
  Filled 2022-05-30: qty 50

## 2022-05-30 MED ORDER — EPINEPHRINE 0.3 MG/0.3ML IJ SOAJ: 0.3000 mg | INTRAMUSCULAR | 0 refills | Status: AC | PRN

## 2022-05-30 MED ORDER — PREDNISONE 20 MG PO TABS: 40.0000 mg | ORAL_TABLET | Freq: Every day | ORAL | 0 refills | Status: AC

## 2022-05-30 NOTE — ED Notes (Signed)
Pt verbalized understanding of discharge instructions, prescriptions, and follow-up care instructions. Pt advised if symptoms worsen to return to ED. E-signature not available due to signature pad not working.  

## 2022-05-30 NOTE — ED Provider Notes (Signed)
   [facility] Provider Note    Event Date/Time   First MD Initiated Contact with Patient 05/30/22 1255     (approximate)   History   Allergic Reaction   HPI  [name] is a 31 y.o. female who is about [redacted] weeks pregnant and comes in for sudden onset of itching and hives.  She is not sure if she was stung by anything.  Does not think she ate anything different today.  She denies any respiratory issues or throat swelling or vomiting or other concerns.  She denies any vaginal bleeding, gush of fluid, lower abdominal cramping or other concerns.  Physical Exam   Triage Vital Signs: Blood pressure (!) 147/95, pulse 94, temperature 98 F (36.7 C), temperature source Oral, resp. rate (!) 26, height 5\' 4"  (1.626 m), weight 89.8 kg, SpO2 98 %.  Most recent vital signs: Vitals:   05/30/22 1305  BP: (!) 147/95  Pulse: 94  Resp: (!) 26  Temp: 98 F (36.7 C)  SpO2: 98%     General: Awake, no distress.  CV:  Good peripheral perfusion.  Resp:  Normal effort.  Abd:  No distention.  Other:  Patient has hives noted on her arms and chest and legs She has no oropharynx swelling or facial swelling.  Her lungs are clear to auscultation no vomiting.   ED Results / Procedures / Treatments   [place] PROCEDURES:  Critical Care performed: No  Procedures   MEDICATIONS ORDERED IN ED: Medications - No data to display   IMPRESSION / MDM / ASSESSMENT AND PLAN / ED COURSE  I reviewed the triage vital signs and the nursing notes.   Patient's presentation is most consistent with acute presentation with potential threat to life or bodily function.   Patient comes in with allergic reactions with [place] area.  No evidence of anaphylaxis given only skin involvement.  I reviewed with pharmacy and it was okay to proceed with Solu-Medrol, Pepcid, Benadryl even though she is pregnant.  Patient has no abdominal pain and denies any symptoms such as vaginal discharge, vaginal  bleeding or gush of fluids to suggest ectopic pregnancy and she is following up at the health department and has a upcoming appointment soon.   Patient on repeat evaluation has resolution of the hives.  We discussed EpiPen for future reactions or only if she has difficulty breathing and she expressed understanding.  We will do short course of steroids to help prevent rebound due to the significant mount of hives that patient had.  She does report that she was working outside and she thinks that she could have just been stung by something.  Has been in the ER for over 3 hours without any recurrent symptoms   FINAL CLINICAL IMPRESSION(S) / ED DIAGNOSES   Final diagnoses:  Allergic reaction, initial encounter     Rx / DC Orders   ED Discharge Orders          Ordered    predniSONE (DELTASONE) 20 MG tablet  Daily with breakfast        05/30/22 1548    EPINEPHrine 0.3 mg/0.3 mL IJ SOAJ injection  As needed        05/30/22 1548             Note:  This document was prepared using Dragon voice recognition software and may include unintentional dictation errors.   06/01/22, MD 05/30/22 [phone]

## 2022-05-30 NOTE — ED Triage Notes (Signed)
Presents via EMS from work  States she was at work   and had sudden onset of itching and hives   No resp diff noted

## 2022-05-30 NOTE — Discharge Instructions (Signed)
You can take Benadryl 25 mg every 8 hours as needed as this although this can cause some sleepiness.  Take the steroids to prevent a rebound reaction.    Only use the EpiPen if needed for a future reaction or where you cannot breathe due to swelling in your throat and you would still need to return to the ER afterwards if you need to use it

## 2022-06-14 NOTE — Progress Notes (Signed)
06-14-2022 Received a request for medical records  copies of "all prenatal care as of 2023, appt dates, notes, labs, meds etc." on patient Norelle Runnion, dob[birth year] from [name] at [facility]. Also received a copy of a  "[place] Letters of Appointment Guardian of the [place] as guardian of Samatha Anspach for the [employer] of "custody, care and control of the ward". Letter also states [employer]. DSS "has not authority to receive, manage or administer the property, estate or business affairs of the ward."  See copy of ROI and guardianship letter. Letter and ROI reviewed by [facility] county attorney who approved release of requested medical records. Per request and approval, copies of [facility] 05-07-2022 pregnancy test office visit mailed certified mail to [name] at [facility] at [address] [place], [place] [address]. Inetta Fermo RN.

## 2022-07-07 ENCOUNTER — Other Ambulatory Visit: Payer: Self-pay

## 2022-07-07 ENCOUNTER — Emergency Department
Admission: EM | Admit: 2022-07-07 | Discharge: 2022-07-07 | Disposition: A | Discharge status: Home / Self Care | Payer: Medicare Other | Attending: Emergency Medicine | Admitting: Emergency Medicine

## 2022-07-07 DIAGNOSIS — R109 Unspecified abdominal pain: Secondary | ICD-10-CM | POA: Insufficient documentation

## 2022-07-07 DIAGNOSIS — Z3A14 14 weeks gestation of pregnancy: Secondary | ICD-10-CM | POA: Insufficient documentation

## 2022-07-07 DIAGNOSIS — M545 Low back pain, unspecified: Secondary | ICD-10-CM | POA: Insufficient documentation

## 2022-07-07 DIAGNOSIS — O26892 Other specified pregnancy related conditions, second trimester: Secondary | ICD-10-CM | POA: Insufficient documentation

## 2022-07-07 LAB — COMPREHENSIVE METABOLIC PANEL
ALT: 10 U/L (ref 0–44)
AST: 15 U/L (ref 15–41)
Albumin: 3.6 g/dL (ref 3.5–5.0)
Alkaline Phosphatase: 41 U/L (ref 38–126)
Anion gap: 7 (ref 5–15)
BUN: 12 mg/dL (ref 6–20)
CO2: 21 mmol/L — ABNORMAL LOW (ref 22–32)
Calcium: 9.5 mg/dL (ref 8.9–10.3)
Chloride: 110 mmol/L (ref 98–111)
Creatinine, Ser: 0.71 mg/dL (ref 0.44–1.00)
GFR, Estimated: >60 mL/min (ref >60)
Glucose, Bld: 92 mg/dL (ref 70–99)
Potassium: 3.3 mmol/L — ABNORMAL LOW (ref 3.5–5.1)
Sodium: 138 mmol/L (ref 135–145)
Total Bilirubin: 0.6 mg/dL (ref 0.3–1.2)
Total Protein: 7.5 g/dL (ref 6.5–8.1)

## 2022-07-07 LAB — URINALYSIS, ROUTINE W REFLEX MICROSCOPIC
Bilirubin Urine: NEGATIVE
Glucose, UA: NEGATIVE mg/dL
Hgb urine dipstick: NEGATIVE
Ketones, ur: 5 mg/dL — AB
Leukocytes,Ua: NEGATIVE
Nitrite: NEGATIVE
Protein, ur: NEGATIVE mg/dL
Specific Gravity, Urine: 1.025 (ref 1.005–1.030)
pH: 5 (ref 5.0–8.0)

## 2022-07-07 LAB — CBC
HCT: 31.1 % — ABNORMAL LOW (ref 36.0–46.0)
Hemoglobin: 10 g/dL — ABNORMAL LOW (ref 12.0–15.0)
MCH: 25.8 pg — ABNORMAL LOW (ref 26.0–34.0)
MCHC: 32.2 g/dL (ref 30.0–36.0)
MCV: 80.2 fL (ref 80.0–100.0)
Platelets: 292 10*3/uL (ref 150–400)
RBC: 3.88 MIL/uL (ref 3.87–5.11)
RDW: 13.4 % (ref 11.5–15.5)
WBC: 9.5 10*3/uL (ref 4.0–10.5)
nRBC: 0 % (ref 0.0–0.2)

## 2022-07-07 LAB — HCG, QUANTITATIVE, PREGNANCY: hCG, Beta Chain, Quant, S: 41877 m[IU]/mL — ABNORMAL HIGH (ref <5)

## 2022-07-07 LAB — LIPASE, BLOOD: Lipase: 30 U/L (ref 11–51)

## 2022-07-07 MED ORDER — DOXYLAMINE SUCCINATE (SLEEP) 25 MG PO TABS: 25.0000 mg | ORAL_TABLET | Freq: Every evening | ORAL | 0 refills | Status: DC | PRN

## 2022-07-07 MED ORDER — VITAMIN B-6 25 MG PO TABS: 25.0000 mg | ORAL_TABLET | Freq: Every day | ORAL | 0 refills | Status: AC

## 2022-07-07 NOTE — Discharge Instructions (Addendum)
You can take Tylenol for your back pain and use a heating pad.  I prescribed you doxylamine and pyridoxine for nausea and vomiting.  Please follow-up with your OB/GYN.

## 2022-07-07 NOTE — ED Notes (Signed)
Can not get guardian on phone. Left message. Calling next family member

## 2022-07-07 NOTE — ED Triage Notes (Signed)
Pt to ED via POV from home. Pt reports RLQ and RL back pain x2 weeks. Pt reports strong odor to urine and burning with urination.   Pt currently pregnant. EDD 01/01/2023. Pt follows up with OB at [facility]

## 2022-07-07 NOTE — ED Provider Notes (Signed)
[facility] Provider Note    Event Date/Time   First MD Initiated Contact with Patient 07/07/22 1607     (approximate)   History   Back Pain   HPI  [name] is a 31 y.o. female G3, P2 who presents with low back pain and abdominal pain.  Is currently about [redacted] weeks pregnant.  Symptoms been going on for about a week.  Pain is located in the lower back radiates around to the bilateral lower abdomen.  Is rather constant feels worse when she is been standing for a while when she does not rest.  No symptoms in her legs including no numbness tingling or weakness.  Denies associated vaginal bleeding or vaginal discharge.  Does endorse some burning with urination thinks is because of a lesion on her vulva.  She denies fevers or chills has had some morning sickness.  She follows at [facility] for her prenatal care.     Past Medical History:  Diagnosis Date   Depression    Herpes labialis    Mild mental retardation    PONV (postoperative nausea and vomiting)     Patient Active Problem List   Diagnosis Date Noted   Previous cesarean delivery affecting pregnancy 09/12/2021   Postoperative state 09/12/2021   Sciatic pain 08/03/2021   Uterine size date discrepancy, second trimester 08/03/2021   Abdominal cramping affecting pregnancy 06/12/2021   Encounter for supervision of other normal pregnancy, third trimester 02/15/2021   Hyperemesis affecting pregnancy, antepartum 02/13/2021   Intermittent palpitations 06/25/2017   Family history of diabetes mellitus 07/04/2016   Sepsis (HCC) 11/27/2015   Pyelonephritis 11/27/2015   AKI (acute kidney injury) (HCC) 11/27/2015   Herpes simplex virus (HSV) infection 03/31/2014   Major depressive disorder, 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 --      Excl. in GC? --     Most recent vital signs: Vitals:   07/07/22 1553  BP: 127/78  Pulse: 90  Resp: 18  Temp: 98.2 F (36.8 C)  SpO2: 100%     General: Awake, no distress.  CV:  Good peripheral perfusion.  Resp:  Normal effort.  Abd:  No distention.  Gravid uterus, abdomen is soft and nontender Neuro:             Awake, Alert, Oriented x 3  Other:  Mild tenderness to palpation over the sacrum in the midline no overlying skin changes Small area of folliculitis on the left medial thigh and left labia majora   ED Results / Procedures / Treatments  Labs (all labs ordered are listed, but only abnormal results are displayed) Labs Reviewed  COMPREHENSIVE METABOLIC PANEL - Abnormal; Notable for the following components:      Result Value  Potassium 3.3 (*)    CO2 21 (*)    All other components within normal limits  CBC - Abnormal; Notable for the following components:   Hemoglobin 10.0 (*)    HCT 31.1 (*)    MCH 25.8 (*)    All other components within normal limits  URINALYSIS, ROUTINE W REFLEX MICROSCOPIC - Abnormal; Notable for the following components:   Color, Urine YELLOW (*)    APPearance HAZY (*)    Ketones, ur 5 (*)    All other components within normal limits  HCG, QUANTITATIVE, PREGNANCY - Abnormal; Notable for the following components:   hCG, Beta Chain, Quant, S 41,877 (*)    All other components within normal limits  LIPASE, BLOOD     EKG     RADIOLOGY  Bedside ultrasound of the pelvic organs performed by myself shows an intrauterine pregnancy, there is positive fetal movement fetal heart rate 150  PROCEDURES:  Critical Care performed: No  Procedures   MEDICATIONS  ORDERED IN ED: Medications - No data to display   IMPRESSION / MDM / [place] / ED COURSE  I reviewed the triage vital signs and the nursing notes.                              Patient's presentation is most consistent with acute complicated illness / injury requiring diagnostic workup.  Differential diagnosis includes, but is not limited to, SI joint pain, back pain related to gravid uterus, less likely spinal dural abscess, ovarian torsion UTI, pyelonephritis  31 year old female approximately [redacted] weeks pregnant who presents with low back pain radiating around to the abdomen for about 1 week.  Pain is symmetric does not localize to either lower quadrant there is no radiation of symptoms down her legs she has not had any fevers or chills or urinary symptoms.  She denies any preceding trauma.  Feels worse when she has been standing for long times.  On exam she is tender primarily over the midline sacral region abdomen is soft and nontender throughout.  Suspect that this is primarily musculoskeletal related to her pregnancy.  We discussed taking Tylenol rest and heating pad.  Performed bedside ultrasound to assess the fetus, which looks good with good fetal movement and fetal heart rate 150.  Prescribed doxylamine and pyridoxine for nausea and vomiting she is not having any ongoing vomiting currently.  Patient does report she has some mild folliculitis with 1 lesion on the left inner thigh and 1 on the left labia majora.  Does not feel like something that needs I&D or antibiotics.  Recommended local warm compresses.  Recommended follow-up with        FINAL CLINICAL IMPRESSION(S) / ED DIAGNOSES   Final diagnoses:  Acute low back pain without sciatica, unspecified back pain laterality     Rx / DC Orders   ED Discharge Orders          Ordered    pyridOXINE (VITAMIN B6) 25 MG tablet  Daily        07/07/22 1718    doxylamine, Sleep, (UNISOM) 25 MG tablet  At bedtime PRN         07/07/22 1718             Note:  This document was prepared using Dragon voice recognition software and may include unintentional dictation errors.   Rada Hay, MD 07/07/22 [phone]

## 2022-08-02 ENCOUNTER — Other Ambulatory Visit: Payer: Self-pay

## 2022-08-02 ENCOUNTER — Emergency Department
Admission: EM | Admit: 2022-08-02 | Discharge: 2022-08-02 | Disposition: A | Discharge status: Home / Self Care | Payer: Medicare Other | Attending: Emergency Medicine | Admitting: Emergency Medicine

## 2022-08-02 DIAGNOSIS — R339 Retention of urine, unspecified: Secondary | ICD-10-CM | POA: Insufficient documentation

## 2022-08-02 DIAGNOSIS — O99712 Diseases of the skin and subcutaneous tissue complicating pregnancy, second trimester: Secondary | ICD-10-CM | POA: Diagnosis not present

## 2022-08-02 DIAGNOSIS — O26892 Other specified pregnancy related conditions, second trimester: Secondary | ICD-10-CM | POA: Insufficient documentation

## 2022-08-02 DIAGNOSIS — L02415 Cutaneous abscess of right lower limb: Secondary | ICD-10-CM | POA: Diagnosis not present

## 2022-08-02 DIAGNOSIS — Z3A18 18 weeks gestation of pregnancy: Secondary | ICD-10-CM | POA: Insufficient documentation

## 2022-08-02 DIAGNOSIS — O99612 Diseases of the digestive system complicating pregnancy, second trimester: Secondary | ICD-10-CM | POA: Insufficient documentation

## 2022-08-02 DIAGNOSIS — K5909 Other constipation: Secondary | ICD-10-CM | POA: Diagnosis not present

## 2022-08-02 LAB — COMPREHENSIVE METABOLIC PANEL
ALT: 10 U/L (ref 0–44)
AST: 14 U/L — ABNORMAL LOW (ref 15–41)
Albumin: 3.3 g/dL — ABNORMAL LOW (ref 3.5–5.0)
Alkaline Phosphatase: 43 U/L (ref 38–126)
Anion gap: 8 (ref 5–15)
BUN: 15 mg/dL (ref 6–20)
CO2: 22 mmol/L (ref 22–32)
Calcium: 9.3 mg/dL (ref 8.9–10.3)
Chloride: 108 mmol/L (ref 98–111)
Creatinine, Ser: 0.67 mg/dL (ref 0.44–1.00)
GFR, Estimated: >60 mL/min (ref >60)
Glucose, Bld: 110 mg/dL — ABNORMAL HIGH (ref 70–99)
Potassium: 3.6 mmol/L (ref 3.5–5.1)
Sodium: 138 mmol/L (ref 135–145)
Total Bilirubin: 0.6 mg/dL (ref 0.3–1.2)
Total Protein: 7.1 g/dL (ref 6.5–8.1)

## 2022-08-02 LAB — CBC
HCT: 29 % — ABNORMAL LOW (ref 36.0–46.0)
Hemoglobin: 9.8 g/dL — ABNORMAL LOW (ref 12.0–15.0)
MCH: 26.3 pg (ref 26.0–34.0)
MCHC: 33.8 g/dL (ref 30.0–36.0)
MCV: 78 fL — ABNORMAL LOW (ref 80.0–100.0)
Platelets: 283 10*3/uL (ref 150–400)
RBC: 3.72 MIL/uL — ABNORMAL LOW (ref 3.87–5.11)
RDW: 13.5 % (ref 11.5–15.5)
WBC: 10.7 10*3/uL — ABNORMAL HIGH (ref 4.0–10.5)
nRBC: 0 % (ref 0.0–0.2)

## 2022-08-02 LAB — LIPASE, BLOOD: Lipase: 32 U/L (ref 11–51)

## 2022-08-02 MED ORDER — DOCUSATE SODIUM 100 MG PO CAPS: 100.0000 mg | ORAL_CAPSULE | Freq: Every day | ORAL | 0 refills | Status: DC | PRN

## 2022-08-02 MED ORDER — CLINDAMYCIN HCL 300 MG PO CAPS: 300.0000 mg | ORAL_CAPSULE | Freq: Three times a day (TID) | ORAL | 0 refills | Status: AC

## 2022-08-02 MED ORDER — CLINDAMYCIN HCL 150 MG PO CAPS
300.0000 mg | ORAL_CAPSULE | Freq: Once | ORAL | Status: AC
  Administered 2022-08-02: 300 mg via ORAL
  Filled 2022-08-02: qty 2

## 2022-08-02 NOTE — ED Provider Notes (Signed)
[facility] Provider Note    Event Date/Time   First MD Initiated Contact with Patient 08/02/22 1809     (approximate)   History   Urinary Retention and Constipation   HPI  [name] is a 31 y.o. female with no significant past medical history presents to the emergency department for treatment and evaluation of pain on both upper thighs and constipation.  She is approximately [redacted] weeks pregnant and is taking prenatal vitamins.  Last bowel movement was last week.  She states that because of the pain in her upper thighs she has pain with straining to have a bowel movement.  She denies vaginal bleeding, discharge, or abdominal cramping.  She has no dysuria or urinary frequency.     Physical Exam   Triage Vital Signs: ED Triage Vitals  Enc Vitals Group     BP 08/02/22 1657 113/73     Pulse Rate 08/02/22 1657 84     Resp 08/02/22 1657 18     Temp 08/02/22 1657 98.4 F (36.9 C)     Temp src --      SpO2 08/02/22 1657 96 %     Weight 08/02/22 1700 193 lb (87.5 kg)     Height 08/02/22 1700 5' 4.75" (1.645 m)     Head Circumference --      Peak Flow --      Pain Score 08/02/22 1700 5     Pain Loc --      Pain Edu? --      Excl. in GC? --     Most recent vital signs: Vitals:   08/02/22 1852 08/02/22 1911  BP: 115/73 117/73  Pulse: 81 86  Resp: 16 16  Temp:    SpO2: 95% 97%     General: Awake, no distress.  CV:  Good peripheral perfusion.  Resp:  Normal effort.  Abd:  No distention. No abdominal tenderness. Other:  2 indurated areas on either side of upper, inner thighs below labia. Surrounding erythema on the right side without fluctuance/pointing.   ED Results / Procedures / Treatments   Labs (all labs ordered are listed, but only abnormal results are displayed) Labs Reviewed  COMPREHENSIVE METABOLIC PANEL - Abnormal; Notable for the following components:      Result Value   Glucose, Bld 110 (*)    Albumin 3.3 (*)    AST 14 (*)     All other components within normal limits  CBC - Abnormal; Notable for the following components:   WBC 10.7 (*)    RBC 3.72 (*)    Hemoglobin 9.8 (*)    HCT 29.0 (*)    MCV 78.0 (*)    All other components within normal limits  LIPASE, BLOOD     EKG     RADIOLOGY    PROCEDURES:  Critical Care performed: No  Procedures   MEDICATIONS ORDERED IN ED: Medications  clindamycin (CLEOCIN) capsule 300 mg (300 mg Oral Given 08/02/22 1849)     IMPRESSION / MDM / ASSESSMENT AND PLAN / ED COURSE  I reviewed the triage vital signs and the nursing notes.                              Differential diagnosis includes, but is not limited to,   Patient's presentation is most consistent with acute complicated illness / injury requiring diagnostic workup.  31 year old female presenting to the emergency  department for treatment and evaluation of constipation and tender areas in her upper thighs.  There was apparently some concern in triage as it was indicated she was here for urinary retention.  She was actually stating that she could not "feces" to described her current complaint. To me, she denies any difficulty urinating and has no complaint of dysuria or frequency.  On exam, she has 2 small areas of induration on her inner thighs. There is no pointing/fluctuance or obvious drainable abscess but she is quite tender especially on the right side.  Plan will be to treat her with antibiotics.  Clindamycin chosen since she is pregnant and there is likely a component of MRSA.  She will be encouraged to use docusate to help with constipation.  Primary care/GYN follow-up encouraged and ER return precautions discussed.     FINAL CLINICAL IMPRESSION(S) / ED DIAGNOSES   Final diagnoses:  Other constipation  Abscess of right thigh     Rx / DC Orders   ED Discharge Orders          Ordered    clindamycin (CLEOCIN) 300 MG capsule  3 times daily        08/02/22 1848    docusate  sodium (COLACE) 100 MG capsule  Daily PRN        08/02/22 1848             Note:  This document was prepared using Dragon voice recognition software and may include unintentional dictation errors.   Chinita Pester, FNP 08/02/22 2242    Pilar Jarvis, MD 08/03/22 2123

## 2022-08-02 NOTE — ED Triage Notes (Signed)
Pt to ED from home with difficulty urinating and last BM sometime last week. Pt takes prenatal vitamins as ordered.  Pt has not taken any OTC meds and has not called her OBGYN. Pt is currently [redacted] weeks pregnant. Next OB apt in December. Pt is CAOx4 and in no acute distress at this time.

## 2022-08-02 NOTE — Discharge Instructions (Signed)
Please take the antibiotic as prescribed and until finished.  Follow-up with your primary care provider if not improving over the next few days.  Return to the emergency department for symptoms that change or worsen if you are unable to schedule an appointment.

## 2022-08-02 NOTE — ED Notes (Signed)
Patient's mother, Felecia Jan, was contacted and is almost here. She is aware that she needs to come in to the room for discharge.

## 2022-09-27 ENCOUNTER — Other Ambulatory Visit: Payer: Self-pay | Admitting: Obstetrics and Gynecology

## 2022-10-01 ENCOUNTER — Ambulatory Visit
Admission: RE | Admit: 2022-10-01 | Discharge: 2022-10-01 | Disposition: A | Discharge status: Home / Self Care | Payer: Medicare Other | Source: Ambulatory Visit | Attending: Obstetrics and Gynecology | Admitting: Obstetrics and Gynecology

## 2022-10-01 DIAGNOSIS — Z3A00 Weeks of gestation of pregnancy not specified: Secondary | ICD-10-CM | POA: Insufficient documentation

## 2022-10-01 DIAGNOSIS — D649 Anemia, unspecified: Secondary | ICD-10-CM | POA: Diagnosis not present

## 2022-10-01 DIAGNOSIS — O99019 Anemia complicating pregnancy, unspecified trimester: Secondary | ICD-10-CM | POA: Insufficient documentation

## 2022-10-01 MED ORDER — SODIUM CHLORIDE 0.9 % IV SOLN
300.0000 mg | INTRAVENOUS | Status: DC
  Administered 2022-10-01: 300 mg via INTRAVENOUS
  Filled 2022-10-01: qty 300

## 2022-10-08 ENCOUNTER — Ambulatory Visit
Admission: RE | Admit: 2022-10-08 | Discharge: 2022-10-08 | Disposition: A | Discharge status: Home / Self Care | Payer: Medicare Other | Source: Ambulatory Visit | Attending: Obstetrics and Gynecology | Admitting: Obstetrics and Gynecology

## 2022-10-08 DIAGNOSIS — D649 Anemia, unspecified: Secondary | ICD-10-CM | POA: Insufficient documentation

## 2022-10-08 DIAGNOSIS — O99019 Anemia complicating pregnancy, unspecified trimester: Secondary | ICD-10-CM | POA: Insufficient documentation

## 2022-10-08 DIAGNOSIS — Z3A00 Weeks of gestation of pregnancy not specified: Secondary | ICD-10-CM | POA: Insufficient documentation

## 2022-10-08 MED ORDER — SODIUM CHLORIDE 0.9 % IV SOLN
300.0000 mg | INTRAVENOUS | Status: DC
  Administered 2022-10-08: 300 mg via INTRAVENOUS
  Filled 2022-10-08: qty 300

## 2022-10-08 MED ORDER — SODIUM CHLORIDE (PF) 0.9 % IJ SOLN
INTRAMUSCULAR | Status: AC
  Filled 2022-10-08: qty 10

## 2022-10-15 ENCOUNTER — Ambulatory Visit
Admission: RE | Admit: 2022-10-15 | Discharge: 2022-10-15 | Disposition: A | Discharge status: Home / Self Care | Payer: Medicare Other | Source: Ambulatory Visit | Attending: Obstetrics and Gynecology | Admitting: Obstetrics and Gynecology

## 2022-10-15 DIAGNOSIS — O99019 Anemia complicating pregnancy, unspecified trimester: Secondary | ICD-10-CM | POA: Insufficient documentation

## 2022-10-15 DIAGNOSIS — Z3A00 Weeks of gestation of pregnancy not specified: Secondary | ICD-10-CM | POA: Diagnosis not present

## 2022-10-15 MED ORDER — SODIUM CHLORIDE 0.9 % IV SOLN
300.0000 mg | Freq: Once | INTRAVENOUS | Status: AC
  Administered 2022-10-15: 300 mg via INTRAVENOUS
  Filled 2022-10-15: qty 300

## 2022-10-21 NOTE — Addendum Note (Signed)
Encounter addended by: Kathyrn Drown, RN on: 10/21/2022 8:34 AM  Actions taken: Charge Capture section accepted

## 2022-11-08 ENCOUNTER — Encounter: Payer: Self-pay | Admitting: Obstetrics and Gynecology

## 2022-11-08 ENCOUNTER — Other Ambulatory Visit: Payer: Self-pay

## 2022-11-08 ENCOUNTER — Observation Stay
Admission: EM | Admit: 2022-11-08 | Discharge: 2022-11-08 | Disposition: A | Discharge status: Home / Self Care | Payer: Medicare Other | Attending: Obstetrics and Gynecology | Admitting: Obstetrics and Gynecology

## 2022-11-08 DIAGNOSIS — O26893 Other specified pregnancy related conditions, third trimester: Secondary | ICD-10-CM | POA: Diagnosis not present

## 2022-11-08 DIAGNOSIS — R1084 Generalized abdominal pain: Secondary | ICD-10-CM | POA: Diagnosis not present

## 2022-11-08 DIAGNOSIS — Z3A32 32 weeks gestation of pregnancy: Secondary | ICD-10-CM | POA: Diagnosis not present

## 2022-11-08 DIAGNOSIS — Z79899 Other long term (current) drug therapy: Secondary | ICD-10-CM | POA: Diagnosis not present

## 2022-11-08 DIAGNOSIS — R102 Pelvic and perineal pain: Secondary | ICD-10-CM | POA: Diagnosis not present

## 2022-11-08 LAB — WET PREP, GENITAL
Clue Cells Wet Prep HPF POC: NONE SEEN
Sperm: NONE SEEN
Trich, Wet Prep: NONE SEEN
WBC, Wet Prep HPF POC: <10 (ref <10)
Yeast Wet Prep HPF POC: NONE SEEN

## 2022-11-08 LAB — URINALYSIS, COMPLETE (UACMP) WITH MICROSCOPIC
Bilirubin Urine: NEGATIVE
Glucose, UA: NEGATIVE mg/dL
Hgb urine dipstick: NEGATIVE
Ketones, ur: NEGATIVE mg/dL
Leukocytes,Ua: NEGATIVE
Nitrite: NEGATIVE
Protein, ur: 30 mg/dL — AB
Specific Gravity, Urine: 1.025 (ref 1.005–1.030)
pH: 5 (ref 5.0–8.0)

## 2022-11-08 LAB — CHLAMYDIA/NGC RT PCR (ARMC ONLY)
Chlamydia Tr: NOT DETECTED
N gonorrhoeae: NOT DETECTED

## 2022-11-08 MED ORDER — LACTATED RINGERS IV SOLN: 125.0000 mL/h | INTRAVENOUS | Status: DC

## 2022-11-08 MED ORDER — ACETAMINOPHEN 325 MG PO TABS: 650.0000 mg | ORAL_TABLET | ORAL | Status: DC | PRN

## 2022-11-08 MED ORDER — CALCIUM CARBONATE ANTACID 500 MG PO CHEW: 2.0000 | CHEWABLE_TABLET | ORAL | Status: DC | PRN

## 2022-11-08 MED ORDER — PRENATAL MULTIVITAMIN CH: 1.0000 | ORAL_TABLET | Freq: Every day | ORAL | Status: DC

## 2022-11-08 MED ORDER — DOCUSATE SODIUM 100 MG PO CAPS: 100.0000 mg | ORAL_CAPSULE | Freq: Every day | ORAL | Status: DC

## 2022-11-08 MED ORDER — ZOLPIDEM TARTRATE 5 MG PO TABS: 5.0000 mg | ORAL_TABLET | Freq: Every evening | ORAL | Status: DC | PRN

## 2022-11-08 NOTE — OB Triage Note (Signed)
Pt being discharged by McVey, CNM. Pt education performed and pt understands pain management and when to return to see [place] for lofm, ctx's, vaginal bleeding, lof. Pt will see her provider next week.

## 2022-11-08 NOTE — Discharge Summary (Signed)
[name] is a 32 y.o. female. She is at 35w2dgestation. Patient's last menstrual period was 03/27/2022 (within days). 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  [name] DSS appointed guardian. Phone: [phone] number, Cell number is [phone] Previous CS x 2 Iron deficiency anemia; s/p iron infusions Obesity, BMI 32.7 pre-preg  Chief complaint: Abd pain unrelieved by tylenol, reports that nothing helps or worsens the pain. Denies LOF, VB or contractions, just has generalized abdominal pain present since before her prenatal appt yesterday.   S: Resting comfortably. no CTX, no VB.no LOF,  Active fetal movement. Denies: HA, visual changes, SOB, or RUQ/epigastric pain  Maternal Medical History:   Past Medical History:  Diagnosis Date   Depression    Herpes labialis    Mild mental retardation    PONV (postoperative nausea and vomiting)     Past Surgical History:  Procedure Laterality Date   CESAREAN SECTION  10/19/2014   CESAREAN SECTION N/A 09/12/2021   Procedure: REPEAT CESAREAN SECTION;  Surgeon: SOuida Sills TGwen Her MD;  Location: [facility] ORS;  Service: Obstetrics;  Laterality: N/A;    No Known Allergies  Prior to Admission medications   Medication Sig Start Date End Date Taking? Authorizing Provider  acetaminophen (TYLENOL) 500 MG tablet Take 2 tablets (1,000 mg total) by mouth every 6 (six) hours as needed for mild pain (surgery). 09/14/21   MMinda Meo CNM  docusate sodium (COLACE) 100 MG capsule Take 1 capsule (100 mg total) by mouth daily as needed. 08/02/22 08/02/23  Triplett, CDessa Phi FNP  doxylamine, Sleep, (UNISOM) 25 MG tablet Take 1 tablet (25 mg total) by mouth at bedtime as needed. 07/07/22   MRada Hay MD  ferrous sulfate 325 (65 FE) MG tablet  Take 325 mg by mouth daily with breakfast. Patient not taking: Reported on 05/07/2022    [provider]  hydrochlorothiazide (HYDRODIURIL) 25 MG tablet Take 1 tablet (25 mg total) by mouth daily. Patient not taking: Reported on 05/07/2022 04/15/22   FVersie Starks PA-C  ibuprofen (ADVIL) 600 MG tablet Take 1 tablet (600 mg total) by mouth every 6 (six) hours as needed for mild pain or cramping. Patient not taking: Reported on 05/07/2022 09/14/21   MMinda Meo CNM  Prenatal Vit-Fe Fumarate-FA (PRENATAL MULTIVITAMIN) TABS tablet Take 1 tablet by mouth daily at 12 noon. Patient not taking: Reported on 05/07/2022    [provider]  fluticasone (FLONASE) 50 MCG/ACT nasal spray Place 2 sprays into both nostrils daily. 08/22/18 12/19/19  Menshew, JDannielle Karvonen PA-C  omeprazole (PRILOSEC OTC) 20 MG tablet Take 1 tablet (20 mg total) by mouth daily. 02/10/21 02/10/21  JBlake Divine MD      Social History: She  reports that she has never smoked. She has never used smokeless tobacco. She reports that she does not currently use alcohol. She reports that she does not use drugs.  Family History:  no history of gyn cancers  Review of Systems: A full review of systems was performed and negative except as noted in the HPI.     O:  LMP 03/27/2022 (Within Days)  Results for orders placed or performed during the hospital encounter of 11/08/22 (from the past 48 hour(s))  Wet prep, genital   Collection Time: 11/08/22  3:44 PM   Specimen:  Urine, Clean Catch  Result Value Ref Range   Yeast Wet Prep HPF POC NONE SEEN NONE SEEN   Trich, Wet Prep NONE SEEN NONE SEEN   Clue Cells Wet Prep HPF POC NONE SEEN NONE SEEN   WBC, Wet Prep HPF POC <10 <10   Sperm NONE SEEN   Urinalysis, Complete w Microscopic -Urine, Clean Catch   Collection Time: 11/08/22  3:44 PM  Result Value Ref Range   Color, Urine YELLOW (A) YELLOW   APPearance HAZY (A) CLEAR   Specific Gravity, Urine 1.025 1.005 - 1.030    pH 5.0 5.0 - 8.0   Glucose, UA NEGATIVE NEGATIVE mg/dL   Hgb urine dipstick NEGATIVE NEGATIVE   Bilirubin Urine NEGATIVE NEGATIVE   Ketones, ur NEGATIVE NEGATIVE mg/dL   Protein, ur 30 (A) NEGATIVE mg/dL   Nitrite NEGATIVE NEGATIVE   Leukocytes,Ua NEGATIVE NEGATIVE   RBC / HPF 0-5 0 - 5 RBC/hpf   WBC, UA 0-5 0 - 5 WBC/hpf   Bacteria, UA RARE (A) NONE SEEN   Squamous Epithelial / HPF 11-20 0 - 5 /HPF   Mucus PRESENT      Constitutional: NAD, AAOx3  HE/ENT: extraocular movements grossly intact, moist mucous membranes CV: RRR PULM: nl respiratory effort, CTABL     Abd: gravid, non-tender, non-distended, soft      Ext: Non-tender, Nonedematous   Psych: mood appropriate, speech normal Pelvic: deferred   Fetal  monitoring: Cat I Appropriate for GA Baseline: 135bpm Variability: moderate Accelerations: present x >2 Decelerations absent  TOCO: no UCs noted.     A/P: 32 y.o. 60w2dhere for antenatal surveillance for abdominal pain  Principle Diagnosis:  Ligament pain; 32wks  Labor: not present.  Fetal Wellbeing: Reassuring Cat 1 tracing; Reactive NST  Recommended support belt, discussed ligament pain.  D/c home stable, precautions reviewed, follow-up as scheduled.    RFrancetta Found CNM 11/08/2022  4:39 PM

## 2022-11-08 NOTE — OB Triage Note (Signed)
[name] presents to l&d triage at 51w2dc/o "abdominal pain". She reports the pain is in her lower abdomen and radiates to the low back. She denies ctx's, lofm, vaginal bleeding, and rom. She reports the pain began yesterday and nothing improves nor worsens the pain. She denies nausea/vomiting/diarrhea and respiratory symptoms. Labs collected, nst performed and strip is reactive.

## 2022-12-03 NOTE — H&P (Signed)
[name] is a 32 y.o. female presenting for repeat LTCS on 12/25/22 32 y.o. G4P2002 at  Patient's last menstrual period was 03/27/2022 (exact date). consistent with  ultrasound  @ [redacted]w[redacted]d .Estimated Date of Delivery: 01/01/23 Sex of baby and name:  girl "[name]"   Partner:     Factors complicating this pregnancy  Intellectual disability Pt declared incompetent by the court 03/2013 Pt cannot sign any consents legally.  [name] is DSS appointed guardian. Phone: [phone] office number And Cell number is [phone]  Growth and Abdominal Circumference  08/26/22  EFW 32%ile AC 14%ile 09/25/22  EFW 45%ile AC 24%ile 10/31/22  EFW 57%ile AC 50%ile Polyhydramnios - Resolved  09/25/22 AFI 24.18 10/31/22 AFI 15.31 11/07/22 AFI 15.35 by BSUS 11/14/2022 AFI 12.46 cm  Weekly NST  Anemia in First Trimester Start once daily iron (06/28/22) Ferritin:26 Weekly iron infusions x 3 starting on 10/01/22 Previous C/S x_2_ Delivery preference:__TJS on 12/25/22, no BTL _ Obesity BMI:32.73 Baseline labs: Early 1 hour GTT:85 H/o mental health diagnoses Dx: Depression Current meds: none   Screening results and needs: NOB:  Medicaid Questionnaire: completed 06/26/22 []  ACHD Program Depression Score:8 MBT:O positive          Ab screen:Neg    HIV: Neg      RPR: NR   Hep B:Neg       Hep C: NR Pap:6/22 neg/neg   G/C: Neg/neg Rubella:Immune         VZV: Immune TSH:1.091    HgA1C: 5.4 Aneuploidy:  First trimester:  MaternitT21: Negative /Female Second trimester (AFP/tetra): negative 07/24/22 28 weeks:  Review Medicaid Questionnaire: []  ACHD Program Depression Score: 1 Blood consent: Hgb:9.5   Platelets:  279  Glucola:  98 Rhogam:n/a  36 weeks:  GBS:   G/C:   Hgb:  Platelets:    HIV: RPR:    Last [place]:  06/10/22: Uterus anteverted single, viable IUP, S=[redacted]w[redacted]d. YS and amnion seen. FHR=175 BPM. Cervical length=5.91 cm. Rt Septated complex corpus luteum cyst=2.7 cm; septation=0.13 cm. lt ovary  within normal limits. No free fluid seen. 08/26/2022: Normal anatomy seen, Single, viable IUP, S=[redacted]w[redacted]d, FHR=152bpm, Cervical length=4.03cm, Placenta=posterior, Position = breech, B/L ovaries appear normal 09/25/22: transverse, post plac, EFW- 834 g @ 45%, AFI- 24.18 cm @97 %,  10/31/22: EFW= 3lb13oz(1722g)= 57%, FHR=156bpm, AFI= 15.31cm @ 54%, Placenta=posterior, Position=vertex, spine  rt Immunization:   Flu in season -received 07/24/22 bl Tdap at 27-36 weeks - Given 10/09/22 TD Covid-19 -  RSV at 32-36 weeks -  Contraception Plan: depo Feeding Plan:  Labor Plans:       OB History     Gravida  3   Para  2   Term  2   Preterm  0   AB  0   Living  2      SAB  0   IAB  0   Ectopic  0   Multiple      Live Births  2          Past Medical History:  Diagnosis Date   Depression    Herpes labialis    Mild mental retardation    PONV (postoperative nausea and vomiting)    Past Surgical History:  Procedure Laterality Date   CESAREAN SECTION  10/19/2014   CESAREAN SECTION N/A 09/12/2021   Procedure: REPEAT CESAREAN SECTION;  Surgeon: Ouida Sills, Shelley Her, MD;  Location: [facility] ORS;  Service: Obstetrics;  Laterality: N/A;   Family History: family history is not on file.  Social History:  reports that she has never smoked. She has never used smokeless tobacco. She reports that she does not currently use alcohol. She reports that she does not use drugs.     Maternal Diabetes: No Genetic Screening: Normal Maternal Ultrasounds/Referrals: Normal Fetal Ultrasounds or other Referrals:  None Maternal Substance Abuse:  No Significant Maternal Medications:  None Significant Maternal Lab Results:  None Number of Prenatal Visits:greater than 3 verified prenatal visits Other Comments:  None  Review of Systems History   Last menstrual period 03/27/2022, not currently breastfeeding. Exam3/20/2024 Physical Exam   Lungs CTA   Cv RRR   Abd : gravid  Prenatal  labs: ABO, Rh:  O+ Antibody:  neg  Rubella:  Imm , VZ IMM RPR:   Nr  HBsAg:   neg , Hep C Neg  HIV:   neg  GBS:   not done   Assessment/Plan: Elective repeat LTCS 39+0 weeks  The risks of cesarean section discussed with the patient included but were not limited to: bleeding which may require transfusion or reoperation; infection which may require antibiotics; injury to bowel, bladder, ureters or other surrounding organs; injury to the fetus; need for additional procedures including hysterectomy in the event of a life-threatening hemorrhage; placental abnormalities wth subsequent pregnancies, incisional problems, thromboembolic phenomenon and other postoperative/anesthesia complications. The patient concurred with the proposed plan, giving informed written consent for the procedure.   Preoperative prophylactic antibiotics and SCDs ordered on call to the OR.  To OR when ready.    [name] Lenvil Swaim 12/03/2022, 5:37 PM

## 2022-12-19 ENCOUNTER — Observation Stay
Admission: EM | Admit: 2022-12-19 | Discharge: 2022-12-19 | Disposition: A | Discharge status: Home / Self Care | Payer: Medicare Other | Attending: Obstetrics | Admitting: Obstetrics

## 2022-12-19 ENCOUNTER — Other Ambulatory Visit: Payer: Self-pay

## 2022-12-19 ENCOUNTER — Encounter: Payer: Self-pay | Admitting: Obstetrics and Gynecology

## 2022-12-19 DIAGNOSIS — Z3A38 38 weeks gestation of pregnancy: Secondary | ICD-10-CM | POA: Insufficient documentation

## 2022-12-19 DIAGNOSIS — O288 Other abnormal findings on antenatal screening of mother: Secondary | ICD-10-CM | POA: Diagnosis present

## 2022-12-19 DIAGNOSIS — Z79899 Other long term (current) drug therapy: Secondary | ICD-10-CM | POA: Insufficient documentation

## 2022-12-19 DIAGNOSIS — O368330 Maternal care for abnormalities of the fetal heart rate or rhythm, third trimester, not applicable or unspecified: Secondary | ICD-10-CM | POA: Insufficient documentation

## 2022-12-19 DIAGNOSIS — O163 Unspecified maternal hypertension, third trimester: Secondary | ICD-10-CM | POA: Diagnosis not present

## 2022-12-19 LAB — PROTEIN / CREATININE RATIO, URINE
Creatinine, Urine: 62 mg/dL
Protein Creatinine Ratio: 0.11 mg/mg{Cre} (ref 0.00–0.15)
Total Protein, Urine: 7 mg/dL

## 2022-12-19 LAB — CBC
HCT: 30.2 % — ABNORMAL LOW (ref 36.0–46.0)
Hemoglobin: 10 g/dL — ABNORMAL LOW (ref 12.0–15.0)
MCH: 27.4 pg (ref 26.0–34.0)
MCHC: 33.1 g/dL (ref 30.0–36.0)
MCV: 82.7 fL (ref 80.0–100.0)
Platelets: 255 10*3/uL (ref 150–400)
RBC: 3.65 MIL/uL — ABNORMAL LOW (ref 3.87–5.11)
RDW: 13.6 % (ref 11.5–15.5)
WBC: 10.9 10*3/uL — ABNORMAL HIGH (ref 4.0–10.5)
nRBC: 0 % (ref 0.0–0.2)

## 2022-12-19 LAB — COMPREHENSIVE METABOLIC PANEL
ALT: 10 U/L (ref 0–44)
AST: 17 U/L (ref 15–41)
Albumin: 2.7 g/dL — ABNORMAL LOW (ref 3.5–5.0)
Alkaline Phosphatase: 81 U/L (ref 38–126)
Anion gap: 7 (ref 5–15)
BUN: 11 mg/dL (ref 6–20)
CO2: 20 mmol/L — ABNORMAL LOW (ref 22–32)
Calcium: 8.8 mg/dL — ABNORMAL LOW (ref 8.9–10.3)
Chloride: 109 mmol/L (ref 98–111)
Creatinine, Ser: 0.6 mg/dL (ref 0.44–1.00)
GFR, Estimated: >60 mL/min (ref >60)
Glucose, Bld: 96 mg/dL (ref 70–99)
Potassium: 3.9 mmol/L (ref 3.5–5.1)
Sodium: 136 mmol/L (ref 135–145)
Total Bilirubin: 0.6 mg/dL (ref 0.3–1.2)
Total Protein: 6.4 g/dL — ABNORMAL LOW (ref 6.5–8.1)

## 2022-12-19 MED ORDER — LABETALOL HCL 5 MG/ML IV SOLN: 40.0000 mg | INTRAVENOUS | Status: DC | PRN

## 2022-12-19 MED ORDER — LABETALOL HCL 5 MG/ML IV SOLN: 80.0000 mg | INTRAVENOUS | Status: DC | PRN

## 2022-12-19 MED ORDER — LABETALOL HCL 5 MG/ML IV SOLN: 20.0000 mg | INTRAVENOUS | Status: DC | PRN

## 2022-12-19 MED ORDER — HYDRALAZINE HCL 20 MG/ML IJ SOLN: 10.0000 mg | INTRAMUSCULAR | Status: DC | PRN

## 2022-12-19 NOTE — OB Triage Note (Signed)
Patient is a G3P2002 at [redacted]w[redacted]d who was sent over from office for non-reactive NST and PIH eval. Reports +FM, denies ctx, LOF, and vaginal bleeding. Reports feeling pressure in lower abdomen. External monitors applied and assessing. Initial FHT 135. Initial BP 128/87, cycling q40min.

## 2022-12-19 NOTE — Progress Notes (Signed)
Patient ID: [name], female   DOB: March 01, 1991, 32 y.o.   MRN: [id number] BPP done by me :   NST now reactive,         =2 Gross movt X3               =2  Amniotic fluid 43x26mm=2 Breathing+ 30 sec         =2 Did not get extesion flexion movt   Total BPP 8/10   AFI 7.2cm  A; reassuring fetal monitoring with BPP Relative low AFI  P : increase PO fluid intake Repeat AFI And NST in 2 days  Continue fetal movts daily

## 2022-12-19 NOTE — Discharge Summary (Signed)
[name] is a 32 y.o. female. She is at [redacted]w[redacted]d gestation. Patient's last menstrual period was 03/27/2022 (within days). Estimated Date of Delivery: 01/01/23  Prenatal care site: [facility]  Chief complaint: non reactive NST and elevated BP in clinic  HPI: [name] presents to L&D with complaints of elevated bp in clinic and non reactive NST  Factors complicating pregnancy: Intellectual disability Pt declared incompetent by the court 03/2013 Pt cannot sign any consents legally.  [name] is DSS appointed guardian. Phone: [phone] office number, Cell number is [phone]  Previous CS x 2 Iron deficiency anemia; s/p iron infusions Obesity, BMI 32.7 pre-preg  S: Resting comfortably. no CTX, no VB.no LOF,  Active fetal movement.   Maternal Medical History:  Past Medical Hx:  has a past medical history of Depression, Herpes labialis, Mild mental retardation, and PONV (postoperative nausea and vomiting).    Past Surgical Hx:  has a past surgical history that includes Cesarean section (10/19/2014) and Cesarean section (N/A, 09/12/2021).   No Known Allergies   Prior to Admission medications   Medication Sig Start Date End Date Taking? Authorizing Provider  acetaminophen (TYLENOL) 500 MG tablet Take 2 tablets (1,000 mg total) by mouth every 6 (six) hours as needed for mild pain (surgery). 09/14/21   Minda Meo, CNM  docusate sodium (COLACE) 100 MG capsule Take 1 capsule (100 mg total) by mouth daily as needed. 08/02/22 08/02/23  Triplett, Dessa Phi, FNP  doxylamine, Sleep, (UNISOM) 25 MG tablet Take 1 tablet (25 mg total) by mouth at bedtime as needed. 07/07/22   Rada Hay, MD  ferrous sulfate 325 (65 FE) MG tablet Take 325 mg by mouth daily with breakfast.    [provider]  hydrochlorothiazide (HYDRODIURIL) 25 MG tablet Take 1 tablet (25 mg total) by mouth daily. Patient not taking: Reported on 05/07/2022 04/15/22   Versie Starks, PA-C   ibuprofen (ADVIL) 600 MG tablet Take 1 tablet (600 mg total) by mouth every 6 (six) hours as needed for mild pain or cramping. Patient not taking: Reported on 05/07/2022 09/14/21   Minda Meo, CNM  Prenatal Vit-Fe Fumarate-FA (PRENATAL MULTIVITAMIN) TABS tablet Take 1 tablet by mouth daily at 12 noon.    [provider]  fluticasone (FLONASE) 50 MCG/ACT nasal spray Place 2 sprays into both nostrils daily. 08/22/18 12/19/19  Menshew, Dannielle Karvonen, PA-C  omeprazole (PRILOSEC OTC) 20 MG tablet Take 1 tablet (20 mg total) by mouth daily. 02/10/21 02/10/21  Blake Divine, MD    Social History: She  reports that she has never smoked. She has never used smokeless tobacco. She reports that she does not currently use alcohol. She reports that she does not use drugs.  Family History: family history is not on file. ,no history of gyn cancers  Review of Systems: A full review of systems was performed and negative except as noted in the HPI.    O:  BP 137/86   Pulse 90   Temp 98.1 F (36.7 C) (Oral)   Resp 18   Ht 5\' 4"  (1.626 m)   Wt 95.3 kg   LMP 03/27/2022 (Within Days)   BMI 36.05 kg/m  Results for orders placed or performed during the hospital encounter of 12/19/22 (from the past 48 hour(s))  Protein / creatinine ratio, urine   Collection Time: 12/19/22 12:23 PM  Result Value Ref Range   Creatinine, Urine 62 mg/dL   Total Protein, Urine 7 mg/dL   Protein  Creatinine Ratio 0.11 0.00 - 0.15 mg/mg[Cre]  CBC   Collection Time: 12/19/22 12:36 PM  Result Value Ref Range   WBC 10.9 (H) 4.0 - 10.5 K/uL   RBC 3.65 (L) 3.87 - 5.11 MIL/uL   Hemoglobin 10.0 (L) 12.0 - 15.0 g/dL   HCT 30.2 (L) 36.0 - 46.0 %   MCV 82.7 80.0 - 100.0 fL   MCH 27.4 26.0 - 34.0 pg   MCHC 33.1 30.0 - 36.0 g/dL   RDW 13.6 11.5 - 15.5 %   Platelets 255 150 - 400 K/uL   nRBC 0.0 0.0 - 0.2 %  Comprehensive metabolic panel   Collection Time: 12/19/22 12:36 PM  Result Value Ref Range   Sodium 136 135 - 145  mmol/L   Potassium 3.9 3.5 - 5.1 mmol/L   Chloride 109 98 - 111 mmol/L   CO2 20 (L) 22 - 32 mmol/L   Glucose, Bld 96 70 - 99 mg/dL   BUN 11 6 - 20 mg/dL   Creatinine, Ser 0.60 0.44 - 1.00 mg/dL   Calcium 8.8 (L) 8.9 - 10.3 mg/dL   Total Protein 6.4 (L) 6.5 - 8.1 g/dL   Albumin 2.7 (L) 3.5 - 5.0 g/dL   AST 17 15 - 41 U/L   ALT 10 0 - 44 U/L   Alkaline Phosphatase 81 38 - 126 U/L   Total Bilirubin 0.6 0.3 - 1.2 mg/dL   GFR, Estimated >60 >60 mL/min   Anion gap 7 5 - 15     Constitutional: NAD, AAOx3  HE/ENT: extraocular movements grossly intact, moist mucous membranes CV: RRR PULM: nl respiratory effort, CTABL Abd: gravid, non-tender, non-distended, soft  Ext: Non-tender, Nonedmeatous Psych: mood appropriate, speech normal Pelvic : deferred SVE:     Fetal Monitor: Baseline: 135 bpm Variability: moderate with periods of minimal Accels: Present Decels: none Toco: irritability  Category: II   Assessment: 32 y.o. [redacted]w[redacted]d here for antenatal surveillance during pregnancy. Dr Ouida Sills notified of [place] and at bedside to perform a BPP 8/10  Principle diagnosis:  There were no encounter diagnoses.   Plan: Labor: not present.  Fetal Wellbeing: Reassuring Cat 1 tracing. Reactive NST  Pre labs wnl D/c home stable, precautions reviewed, follow-up as scheduled.   ----- Avelino Leeds, CNM Certified Nurse Midwife [facility]

## 2022-12-19 NOTE — OB Triage Note (Signed)
Discharge instructions, labor precautions, and follow-up care reviewed with patient. All questions answered. Patient verbalized understanding. Discharged ambulatory off unit.   

## 2022-12-20 ENCOUNTER — Encounter
Admission: RE | Admit: 2022-12-20 | Discharge: 2022-12-20 | Disposition: A | Discharge status: Home / Self Care | Payer: Medicare Other | Source: Ambulatory Visit | Attending: Obstetrics and Gynecology | Admitting: Obstetrics and Gynecology

## 2022-12-20 ENCOUNTER — Other Ambulatory Visit: Payer: Self-pay

## 2022-12-20 DIAGNOSIS — Z01812 Encounter for preprocedural laboratory examination: Secondary | ICD-10-CM

## 2022-12-20 HISTORY — DX: Anemia, unspecified: D64.9

## 2022-12-20 NOTE — Pre-Procedure Instructions (Signed)
Detailed message left for [name] Dss SW Adult services for [facility]. PAT call made with patient today for her scheduled c section scheduled for 12/25/22. Consent for procedure has been signed by [name] and she is aware of procedure and process.

## 2022-12-20 NOTE — Patient Instructions (Addendum)
Your procedure is scheduled on: 12/25/22 - Wednesday  Arrival Time: Please call Labor and Delivery the day before your scheduled C-Section to find out your arrival time. [phone].  Arrival: If your arrival time is prior to 6:00 am, please enter through the Emergency Room Entrance and you will be directed to Labor and Delivery. If your arrival time is 6:00 am or later, please enter the Medical Mall and follow the greeter's instructions.  REMEMBER: Instructions that are not followed completely may result in serious medical risk, up to and including death; or upon the discretion of your surgeon and anesthesiologist your surgery may need to be rescheduled.  Do not eat food after midnight the night before surgery.  No gum chewing or hard candies.  You may however, drink CLEAR liquids up to 2 hours before you are scheduled to arrive for your surgery. Do not drink anything within 2 hours of your scheduled arrival time.  Clear liquids include: - water  - apple juice without pulp - gatorade (not RED colors) - black coffee or tea (Do NOT add milk or creamers to the coffee or tea) Do NOT drink anything that is not on this list.  One week prior to surgery: Stop Anti-inflammatories (NSAIDS) such as Advil, Aleve, Ibuprofen, Motrin, Naproxen, Naprosyn and Aspirin based products such as Excedrin, Goody's Powder, BC Powder.  Stop ANY OVER THE COUNTER supplements until after surgery.  You may however, continue to take Tylenol if needed for pain up until the day of surgery.   TAKE ONLY THESE MEDICATIONS THE MORNING OF SURGERY WITH A SIP OF WATER:  NONE  No Alcohol for 24 hours before or after surgery.  No Smoking including e-cigarettes for 24 hours prior to surgery.  No chewable tobacco products for at least 6 hours prior to surgery.  No nicotine patches on the day of surgery.  Do not use any "recreational" drugs for at least a week prior to your surgery.  Please be advised that the  combination of cocaine and anesthesia may have negative outcomes, up to and including death. If you test positive for cocaine, your surgery will be cancelled.  On the morning of surgery brush your teeth with toothpaste and water, you may rinse your mouth with mouthwash if you wish. Do not swallow any toothpaste or mouthwash.  Use CHG wipes as directed on instruction sheet.  Do not wear jewelry, make-up, hairpins, clips or nail polish.  Do not wear lotions, powders, or perfumes.   Do not shave body hair from the neck down 48 hours before surgery.  Contact lenses, hearing aids and dentures may not be worn into surgery.  Do not bring valuables to the hospital. [facility] is not responsible for any missing/lost belongings or valuables.   Notify your doctor if there is any change in your medical condition (cold, fever, infection).  Wear comfortable clothing (specific to your surgery type) to the hospital.  After surgery, you can help prevent lung complications by doing breathing exercises.  Take deep breaths and cough every 1-2 hours. Your doctor may order a device called an Incentive Spirometer to help you take deep breaths. When coughing or sneezing, hold a pillow firmly against your incision with both hands. This is called "splinting." Doing this helps protect your incision. It also decreases belly discomfort.  Please call the Pre-admissions Testing Dept. at [phone] if you have any questions about these instructions.  Surgery Visitation Policy:  Visitor Passes   All visitors, including children, need  an identification sticker when visiting. These stickers must be worn where they can be seen.   Labor & Delivery  Laboring women may have one designated support person and two other visitors of any age visit. The support person must remain the same. The visitors may switch with other visitors. Visitation is permitted 24 hours per day. The designated support person or a  visitor over the age of 16 may sleep overnight in the patient's room. A doula registered with [facility] for labor and delivery support is not considered a visitor. Doulas not registered with [facility] are considered visitors.  Mother Baby Unit, OB Specialty and Gynecological Care  A designated support person and three visitors of any age may visit. The three visitors may switch out. The designated support person or a visitor age 33 or older may stay overnight in the room. During the postpartum period (up to 6 weeks), if the mother is the patient, she can have her newborn stay with her if there is another support person present who can be responsible for the baby.  Your procedure is scheduled on: Report to the Registration Desk on the 1st floor of the Medical Mall. To find out your arrival time, please call [phone] between 1PM - 3PM on: If your arrival time is 6:00 am, do not arrive before that time as the Medical Mall entrance doors do not open until 6:00 am.  REMEMBER: Instructions that are not followed completely may result in serious medical risk, up to and including death; or upon the discretion of your surgeon and anesthesiologist your surgery may need to be rescheduled.  Do not eat food after midnight the night before surgery.  No gum chewing or hard candies.  You may however, drink CLEAR liquids up to 2 hours before you are scheduled to arrive for your surgery. Do not drink anything within 2 hours of your scheduled arrival time.  Clear liquids include: - water  - apple juice without pulp - gatorade (not RED colors) - black coffee or tea (Do NOT add milk or creamers to the coffee or tea) Do NOT drink anything that is not on this list.  Type 1 and Type 2 diabetics should only drink water.  In addition, your doctor has ordered for you to drink the provided:  Ensure Pre-Surgery Clear Carbohydrate Drink  Gatorade G2 Drinking this carbohydrate drink up to two hours before  surgery helps to reduce insulin resistance and improve patient outcomes. Please complete drinking 2 hours before scheduled arrival time.  One week prior to surgery: Stop Anti-inflammatories (NSAIDS) such as Advil, Aleve, Ibuprofen, Motrin, Naproxen, Naprosyn and Aspirin based products such as Excedrin, Goody's Powder, BC Powder. Stop ANY OVER THE COUNTER supplements until after surgery. You may however, continue to take Tylenol if needed for pain up until the day of surgery.  Continue taking all prescribed medications with the exception of the following:  **Follow guidelines for insulin and diabetes medications**  Follow recommendations from Cardiologist or PCP regarding stopping blood thinners.  TAKE ONLY THESE MEDICATIONS THE MORNING OF SURGERY WITH A SIP OF WATER:    Antacid (take one the night before and one on the morning of surgery - helps to prevent nausea after surgery.)  Use inhalers on the day of surgery and bring to the hospital.  Fleets enema or bowel prep as directed.  No Alcohol for 24 hours before or after surgery.  No Smoking including e-cigarettes for 24 hours before surgery.  No chewable tobacco  products for at least 6 hours before surgery.  No nicotine patches on the day of surgery.  Do not use any "recreational" drugs for at least a week (preferably 2 weeks) before your surgery.  Please be advised that the combination of cocaine and anesthesia may have negative outcomes, up to and including death. If you test positive for cocaine, your surgery will be cancelled.  On the morning of surgery brush your teeth with toothpaste and water, you may rinse your mouth with mouthwash if you wish. Do not swallow any toothpaste or mouthwash.  Use CHG Soap or wipes as directed on instruction sheet.  Do not wear jewelry, make-up, hairpins, clips or nail polish.  Do not wear lotions, powders, or perfumes.   Do not shave body hair from the neck down 48 hours before  surgery.  Contact lenses, hearing aids and dentures may not be worn into surgery.  Do not bring valuables to the hospital. [facility] is not responsible for any missing/lost belongings or valuables.   Total Shoulder Arthroplasty:  use Benzoyl Peroxide 5% Gel as directed on instruction sheet.  Bring your C-PAP to the hospital in case you may have to spend the night.   Notify your doctor if there is any change in your medical condition (cold, fever, infection).  Wear comfortable clothing (specific to your surgery type) to the hospital.  After surgery, you can help prevent lung complications by doing breathing exercises.  Take deep breaths and cough every 1-2 hours. Your doctor may order a device called an Incentive Spirometer to help you take deep breaths. When coughing or sneezing, hold a pillow firmly against your incision with both hands. This is called "splinting." Doing this helps protect your incision. It also decreases belly discomfort.  If you are being admitted to the hospital overnight, leave your suitcase in the car. After surgery it may be brought to your room.  In case of increased patient census, it may be necessary for you, the patient, to continue your postoperative care in the Same Day Surgery department.  If you are being discharged the day of surgery, you will not be allowed to drive home. You will need a responsible individual to drive you home and stay with you for 24 hours after surgery.   If you are taking public transportation, you will need to have a responsible individual with you.  Please call the Pre-admissions Testing Dept. at [phone] if you have any questions about these instructions.  Surgery Visitation Policy:  Patients having surgery or a procedure may have two visitors.  Children under the age of 55 must have an adult with them who is not the patient.  Inpatient Visitation:    Visiting hours are 7 a.m. to 8 p.m. Up to four visitors are  allowed at one time in a patient room. The visitors may rotate out with other people during the day.  One visitor age 76 or older may stay with the patient overnight and must be in the room by 8 p.m.   Preparing for Surgery with CHLORHEXIDINE GLUCONATE (CHG) Soap  Chlorhexidine Gluconate (CHG) Soap  o An antiseptic cleaner that kills germs and bonds with the skin to continue killing germs even after washing  o Used for showering the night before surgery and morning of surgery  Before surgery, you can play an important role by reducing the number of germs on your skin.  CHG (Chlorhexidine gluconate) soap is an antiseptic cleanser which kills germs and bonds  with the skin to continue killing germs even after washing.  Please do not use if you have an allergy to CHG or antibacterial soaps. If your skin becomes reddened/irritated stop using the CHG.  1. Shower the NIGHT BEFORE SURGERY and the MORNING OF SURGERY with CHG soap.  2. If you choose to wash your hair, wash your hair first as usual with your normal shampoo.  3. After shampooing, rinse your hair and body thoroughly to remove the shampoo.  4. Use CHG as you would any other liquid soap. You can apply CHG directly to the skin and wash gently with a scrungie or a clean washcloth.  5. Apply the CHG soap to your body only from the neck down. Do not use on open wounds or open sores. Avoid contact with your eyes, ears, mouth, and genitals (private parts). Wash face and genitals (private parts) with your normal soap.  6. Wash thoroughly, paying special attention to the area where your surgery will be performed.  7. Thoroughly rinse your body with warm water.  8. Do not shower/wash with your normal soap after using and rinsing off the CHG soap.  9. Pat yourself dry with a clean towel.  10. Wear clean pajamas to bed the night before surgery.  12. Place clean sheets on your bed the night of your first shower and do not sleep with  pets.  13. Shower again with the CHG soap on the day of surgery prior to arriving at the hospital.  14. Do not apply any deodorants/lotions/powders.  15. Please wear clean clothes to the hospital.

## 2022-12-24 ENCOUNTER — Other Ambulatory Visit: Payer: Self-pay

## 2022-12-24 ENCOUNTER — Encounter: Payer: Self-pay | Admitting: Obstetrics and Gynecology

## 2022-12-24 ENCOUNTER — Encounter
Admission: RE | Admit: 2022-12-24 | Discharge: 2022-12-24 | Disposition: A | Discharge status: Home / Self Care | Payer: Medicare Other | Source: Ambulatory Visit | Attending: Obstetrics and Gynecology | Admitting: Obstetrics and Gynecology

## 2022-12-24 ENCOUNTER — Observation Stay
Admission: EM | Admit: 2022-12-24 | Discharge: 2022-12-24 | Disposition: A | Discharge status: Home / Self Care | Payer: Medicare Other | Source: Home / Self Care | Admitting: Obstetrics and Gynecology

## 2022-12-24 DIAGNOSIS — Z01818 Encounter for other preprocedural examination: Secondary | ICD-10-CM | POA: Insufficient documentation

## 2022-12-24 DIAGNOSIS — M545 Low back pain, unspecified: Secondary | ICD-10-CM | POA: Insufficient documentation

## 2022-12-24 DIAGNOSIS — O26893 Other specified pregnancy related conditions, third trimester: Secondary | ICD-10-CM | POA: Insufficient documentation

## 2022-12-24 DIAGNOSIS — Z3A38 38 weeks gestation of pregnancy: Secondary | ICD-10-CM | POA: Insufficient documentation

## 2022-12-24 DIAGNOSIS — O479 False labor, unspecified: Secondary | ICD-10-CM | POA: Diagnosis present

## 2022-12-24 LAB — BASIC METABOLIC PANEL
Anion gap: 7 (ref 5–15)
BUN: 9 mg/dL (ref 6–20)
CO2: 20 mmol/L — ABNORMAL LOW (ref 22–32)
Calcium: 8.5 mg/dL — ABNORMAL LOW (ref 8.9–10.3)
Chloride: 110 mmol/L (ref 98–111)
Creatinine, Ser: 0.64 mg/dL (ref 0.44–1.00)
GFR, Estimated: >60 mL/min (ref >60)
Glucose, Bld: 62 mg/dL — ABNORMAL LOW (ref 70–99)
Potassium: 3.5 mmol/L (ref 3.5–5.1)
Sodium: 137 mmol/L (ref 135–145)

## 2022-12-24 LAB — CBC
HCT: 32.8 % — ABNORMAL LOW (ref 36.0–46.0)
Hemoglobin: 10.6 g/dL — ABNORMAL LOW (ref 12.0–15.0)
MCH: 27 pg (ref 26.0–34.0)
MCHC: 32.3 g/dL (ref 30.0–36.0)
MCV: 83.7 fL (ref 80.0–100.0)
Platelets: 254 10*3/uL (ref 150–400)
RBC: 3.92 MIL/uL (ref 3.87–5.11)
RDW: 13.8 % (ref 11.5–15.5)
WBC: 10.8 10*3/uL — ABNORMAL HIGH (ref 4.0–10.5)
nRBC: 0 % (ref 0.0–0.2)

## 2022-12-24 LAB — TYPE AND SCREEN
ABO/RH(D): O POS
Antibody Screen: NEGATIVE
Extend sample reason: UNDETERMINED

## 2022-12-24 MED ORDER — SOD CITRATE-CITRIC ACID 500-334 MG/5ML PO SOLN: 30.0000 mL | ORAL | Status: DC

## 2022-12-24 MED ORDER — CHLORHEXIDINE GLUCONATE 0.12 % MT SOLN
15.0000 mL | Freq: Once | OROMUCOSAL | Status: AC
  Administered 2022-12-25: 15 mL via OROMUCOSAL
  Filled 2022-12-24: qty 15

## 2022-12-24 MED ORDER — FAMOTIDINE 20 MG PO TABS
20.0000 mg | ORAL_TABLET | Freq: Once | ORAL | Status: AC
  Administered 2022-12-25: 20 mg via ORAL
  Filled 2022-12-24: qty 1

## 2022-12-24 MED ORDER — ORAL CARE MOUTH RINSE: 15.0000 mL | Freq: Once | OROMUCOSAL | Status: AC

## 2022-12-24 MED ORDER — GABAPENTIN 300 MG PO CAPS
300.0000 mg | ORAL_CAPSULE | Freq: Once | ORAL | Status: AC
  Administered 2022-12-25: 300 mg via ORAL
  Filled 2022-12-24: qty 1

## 2022-12-24 MED ORDER — LACTATED RINGERS IV SOLN: Freq: Once | INTRAVENOUS | Status: AC

## 2022-12-24 MED ORDER — CEFAZOLIN SODIUM-DEXTROSE 2-4 GM/100ML-% IV SOLN
2.0000 g | INTRAVENOUS | Status: AC
  Administered 2022-12-25: 2 g via INTRAVENOUS
  Filled 2022-12-24: qty 100

## 2022-12-24 MED ORDER — ACETAMINOPHEN 500 MG PO TABS
1000.0000 mg | ORAL_TABLET | Freq: Once | ORAL | Status: AC
  Administered 2022-12-25: 1000 mg via ORAL
  Filled 2022-12-24: qty 2

## 2022-12-24 NOTE — Anesthesia Preprocedure Evaluation (Signed)
Anesthesia Evaluation  Patient identified by MRN, date of birth, ID band Patient awake    Reviewed: Allergy & Precautions, NPO status , Patient's Chart, lab work & pertinent test results  History of Anesthesia Complications (+) PONV and history of anesthetic complications  Airway Mallampati: III   Neck ROM: Full    Dental no notable dental hx.    Pulmonary neg pulmonary ROS   Pulmonary exam normal breath sounds clear to auscultation       Cardiovascular Exercise Tolerance: Good Normal cardiovascular exam Rhythm:Regular Rate:Normal  ECG 02/10/21: normal   Neuro/Psych  PSYCHIATRIC DISORDERS  Depression    Intellectual disability    GI/Hepatic ,GERD (with pregnancy)  ,,  Endo/Other  Obesity   Renal/GU negative Renal ROS     Musculoskeletal   Abdominal   Peds  Hematology  (+) Blood dyscrasia, anemia   Anesthesia Other Findings   Reproductive/Obstetrics                              Anesthesia Physical Anesthesia Plan  ASA: 2  Anesthesia Plan: Spinal   Post-op Pain Management:    Induction: Intravenous  PONV Risk Score and Plan: 3 and Treatment may vary due to age or medical condition and Ondansetron  Airway Management Planned: Natural Airway  Additional Equipment:   Intra-op Plan:   Post-operative Plan:   Informed Consent:   Plan Discussed with: CRNA  Anesthesia Plan Comments: (Plan for spinal, LMA/GETA backup.  Patient consented for risks of anesthesia including but not limited to:  - adverse reactions to medications - damage to eyes, teeth, lips or other oral mucosa - nerve damage due to positioning  - sore throat or hoarseness - headache, bleeding, infection, nerve damage 2/2 spinal - damage to heart, brain, nerves, lungs, other parts of body or loss of life  Informed patient about role of CRNA in peri- and intra-operative care.  Patient voiced understanding.)          Anesthesia Quick Evaluation

## 2022-12-24 NOTE — Progress Notes (Signed)
RN spoke with pt's guardian to inform her of pt's discharge. Guardian verbalized understanding and denied having any additional questions at this time.

## 2022-12-24 NOTE — Discharge Summary (Signed)
Patient ID: [name] MRN: [id number] DOB/AGE: 33-06-1991 31 y.o.  Admit date: 12/24/2022 Discharge date: 12/24/2022  Admission Diagnoses: 31yo G3P2 [redacted]w[redacted]d presents with lower back pain and possible contractions.  Discharge Diagnoses: BPP 8/10, not in labor  Factors complicating pregnancy: Intellectual disability Pt declared incompetent by the court 03/2013 Pt cannot sign any consents legally.  Dorothe Pea is DSS appointed guardian. Phone: [phone] office number, Cell number is [phone]  Previous CS x 2 Iron deficiency anemia; s/p iron infusions Obesity, BMI 32.7 pre-preg  Prenatal Procedures: NST and ultrasound  Consults: None  Significant Diagnostic Studies:  Results for orders placed or performed during the hospital encounter of 12/24/22 (from the past 168 hour(s))  CBC   Collection Time: 12/24/22  9:08 AM  Result Value Ref Range   WBC 10.8 (H) 4.0 - 10.5 K/uL   RBC 3.92 3.87 - 5.11 MIL/uL   Hemoglobin 10.6 (L) 12.0 - 15.0 g/dL   HCT 13.2 (L) 44.0 - 10.2 %   MCV 83.7 80.0 - 100.0 fL   MCH 27.0 26.0 - 34.0 pg   MCHC 32.3 30.0 - 36.0 g/dL   RDW 72.5 36.6 - 44.0 %   Platelets 254 150 - 400 K/uL   nRBC 0.0 0.0 - 0.2 %  Basic metabolic panel   Collection Time: 12/24/22  9:08 AM  Result Value Ref Range   Sodium 137 135 - 145 mmol/L   Potassium 3.5 3.5 - 5.1 mmol/L   Chloride 110 98 - 111 mmol/L   CO2 20 (L) 22 - 32 mmol/L   Glucose, Bld 62 (L) 70 - 99 mg/dL   BUN 9 6 - 20 mg/dL   Creatinine, Ser 3.47 0.44 - 1.00 mg/dL   Calcium 8.5 (L) 8.9 - 10.3 mg/dL   GFR, Estimated >42 >59 mL/min   Anion gap 7 5 - 15  Type and screen [facility]   Collection Time: 12/24/22  9:08 AM  Result Value Ref Range   ABO/RH(D) O POS    Antibody Screen NEG    Sample Expiration 12/27/2022,2359    Extend sample reason      PREGNANT WITHIN 3 MONTHS, UNABLE TO EXTEND Performed at [facility], [address] [place], [place] [address]   Results  for orders placed or performed during the hospital encounter of 12/19/22 (from the past 168 hour(s))  Protein / creatinine ratio, urine   Collection Time: 12/19/22 12:23 PM  Result Value Ref Range   Creatinine, Urine 62 mg/dL   Total Protein, Urine 7 mg/dL   Protein Creatinine Ratio 0.11 0.00 - 0.15 mg/mg[Cre]  CBC   Collection Time: 12/19/22 12:36 PM  Result Value Ref Range   WBC 10.9 (H) 4.0 - 10.5 K/uL   RBC 3.65 (L) 3.87 - 5.11 MIL/uL   Hemoglobin 10.0 (L) 12.0 - 15.0 g/dL   HCT 56.4 (L) 33.2 - 95.1 %   MCV 82.7 80.0 - 100.0 fL   MCH 27.4 26.0 - 34.0 pg   MCHC 33.1 30.0 - 36.0 g/dL   RDW 88.4 16.6 - 06.3 %   Platelets 255 150 - 400 K/uL   nRBC 0.0 0.0 - 0.2 %  Comprehensive metabolic panel   Collection Time: 12/19/22 12:36 PM  Result Value Ref Range   Sodium 136 135 - 145 mmol/L   Potassium 3.9 3.5 - 5.1 mmol/L   Chloride 109 98 - 111 mmol/L   CO2 20 (L) 22 - 32 mmol/L   Glucose, Bld 96  70 - 99 mg/dL   BUN 11 6 - 20 mg/dL   Creatinine, Ser 9.20 0.44 - 1.00 mg/dL   Calcium 8.8 (L) 8.9 - 10.3 mg/dL   Total Protein 6.4 (L) 6.5 - 8.1 g/dL   Albumin 2.7 (L) 3.5 - 5.0 g/dL   AST 17 15 - 41 U/L   ALT 10 0 - 44 U/L   Alkaline Phosphatase 81 38 - 126 U/L   Total Bilirubin 0.6 0.3 - 1.2 mg/dL   GFR, Estimated >10 >07 mL/min   Anion gap 7 5 - 15    Treatments: none  Hospital Course:  This is a 32 y.o. G3P2002 with IUP at [redacted]w[redacted]d seen for a labor check.  NST was not reacitve, Dr. Feliberto Gottron notified of NRNST and no labor.  Dr. Feliberto Gottron personally completed a BPP and said she was good for discharge. Her cervical exam was unchanged from admission.  She was deemed stable for discharge to home with outpatient follow up.  Discharge Physical Exam:  LMP 03/27/2022 (Within Days)   General: NAD CV: RRR Pulm: nl effort ABD: s/nd/nt, gravid DVT Evaluation: LE non-ttp, no evidence of DVT on exam.  NST: NST noted on progress note by Dr. Feliberto Gottron  TOCO: rare SVE:   Dilation: Closed Effacement (%): Thick Cervical Position: Posterior Exam by:: Marzetta Merino, RN   Discharge Condition: Stable  Disposition: Discharge disposition: 01-Home or Self Care        Allergies as of 12/24/2022   No Known Allergies      Medication List     STOP taking these medications    hydrochlorothiazide 25 MG tablet Commonly known as: HYDRODIURIL   ibuprofen 600 MG tablet Commonly known as: ADVIL       TAKE these medications    acetaminophen 500 MG tablet Commonly known as: TYLENOL Take 2 tablets (1,000 mg total) by mouth every 6 (six) hours as needed for mild pain (surgery).   docusate sodium 100 MG capsule Commonly known as: Colace Take 1 capsule (100 mg total) by mouth daily as needed.   doxylamine (Sleep) 25 MG tablet Commonly known as: UNISOM Take 1 tablet (25 mg total) by mouth at bedtime as needed.   ferrous sulfate 325 (65 FE) MG tablet Take 325 mg by mouth daily with breakfast.   prenatal multivitamin Tabs tablet Take 1 tablet by mouth daily at 12 noon.         SignedHaroldine Laws, CNM 12/24/2022 3:42 PM

## 2022-12-24 NOTE — OB Triage Note (Signed)

## 2022-12-24 NOTE — OB Triage Note (Signed)
Pt [name] 32 y.o. presents to labor and delivery triage reporting lower back pain  with possible contractions and swelling in her feet. Pt is a G3P2002 at [redacted]w[redacted]d . Pt denies signs and symptoms consistent with rupture of membranes or active vaginal bleeding. Pt states positive fetal movement. External FM and TOCO applied to non-tender abdomen and assessing. Initial FHR 145. Vital signs obtained and within normal limits. Provider notified of pt.

## 2022-12-24 NOTE — Progress Notes (Signed)
Patient ID: [name], female   DOB: 03-Oct-1990, 32 y.o.   MRN: [id number] CNM [facility] notifiied my that pt has had prolonged monitoring and she can't  get a reactive NST .  BPP : performed by me   8/10  -2 for Non reactive NST . Fetal strip 10x10 accels , not meeting criteria AFi 7.9 cm with 33x27 mm pocket   A: reassuring fetal monitoring by BPP . Not in labor according to cx checks . Irregular CTX  P: d/c home with precautions  RTC if CTX increase . Scheduled c/s in am

## 2022-12-25 ENCOUNTER — Encounter
Admission: RE | Disposition: A | Discharge status: Home / Self Care | Payer: Medicare Other | Source: Home / Self Care | Attending: Obstetrics and Gynecology

## 2022-12-25 ENCOUNTER — Inpatient Hospital Stay
Admission: RE | Admit: 2022-12-25 | Discharge: 2022-12-28 | DRG: 787 | Disposition: A | Discharge status: Home / Self Care | Payer: Medicare Other | Attending: Obstetrics and Gynecology | Admitting: Obstetrics and Gynecology

## 2022-12-25 ENCOUNTER — Other Ambulatory Visit: Payer: Self-pay

## 2022-12-25 ENCOUNTER — Encounter: Payer: Self-pay | Admitting: Obstetrics and Gynecology

## 2022-12-25 ENCOUNTER — Inpatient Hospital Stay: Payer: Medicare Other | Admitting: Anesthesiology

## 2022-12-25 DIAGNOSIS — O34211 Maternal care for low transverse scar from previous cesarean delivery: Principal | ICD-10-CM | POA: Diagnosis present

## 2022-12-25 DIAGNOSIS — L910 Hypertrophic scar: Secondary | ICD-10-CM | POA: Diagnosis present

## 2022-12-25 DIAGNOSIS — O9902 Anemia complicating childbirth: Secondary | ICD-10-CM | POA: Diagnosis present

## 2022-12-25 DIAGNOSIS — D62 Acute posthemorrhagic anemia: Secondary | ICD-10-CM | POA: Diagnosis not present

## 2022-12-25 DIAGNOSIS — O99344 Other mental disorders complicating childbirth: Secondary | ICD-10-CM | POA: Diagnosis present

## 2022-12-25 DIAGNOSIS — O9972 Diseases of the skin and subcutaneous tissue complicating childbirth: Secondary | ICD-10-CM | POA: Diagnosis present

## 2022-12-25 DIAGNOSIS — F70 Mild intellectual disabilities: Secondary | ICD-10-CM | POA: Diagnosis present

## 2022-12-25 DIAGNOSIS — F4329 Adjustment disorder with other symptoms: Secondary | ICD-10-CM | POA: Diagnosis present

## 2022-12-25 DIAGNOSIS — O34219 Maternal care for unspecified type scar from previous cesarean delivery: Principal | ICD-10-CM | POA: Diagnosis present

## 2022-12-25 DIAGNOSIS — Z3A38 38 weeks gestation of pregnancy: Secondary | ICD-10-CM

## 2022-12-25 DIAGNOSIS — O99214 Obesity complicating childbirth: Secondary | ICD-10-CM | POA: Diagnosis present

## 2022-12-25 DIAGNOSIS — F4325 Adjustment disorder with mixed disturbance of emotions and conduct: Secondary | ICD-10-CM | POA: Diagnosis present

## 2022-12-25 LAB — RPR: RPR Ser Ql: NONREACTIVE

## 2022-12-25 SURGERY — Surgical Case
Anesthesia: Spinal

## 2022-12-25 MED ORDER — ENOXAPARIN SODIUM 40 MG/0.4ML IJ SOSY
40.0000 mg | PREFILLED_SYRINGE | INTRAMUSCULAR | Status: DC
  Administered 2022-12-26 – 2022-12-28 (×3): 40 mg via SUBCUTANEOUS
  Filled 2022-12-25 (×3): qty 0.4

## 2022-12-25 MED ORDER — MISOPROSTOL 200 MCG PO TABS
ORAL_TABLET | ORAL | Status: AC
  Filled 2022-12-25: qty 4

## 2022-12-25 MED ORDER — LIDOCAINE HCL (PF) 1 % IJ SOLN
INTRAMUSCULAR | Status: DC | PRN
  Administered 2022-12-25: 3 mL via SUBCUTANEOUS

## 2022-12-25 MED ORDER — FENTANYL CITRATE (PF) 100 MCG/2ML IJ SOLN
INTRAMUSCULAR | Status: DC | PRN
  Administered 2022-12-25: 15 ug via INTRATHECAL

## 2022-12-25 MED ORDER — OXYTOCIN-SODIUM CHLORIDE 30-0.9 UT/500ML-% IV SOLN
2.5000 [IU]/h | INTRAVENOUS | Status: AC
  Administered 2022-12-25: 2.5 [IU]/h via INTRAVENOUS

## 2022-12-25 MED ORDER — SENNOSIDES-DOCUSATE SODIUM 8.6-50 MG PO TABS
2.0000 | ORAL_TABLET | Freq: Every day | ORAL | Status: DC
  Administered 2022-12-26 – 2022-12-28 (×3): 2 via ORAL
  Filled 2022-12-25 (×3): qty 2

## 2022-12-25 MED ORDER — BUPIVACAINE IN DEXTROSE 0.75-8.25 % IT SOLN
INTRATHECAL | Status: DC | PRN
  Administered 2022-12-25: 1.5 mL via INTRATHECAL

## 2022-12-25 MED ORDER — IBUPROFEN 600 MG PO TABS: 600.0000 mg | ORAL_TABLET | Freq: Four times a day (QID) | ORAL | Status: DC

## 2022-12-25 MED ORDER — OXYTOCIN 10 UNIT/ML IJ SOLN
INTRAMUSCULAR | Status: AC
  Filled 2022-12-25: qty 2

## 2022-12-25 MED ORDER — DIBUCAINE (PERIANAL) 1 % EX OINT: 1.0000 | TOPICAL_OINTMENT | CUTANEOUS | Status: DC | PRN

## 2022-12-25 MED ORDER — SIMETHICONE 80 MG PO CHEW
80.0000 mg | CHEWABLE_TABLET | Freq: Three times a day (TID) | ORAL | Status: DC
  Administered 2022-12-25 – 2022-12-28 (×8): 80 mg via ORAL
  Filled 2022-12-25 (×9): qty 1

## 2022-12-25 MED ORDER — BUPIVACAINE HCL (PF) 0.25 % IJ SOLN
INTRAMUSCULAR | Status: AC
  Filled 2022-12-25: qty 60

## 2022-12-25 MED ORDER — WITCH HAZEL-GLYCERIN EX PADS: 1.0000 | MEDICATED_PAD | CUTANEOUS | Status: DC | PRN

## 2022-12-25 MED ORDER — DIPHENHYDRAMINE HCL 50 MG/ML IJ SOLN: 12.5000 mg | INTRAMUSCULAR | Status: DC | PRN

## 2022-12-25 MED ORDER — CEFAZOLIN SODIUM-DEXTROSE 2-4 GM/100ML-% IV SOLN
INTRAVENOUS | Status: AC
  Filled 2022-12-25: qty 100

## 2022-12-25 MED ORDER — SOD CITRATE-CITRIC ACID 500-334 MG/5ML PO SOLN
ORAL | Status: AC
  Filled 2022-12-25: qty 15

## 2022-12-25 MED ORDER — COCONUT OIL OIL: 1.0000 | TOPICAL_OIL | Status: DC | PRN

## 2022-12-25 MED ORDER — KETOROLAC TROMETHAMINE 30 MG/ML IJ SOLN
30.0000 mg | Freq: Four times a day (QID) | INTRAMUSCULAR | Status: DC
  Administered 2022-12-25: 30 mg via INTRAVENOUS
  Filled 2022-12-25: qty 1

## 2022-12-25 MED ORDER — SODIUM CHLORIDE 0.9% FLUSH: 3.0000 mL | INTRAVENOUS | Status: DC | PRN

## 2022-12-25 MED ORDER — ACETAMINOPHEN 500 MG PO TABS
1000.0000 mg | ORAL_TABLET | Freq: Four times a day (QID) | ORAL | Status: AC
  Administered 2022-12-25 – 2022-12-26 (×3): 1000 mg via ORAL
  Filled 2022-12-25 (×3): qty 2

## 2022-12-25 MED ORDER — OXYTOCIN-SODIUM CHLORIDE 30-0.9 UT/500ML-% IV SOLN
INTRAVENOUS | Status: AC
  Filled 2022-12-25: qty 500

## 2022-12-25 MED ORDER — DIPHENHYDRAMINE HCL 50 MG/ML IJ SOLN
INTRAMUSCULAR | Status: AC
  Filled 2022-12-25: qty 1

## 2022-12-25 MED ORDER — SODIUM CHLORIDE (PF) 0.9 % IJ SOLN
INTRAMUSCULAR | Status: AC
  Filled 2022-12-25: qty 50

## 2022-12-25 MED ORDER — LACTATED RINGERS IV SOLN: INTRAVENOUS | Status: DC | PRN

## 2022-12-25 MED ORDER — ONDANSETRON HCL 4 MG/2ML IJ SOLN: 4.0000 mg | Freq: Three times a day (TID) | INTRAMUSCULAR | Status: DC | PRN

## 2022-12-25 MED ORDER — LACTATED RINGERS IV SOLN: INTRAVENOUS | Status: DC

## 2022-12-25 MED ORDER — ACETAMINOPHEN 500 MG PO TABS
ORAL_TABLET | ORAL | Status: AC
  Filled 2022-12-25: qty 2

## 2022-12-25 MED ORDER — PRENATAL MULTIVITAMIN CH
1.0000 | ORAL_TABLET | Freq: Every day | ORAL | Status: DC
  Administered 2022-12-26 – 2022-12-28 (×3): 1 via ORAL
  Filled 2022-12-25 (×3): qty 1

## 2022-12-25 MED ORDER — GABAPENTIN 300 MG PO CAPS
300.0000 mg | ORAL_CAPSULE | Freq: Every day | ORAL | Status: DC
  Administered 2022-12-26 – 2022-12-27 (×3): 300 mg via ORAL
  Filled 2022-12-25 (×3): qty 1

## 2022-12-25 MED ORDER — SIMETHICONE 80 MG PO CHEW: 80.0000 mg | CHEWABLE_TABLET | ORAL | Status: DC | PRN

## 2022-12-25 MED ORDER — GABAPENTIN 300 MG PO CAPS
ORAL_CAPSULE | ORAL | Status: AC
  Filled 2022-12-25: qty 1

## 2022-12-25 MED ORDER — NALOXONE HCL 0.4 MG/ML IJ SOLN: 0.4000 mg | INTRAMUSCULAR | Status: DC | PRN

## 2022-12-25 MED ORDER — KETOROLAC TROMETHAMINE 30 MG/ML IJ SOLN
INTRAMUSCULAR | Status: AC
  Filled 2022-12-25: qty 1

## 2022-12-25 MED ORDER — MORPHINE SULFATE (PF) 0.5 MG/ML IJ SOLN
INTRAMUSCULAR | Status: AC
  Filled 2022-12-25: qty 10

## 2022-12-25 MED ORDER — OXYCODONE HCL 5 MG PO TABS
5.0000 mg | ORAL_TABLET | Freq: Four times a day (QID) | ORAL | Status: DC | PRN
  Administered 2022-12-26 – 2022-12-27 (×3): 5 mg via ORAL
  Filled 2022-12-25 (×3): qty 1

## 2022-12-25 MED ORDER — MORPHINE SULFATE (PF) 2 MG/ML IV SOLN: 1.0000 mg | INTRAVENOUS | Status: DC | PRN

## 2022-12-25 MED ORDER — KETOROLAC TROMETHAMINE 30 MG/ML IJ SOLN
INTRAMUSCULAR | Status: DC | PRN
  Administered 2022-12-25: 30 mg via INTRAVENOUS

## 2022-12-25 MED ORDER — DIPHENHYDRAMINE HCL 25 MG PO CAPS: 25.0000 mg | ORAL_CAPSULE | ORAL | Status: DC | PRN

## 2022-12-25 MED ORDER — FENTANYL CITRATE (PF) 100 MCG/2ML IJ SOLN
INTRAMUSCULAR | Status: AC
  Filled 2022-12-25: qty 2

## 2022-12-25 MED ORDER — MEPERIDINE HCL 25 MG/ML IJ SOLN: 6.2500 mg | INTRAMUSCULAR | Status: DC | PRN

## 2022-12-25 MED ORDER — ZOLPIDEM TARTRATE 5 MG PO TABS: 5.0000 mg | ORAL_TABLET | Freq: Every evening | ORAL | Status: DC | PRN

## 2022-12-25 MED ORDER — PHENYLEPHRINE HCL-NACL 20-0.9 MG/250ML-% IV SOLN
INTRAVENOUS | Status: AC
  Filled 2022-12-25: qty 250

## 2022-12-25 MED ORDER — OXYTOCIN-SODIUM CHLORIDE 30-0.9 UT/500ML-% IV SOLN
INTRAVENOUS | Status: DC | PRN
  Administered 2022-12-25: 30 [IU] via INTRAVENOUS

## 2022-12-25 MED ORDER — MENTHOL 3 MG MT LOZG: 1.0000 | LOZENGE | OROMUCOSAL | Status: DC | PRN

## 2022-12-25 MED ORDER — IBUPROFEN 600 MG PO TABS
600.0000 mg | ORAL_TABLET | Freq: Four times a day (QID) | ORAL | Status: DC
  Administered 2022-12-26 – 2022-12-28 (×11): 600 mg via ORAL
  Filled 2022-12-25 (×12): qty 1

## 2022-12-25 MED ORDER — SCOPOLAMINE 1 MG/3DAYS TD PT72: 1.0000 | MEDICATED_PATCH | Freq: Once | TRANSDERMAL | Status: DC

## 2022-12-25 MED ORDER — ONDANSETRON HCL 4 MG/2ML IJ SOLN
INTRAMUSCULAR | Status: AC
  Filled 2022-12-25: qty 2

## 2022-12-25 MED ORDER — ONDANSETRON HCL 4 MG/2ML IJ SOLN
INTRAMUSCULAR | Status: DC | PRN
  Administered 2022-12-25: 4 mg via INTRAVENOUS

## 2022-12-25 MED ORDER — LIDOCAINE HCL (PF) 1 % IJ SOLN
INTRAMUSCULAR | Status: AC
  Filled 2022-12-25: qty 30

## 2022-12-25 MED ORDER — 0.9 % SODIUM CHLORIDE (POUR BTL) OPTIME
TOPICAL | Status: DC | PRN
  Administered 2022-12-25: 1000 mL

## 2022-12-25 MED ORDER — DIPHENHYDRAMINE HCL 50 MG/ML IJ SOLN
INTRAMUSCULAR | Status: DC | PRN
  Administered 2022-12-25: 12.5 mg via INTRAVENOUS

## 2022-12-25 MED ORDER — KETOROLAC TROMETHAMINE 30 MG/ML IJ SOLN: 30.0000 mg | Freq: Four times a day (QID) | INTRAMUSCULAR | Status: DC | PRN

## 2022-12-25 MED ORDER — NALOXONE HCL 4 MG/10ML IJ SOLN: 1.0000 ug/kg/h | INTRAVENOUS | Status: DC | PRN

## 2022-12-25 MED ORDER — AMMONIA AROMATIC IN INHA
RESPIRATORY_TRACT | Status: AC
  Filled 2022-12-25: qty 10

## 2022-12-25 MED ORDER — MORPHINE SULFATE (PF) 0.5 MG/ML IJ SOLN
INTRAMUSCULAR | Status: DC | PRN
  Administered 2022-12-25: .1 mg via INTRATHECAL

## 2022-12-25 MED ORDER — PHENYLEPHRINE HCL-NACL 20-0.9 MG/250ML-% IV SOLN
INTRAVENOUS | Status: DC | PRN
  Administered 2022-12-25: 25 ug/min via INTRAVENOUS

## 2022-12-25 SURGICAL SUPPLY — 33 items
APL PRP STRL LF DISP 70% ISPRP (MISCELLANEOUS) ×1
BARRIER ADHS 3X4 INTERCEED (GAUZE/BANDAGES/DRESSINGS) ×1 IMPLANT
BRR ADH 4X3 ABS CNTRL BYND (GAUZE/BANDAGES/DRESSINGS) ×1
CHLORAPREP W/TINT 26 (MISCELLANEOUS) ×1 IMPLANT
DRSG TELFA 3X8 NADH STRL (GAUZE/BANDAGES/DRESSINGS) ×1 IMPLANT
ELECT CAUTERY BLADE 6.4 (BLADE) ×1 IMPLANT
ELECT REM PT RETURN 9FT ADLT (ELECTROSURGICAL) ×1
ELECTRODE REM PT RTRN 9FT ADLT (ELECTROSURGICAL) ×1 IMPLANT
GAUZE SPONGE 4X4 12PLY STRL (GAUZE/BANDAGES/DRESSINGS) ×1 IMPLANT
GLOVE SURG SYN 8.0 (GLOVE) ×1 IMPLANT
GLOVE SURG SYN 8.0 PF PI (GLOVE) ×1 IMPLANT
GOWN STRL REUS W/ TWL LRG LVL3 (GOWN DISPOSABLE) ×2 IMPLANT
GOWN STRL REUS W/ TWL XL LVL3 (GOWN DISPOSABLE) ×1 IMPLANT
GOWN STRL REUS W/TWL LRG LVL3 (GOWN DISPOSABLE) ×2
GOWN STRL REUS W/TWL XL LVL3 (GOWN DISPOSABLE) ×1
MANIFOLD NEPTUNE II (INSTRUMENTS) ×1 IMPLANT
MAT PREVALON FULL STRYKER (MISCELLANEOUS) ×1 IMPLANT
NDL HYPO 22X1.5 SAFETY MO (MISCELLANEOUS) ×1 IMPLANT
NEEDLE HYPO 22X1.5 SAFETY MO (MISCELLANEOUS) ×1 IMPLANT
NS IRRIG 1000ML POUR BTL (IV SOLUTION) ×1 IMPLANT
PACK C SECTION AR (MISCELLANEOUS) ×1 IMPLANT
PAD OB MATERNITY 4.3X12.25 (PERSONAL CARE ITEMS) ×1 IMPLANT
PAD PREP 24X41 OB/GYN DISP (PERSONAL CARE ITEMS) ×1 IMPLANT
SCRUB CHG 4% DYNA-HEX 4OZ (MISCELLANEOUS) ×1 IMPLANT
STRAP SAFETY 5IN WIDE (MISCELLANEOUS) ×1 IMPLANT
SUT CHROMIC 1 CTX 36 (SUTURE) ×3 IMPLANT
SUT PLAIN GUT 0 (SUTURE) ×2 IMPLANT
SUT VIC AB 0 CT1 36 (SUTURE) ×2 IMPLANT
SUT VIC AB 2-0 SH 27 (SUTURE) ×1
SUT VIC AB 2-0 SH 27XBRD (SUTURE) IMPLANT
SYR 30ML LL (SYRINGE) ×2 IMPLANT
TRAP FLUID SMOKE EVACUATOR (MISCELLANEOUS) ×1 IMPLANT
WATER STERILE IRR 500ML POUR (IV SOLUTION) ×1 IMPLANT

## 2022-12-25 NOTE — TOC Initial Note (Addendum)
Transition of Care [facility]) - Initial/Assessment Note    Patient Details  Name: [name] MRN: [id number] Date of Birth: 1990/12/26  [facility]) CM/SW Contact:    Margarito Liner, LCSW Phone Number: 12/25/2022, 11:27 AM  Clinical Narrative:   Received call from Shanda Bumps from mother/baby unit this morning. Patient has a legal guardian through [facility] DSS (Dorothe Pea) and patient's children are under guardianship of [facility]. CSW spoke to Ms. Hunter and confirmed and stated gave names of foster care social worker and her supervisor at [facility]. CSW called CPS and spoke to [place]. Provided requested information. They will follow up once they have reviewed information. RN is aware.              11:47 am: CPS will send a social worker to the hospital prior to discharge. Spoke to [place], the foster care social worker. She said patient can only be with baby if supervised by staff. They do not feel comfortable with family providing the supervision due to differing stories that patient has provided about individual she calls her aunt and cousin. RN is aware.  Expected Discharge Plan: Home/Self Care Barriers to Discharge: Continued Medical Work up   Patient Goals and CMS Choice            Expected Discharge Plan and Services       Living arrangements for the past 2 months: Single Family Home                                      Prior Living Arrangements/Services Living arrangements for the past 2 months: Single Family Home   Patient language and need for interpreter reviewed:: Yes        Need for Family Participation in Patient Care: Yes (Comment)     Criminal Activity/Legal Involvement Pertinent to Current Situation/Hospitalization: No - Comment as needed  Activities of Daily Living Home Assistive Devices/Equipment: None ADL Screening (condition at time of admission) Patient's cognitive ability adequate to safely complete  daily activities?: Yes Is the patient deaf or have difficulty hearing?: No Does the patient have difficulty seeing, even when wearing glasses/contacts?: No Does the patient have difficulty concentrating, remembering, or making decisions?: No Patient able to express need for assistance with ADLs?: Yes Does the patient have difficulty dressing or bathing?: No Does the patient have difficulty walking or climbing stairs?: No Weakness of Legs: None Weakness of Arms/Hands: None  Permission Sought/Granted                  Emotional Assessment       Orientation: : Oriented to Self, Oriented to Place, Oriented to  Time, Oriented to Situation Alcohol / Substance Use: Not Applicable Psych Involvement: No (comment)  Admission diagnosis:  Previous cesarean delivery affecting pregnancy [O34.219] Patient Active Problem List   Diagnosis Date Noted   Uterine contractions 12/24/2022   NST (non-stress test) nonreactive 12/19/2022   Abdominal pain during pregnancy in third trimester 11/08/2022   Previous cesarean delivery affecting pregnancy 09/12/2021   Postoperative state 09/12/2021   Sciatic pain 08/03/2021   Uterine size date discrepancy, second trimester 08/03/2021   Abdominal cramping affecting pregnancy 06/12/2021   Encounter for supervision of other normal pregnancy, third trimester 02/15/2021   Hyperemesis affecting pregnancy, antepartum 02/13/2021   Intermittent palpitations 06/25/2017   Family history of diabetes mellitus 07/04/2016   Sepsis  11/27/2015   Pyelonephritis 11/27/2015   AKI (acute kidney injury) 11/27/2015   Herpes simplex virus (HSV) infection 03/31/2014   Major depressive disorder, 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 [place] [address] Phone: [phone] Fax: [phone]  [facility] - [place], [place] - [address] [place] [address] Phone: [phone] Fax: [phone]  [facility] [address]), [place] - [address] [place] ([place]) [place] [address] Phone: [phone] Fax: [phone]     Social Determinants of Health (SDOH) Social History: SDOH Screenings   Food Insecurity: No Food Insecurity (12/25/2022)  Housing: Low Risk  (12/25/2022)  Transportation Needs: No Transportation Needs (12/25/2022)  Utilities: Not At Risk (12/25/2022)  Depression (PHQ2-9): Low Risk  (05/07/2022)  Tobacco Use: Low Risk  (12/25/2022)   SDOH Interventions:     Readmission Risk Interventions     No data to display

## 2022-12-25 NOTE — Anesthesia Procedure Notes (Signed)
Spinal  Patient location during procedure: OR Start time: 12/25/2022 8:40 AM End time: 12/25/2022 8:44 AM Reason for block: surgical anesthesia Staffing Performed: resident/CRNA  Resident/CRNA: Lynden Oxford, CRNA Performed by: Lynden Oxford, CRNA Authorized by: Foye Deer, MD   Preanesthetic Checklist Completed: patient identified, IV checked, site marked, risks and benefits discussed, surgical consent, monitors and equipment checked, pre-op evaluation and timeout performed Spinal Block Patient position: sitting Prep: DuraPrep Patient monitoring: heart rate, cardiac monitor, continuous pulse ox and blood pressure Approach: midline Location: L3-4 Injection technique: single-shot Needle Needle type: Pencan  Needle gauge: 25 G Needle length: 9 cm Assessment Sensory level: T4 Events: CSF return

## 2022-12-25 NOTE — Op Note (Signed)
NAMEHAYLI, [name] MEDICAL RECORD NO: [id number] ACCOUNT NO: [id number] DATE OF BIRTH: 1991/02/26 FACILITY: [facility] LOCATION: [facility] PHYSICIAN: Suzy Bouchard, MD  Operative Report   DATE OF PROCEDURE: 12/25/2022  PREOPERATIVE DIAGNOSES:   1.  A 39+0 weeks estimated gestational age. 2.  Elective repeat cesarean section. 3.  Keloid scar.  POSTOPERATIVE DIAGNOSES:   1.  A 39+0 weeks estimated gestational age. 2.  Elective repeat cesarean section. 3.  Keloid scar. 4.  Vigorous female, delivered.  PROCEDURES: 1.  Cicatrix removal.  2.  Repeat low transverse cesarean section.  ANESTHESIA:  Spinal.  SURGEON:  Suzy Bouchard, MD  FIRST ASSISTANT:  Haroldine Laws, certified nurse midwife.  SECOND ASSISTANT:  PA student.  INDICATIONS:  32 year old gravida 4, para 2 with 2 prior cesarean sections elects for repeat cesarean section.  DESCRIPTION OF PROCEDURE:  After adequate spinal anesthesia, the patient was placed in dorsal supine position, hip roll placed under the right side.  The patient's abdomen was prepped and draped in normal sterile fashion.  Timeout was performed.  The  patient did receive 2 grams Ancef prior to incision for surgical prophylaxis.  A cicatrix removal of full thickness keloid scar was performed.  Sharp dissection was then utilized to score the fascia bilaterally.  The fascia was then opened in a  transverse fashion.  Superior aspect of the fascia was grasped with Kocher clamps and the recti muscles were dissected free.  Inferior aspect of the fascia was grasped with Kocher clamps and the pyramidalis muscle was dissected free.  Entry into the  peritoneal cavity was accomplished sharply.  Vesicouterine peritoneal fold was identified and bladder flap was created and bladder was reflected inferiorly.  Low transverse uterine incision was made.  Upon entry into the endometrial cavity, clear fluid  resulted.  The incision was extended with blunt  transverse traction.  Fetal head was brought to the incision and a Kiwi vacuum was applied to the occiput and with 2 pulls the head was delivered, vacuum was removed and a double nuchal cord was reduced,  followed by delivery of the shoulders and the body.  Vigorous female was then dried on the abdomen for 60 seconds.  Cord was doubly clamped and vigorous female was passed to nursery staff who assigned Apgar scores of 8 and 9, fetal weight 3110 grams.   Placenta was manually delivered and the uterus was exteriorized and the endometrial cavity was wiped clean with laparotomy tape.  Uterine incision was then closed with 1 chromic suture in a running locking fashion.  Several additional figure-of-eight  sutures were required for hemostasis.  Fallopian tubes and ovaries appeared normal.  Posterior cul-de-sac was irrigated and suctioned and the uterus was placed back into the abdominal cavity and the pericolic gutters were wiped clean with the laparotomy  tape.  Uterine incision appeared hemostatic and Interceed was placed over the uterine incision in T-shape fashion.  Fascia was then closed with 0 Vicryl suture in a running nonlocking fashion, two separate sutures were used.  Fascial edges were injected  with a 60 mL 0.5% Marcaine plus 20 mL normal saline.  30 mL solution was utilized.  The subcutaneous tissues were irrigated and bovied for hemostasis and the skin was reapproximated with Insorb absorbable staples.  Good cosmetic effect.  Pressure  dressing applied.  The patient did receive 30 mg Toradol at the end of the procedure.  QUANTITATIVE BLOOD LOSS:  285 mL.  INTRAOPERATIVE FLUIDS:  400 mL  URINE OUTPUT:  150 mL.  The patient was taken to recovery room in good condition.   PUS D: 12/25/2022 10:01:30 am T: 12/25/2022 11:19:00 am  JOB: [id number]/ [id number]

## 2022-12-25 NOTE — Transfer of Care (Signed)
Immediate Anesthesia Transfer of Care Note  Patient: [name]  Procedure(s) Performed: REPEAT CESAREAN SECTION, KELOID EXCISION  Patient Location: Mother/Baby  Anesthesia Type:Spinal  Level of Consciousness: awake, alert , oriented, and patient cooperative  Airway & Oxygen Therapy: Patient Spontanous Breathing  Post-op Assessment: Report given to RN and Post -op Vital signs reviewed and stable  Post vital signs: Reviewed and stable  Last Vitals:  Vitals Value Taken Time  BP 99/79 (86)   Temp    Pulse 68   Resp 21   SpO2 98%     Last Pain:  Vitals:   12/25/22 0721  TempSrc: Oral  PainSc: 0-No pain         Complications: No notable events documented.

## 2022-12-25 NOTE — Progress Notes (Signed)
I consented patient around 7:08 am. I was told that the legal guardian was on her way. Speaking with Efrain Sella, RN, I offered to call the legal guardian to obtain consent if they were only needing anesthesia consent. I was told that further paperwork was needed prior to the cesarean section taking place, so I did not call. However, I saw the note from Aggie Cosier stating they were waiting on anesthesia consent, so I called the guardian for consent promptly. It turns out that the obstetric consent was already complete, and no one called me to alert me that I could call the guardian.   Nelta Numbers MD Ermalinda Memos

## 2022-12-25 NOTE — Brief Op Note (Signed)
12/25/2022  9:39 AM  PATIENT:  [name]  32 y.o. female  PRE-OPERATIVE DIAGNOSIS:  elective repeat 39+0 weeks  Keloid   POST-OPERATIVE DIAGNOSIS: same   PROCEDURE:  Procedure(s): REPEAT CESAREAN SECTION, KELOID EXCISION (N/A)  SURGEON:  Surgeon(s) and Role:    * Jermany Rimel, Ihor Austin, MD - Primary  PHYSICIAN ASSISTANT: Haroldine Laws , cnm   ASSISTANTS: PA student   ANESTHESIA:   spinal  EBL:  qbl 285cc IOF 400cc ou 150cc  BLOOD ADMINISTERED:none  DRAINS: Urinary Catheter (Foley)   LOCAL MEDICATIONS USED:  MARCAINE     SPECIMEN:  No Specimen  DISPOSITION OF SPECIMEN:  N/A  COUNTS:  YES  TOURNIQUET:  * No tourniquets in log *  DICTATION: .Other Dictation: Dictation Number verbal  PLAN OF CARE: Admit to inpatient   PATIENT DISPOSITION:  PACU - hemodynamically stable.   Delay start of Pharmacological VTE agent (>24hrs) due to surgical blood loss or risk of bleeding: not applicable

## 2022-12-25 NOTE — TOC Progression Note (Addendum)
Transition of Care [facility]) - Progression Note    Patient Details  Name: [name] MRN: [id number] Date of Birth: 1991-09-10  Transition of Care [facility]) CM/SW Contact  Darleene Cleaver, [place] Phone Number: 12/25/2022, 5:22 PM  Clinical Narrative:     Per Lurena Joiner at [facility] CPS, [phone], CPS worker will be Dahlia Client, [phone]. Per Gwynneth Aliment will contact patient and inform her of CPS recommendations.  Per Lurena Joiner, CPS should be meeting with mother within 24hours of referral.  Lurena Joiner, was not sure if Dahlia Client will be seeing patient today, but she will update Dahlia Client as well.  TOC attempted to contact Dahlia Client to provide her with the phone number to speak to patient, and TOC was unable to leave a message due to voice mail being full.  After hours [facility] CPS number is [phone] or [phone].     Expected Discharge Plan: Home/Self Care Barriers to Discharge: Continued Medical Work up  Expected Discharge Plan and Services       Living arrangements for the past 2 months: Single Family Home                                       Social Determinants of Health (SDOH) Interventions SDOH Screenings   Food Insecurity: No Food Insecurity (12/25/2022)  Housing: Low Risk  (12/25/2022)  Transportation Needs: No Transportation Needs (12/25/2022)  Utilities: Not At Risk (12/25/2022)  Depression (PHQ2-9): Low Risk  (05/07/2022)  Tobacco Use: Low Risk  (12/25/2022)    Readmission Risk Interventions     No data to display

## 2022-12-25 NOTE — Progress Notes (Signed)
During shift change patient was tearful and upset about baby being placed in SCN. Patient stated " I am going to take my IV out". Patient took leads off and denied oxygen monitoring at this time. RN took IV and foley out to provide comfort to patient. RN provided emotional support to patient who quickly calmed down. Patient calm and cooperative at this time but still tearful. RN cleaned patient up and wheeled patient to SCN to see her baby.

## 2022-12-25 NOTE — Progress Notes (Signed)
Legal Guardian of patient notified of patient arrival to unit. Legal Guardian stated she is up and getting dressed and will arrive shortly to be here with patient,.

## 2022-12-25 NOTE — TOC Progression Note (Signed)
   Patient Details  Name: [name] MRN: [id number] Date of Birth: [birth year]     Call from [place] with [place] CPS. She states at this time no meeting or action plan has been made for the care of infant so they cannot legally remove the infant from her care. I explained to Huntley Dec that at this time the infant is in the special care nursery and Laconda is calm at the bedside. For this time we will leave this as the current situation. Jesse TOC at [facility] is also aware of the plan for the night.  Pt is presently sitting calmly in the nursery with her infant.

## 2022-12-25 NOTE — Discharge Summary (Cosign Needed Addendum)
Obstetrical Discharge Summary  Patient Name: [name] DOB: 11-23-1990 MRN: [id number]  Date of Admission: 12/25/2022 Date of Delivery:12/25/2022 Delivered by: Beverly Gust MD Date of Discharge: 12/28/22  Primary OB: [name] ASN:[id number] last menstrual period was 03/27/2022 (within days). 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   Uterine contractions 12/24/2022   NST (non-stress test) nonreactive 12/19/2022   Abdominal pain during pregnancy in third trimester 11/08/2022   Previous cesarean delivery affecting pregnancy 09/12/2021   Postoperative state 09/12/2021   Sciatic pain 08/03/2021   Uterine size date discrepancy, second trimester 08/03/2021   Abdominal cramping affecting pregnancy 06/12/2021   Encounter for supervision of other normal pregnancy, third trimester 02/15/2021   Intermittent palpitations 06/25/2017   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 0 0   I have looked forward with enjoyment to things. 0 0   I have blamed myself unnecessarily when things went wrong. 2 0   I have been anxious or worried for no good reason. 0 2   I have  felt scared or panicky for no good reason. 3 0   Things have been getting on top of me. 0 0   I have been so unhappy that I have had difficulty sleeping. 0 0   I have felt sad or miserable. 0 0   I have been so unhappy that I have been crying. 0 0   The thought of harming myself has occurred to me. 0 0   Edinburgh Postnatal Depression Scale Total 5 2      Information is confidential and restricted. Go to Review Flowsheets to unlock data.    Post partum course:   Patient had an uncomplicated postpartum course.  By time of discharge on POD#3, her pain was controlled on oral pain medications; she had appropriate lochia and was ambulating, voiding without difficulty, tolerating regular diet and passing flatus.   She was deemed stable for discharge to home.   She went home with one of her cousins. Her baby went home in the custody of a different cousin.  Discharge Physical Exam:   BP 124/85 (BP Location: Left Arm)   Pulse 82   Temp 98.2 F (36.8 C) (Oral)   Resp 18   Ht 5\' 4"  (1.626 m)   Wt 95 kg   LMP 03/27/2022 (Within Days)   SpO2 99%   BMI 35.95 kg/m   General: NAD CV: RRR Pulm: CTABL, nl effort ABD: s/nd/nt, fundus firm and below the umbilicus Lochia: moderate Incision: c/d/I, honeycomb dressing in place Skin breakdown noted in right abdomen d/t tape removal from pressure dressing. Covered with gauze dressing. Bacitracin applied.  DVT Evaluation: LE non-ttp, no evidence of DVT on exam.  Hemoglobin  Date Value  Ref Range Status  12/26/2022 8.2 (L) 12.0 - 15.0 g/dL Final   HGB  Date Value Ref Range Status  03/20/2014 11.6 (L) 12.0 - 16.0 g/dL Final   HCT  Date Value Ref Range Status  12/26/2022 25.1 (L) 36.0 - 46.0 % Final  03/20/2014 34.7 (L) 35.0 - 47.0 % Final     Disposition: stable, discharge to home. Baby Feeding: formula Baby Disposition: CPS took custody of baby and baby placed with mother's cousin  Rh Immune globulin given: n/a, O pos Rubella vaccine  given: n/a  Tdap vaccine given in AP or PP setting: received 10/09/22 Flu vaccine given in AP or PP setting: received 07/24/22  Risk assessment for postpartum VTE and prophylactic treatment: Very high risk factors: None High risk factors: None Moderate risk factors: Cesarean delivery  and BMI 30-40 kg/m2  Postpartum VTE prophylaxis with LMWH not indicated  Contraception: Depo (received first dose of depo 12/28/22)  Prenatal Labs:   ABO, Rh:  O+ Antibody:  neg  Rubella:  Imm , VZ IMM RPR:   Nr  HBsAg:   neg , Hep C Neg  HIV:   neg  GBS:   negative  Plan:  [name] was discharged to home in good condition. Follow-up appointment with delivering provider in 6 weeks.  Discharge Medications: Allergies as of 12/28/2022   No Known Allergies      Medication List     TAKE these medications    acetaminophen 500 MG tablet Commonly known as: TYLENOL Take 2 tablets (1,000 mg total) by mouth every 6 (six) hours as needed for mild pain (surgery).   docusate sodium 100 MG capsule Commonly known as: Colace Take 1 capsule (100 mg total) by mouth daily as needed.   doxylamine (Sleep) 25 MG tablet Commonly known as: UNISOM Take 1 tablet (25 mg total) by mouth at bedtime as needed.   ferrous sulfate 325 (65 FE) MG tablet Take 1 tablet (325 mg total) by mouth 2 (two) times daily with a meal. What changed: when to take this   ibuprofen 600 MG tablet Commonly known as: ADVIL Take 1 tablet (600 mg total) by mouth every 6 (six) hours as needed for mild pain or cramping.   medroxyPROGESTERone 150 MG/ML injection Commonly known as: DEPO-PROVERA Inject 1 mL (150 mg total) into the muscle every 3 (three) months.   NIFEdipine 30 MG 24 hr tablet Commonly known as: ADALAT CC Take 1 tablet (30 mg total) by mouth daily. Start taking on: December 29, 2022   oxyCODONE 5 MG immediate release tablet Commonly known as: Oxy IR/ROXICODONE Take 1 tablet (5 mg total) by mouth every 6 (six)  hours as needed for up to 7 days for severe pain.   prenatal multivitamin Tabs tablet Take 1 tablet by mouth daily at 12 noon.   senna-docusate 8.6-50 MG tablet Commonly known as: Senokot-S Take 2 tablets by mouth at bedtime as needed for mild constipation.   simethicone 80 MG chewable tablet Commonly known as: MYLICON Chew 1 tablet (80 mg total) by mouth 4 (four) times daily as needed for flatulence.         Follow-up Information     [facility] Follow up in 3 day(s).   Why: BP check Contact information: [address] [place] [address] [phone]        Schermerhorn, Ihor Austin, MD Follow up in 2 week(s).   Specialty: Obstetrics and Gynecology Why: 2wk incision check Contact information:  [address] [facility] [place] [id number] [phone]                 Signed: Blanchard KelchDanielle Renee Jennalynn Rivard, CNM 12/28/2022 12:09 PM

## 2022-12-25 NOTE — Progress Notes (Signed)
RN Arnette Norris called Legal Guardian at [phone] to request ETA for this pt. She needs to be present for anesthesia consents prior to the procedure.

## 2022-12-26 ENCOUNTER — Encounter: Payer: Self-pay | Admitting: Obstetrics and Gynecology

## 2022-12-26 LAB — CBC
HCT: 25.1 % — ABNORMAL LOW (ref 36.0–46.0)
Hemoglobin: 8.2 g/dL — ABNORMAL LOW (ref 12.0–15.0)
MCH: 27.5 pg (ref 26.0–34.0)
MCHC: 32.7 g/dL (ref 30.0–36.0)
MCV: 84.2 fL (ref 80.0–100.0)
Platelets: 224 10*3/uL (ref 150–400)
RBC: 2.98 MIL/uL — ABNORMAL LOW (ref 3.87–5.11)
RDW: 13.9 % (ref 11.5–15.5)
WBC: 12.1 10*3/uL — ABNORMAL HIGH (ref 4.0–10.5)
nRBC: 0 % (ref 0.0–0.2)

## 2022-12-26 MED ORDER — NIFEDIPINE ER OSMOTIC RELEASE 30 MG PO TB24
30.0000 mg | ORAL_TABLET | Freq: Every day | ORAL | Status: DC
  Administered 2022-12-26 – 2022-12-28 (×3): 30 mg via ORAL
  Filled 2022-12-26 (×3): qty 1

## 2022-12-26 NOTE — Progress Notes (Signed)
Patient has been eager to spend time with baby. She has been in the nursery since 9am taking care, bonding and feeding baby. Patient has appeared appropriate in a lot of her answers and seems to be comfortable in the care of her child and shows she wants to be in her baby's life as much as she can

## 2022-12-26 NOTE — Progress Notes (Signed)
Postop Day  1  Subjective: no complaints, up ad lib, voiding, tolerating PO, and + flatus Margarette is tearful and upset that her baby is not able to stay in the room with her. She states she does not understand why they are taking her baby when this baby has nothing to do with the other open CPS case with her other 2 children.   Doing well, no concerns. Ambulating without difficulty, pain managed with PO meds, tolerating regular diet, and voiding without difficulty.   No fever/chills, chest pain, shortness of breath, nausea/vomiting, or leg pain. No nipple or breast pain. No headache, visual changes, or RUQ/epigastric pain.  Objective: BP 127/84 (BP Location: Left Arm)   Pulse 75   Temp 98.5 F (36.9 C) (Oral)   Resp 19   Ht 5\' 4"  (1.626 m)   Wt 95 kg   LMP 03/27/2022 (Within Days)   SpO2 100%   BMI 35.95 kg/m    Physical Exam:  General: alert, cooperative, and appears stated age Breasts: soft/nontender CV: RRR Pulm: nl effort, CTABL Abdomen: soft, non-tender, active bowel sounds Uterine Fundus: firm Incision: healing well, no significant drainage, no dehiscence, no significant erythema Perineum: minimal edema, intact Lochia: appropriate DVT Evaluation: No evidence of DVT seen on physical exam. Negative Homan's sign. No cords or calf tenderness. No significant calf/ankle edema.  Recent Labs    12/24/22 0908 12/26/22 0537  HGB 10.6* 8.2*  HCT 32.8* 25.1*  WBC 10.8* 12.1*  PLT 254 224    Assessment/Plan: 32 y.o. G3P2002 postpartum day # 1  -Continue routine postpartum care -Lactation consult PRN for breastfeeding Encouraged snug fitting bra, cold application, Tylenol PRN, and cabbage leaves for engorgement for formula feeding  -Discussed contraceptive options including implant, IUDs hormonal and non-hormonal, injection, pills/ring/patch, condoms, and NFP.  -Acute blood loss anemia - hemodynamically stable and asymptomatic; start PO ferrous sulfate BID with stool  softeners  -Immunization status:   all immunizations up to date   Disposition: Continue inpatient postpartum care   LOS: 1 day    ----- Chari Manning Certified Nurse Midwife [facility]

## 2022-12-26 NOTE — Anesthesia Postprocedure Evaluation (Signed)
Anesthesia Post Note  Patient: [name]  Procedure(s) Performed: REPEAT CESAREAN SECTION, KELOID EXCISION  Patient location during evaluation: Mother Baby Anesthesia Type: Spinal Level of consciousness: awake and alert and oriented Pain management: pain level controlled Vital Signs Assessment: post-procedure vital signs reviewed and stable Respiratory status: respiratory function stable Cardiovascular status: stable Postop Assessment: no headache, no backache, patient able to bend at knees, no apparent nausea or vomiting, able to ambulate and adequate PO intake Anesthetic complications: no   No notable events documented.   Last Vitals:  Vitals:   12/25/22 2108 12/26/22 0032  BP: (!) 117/92 (!) 119/90  Pulse: 85 80  Resp: 19 18  Temp: 36.8 C 36.9 C  SpO2: 100% 100%    Last Pain:  Vitals:   12/26/22 0545  TempSrc:   PainSc: 0-No pain                 Zachary George

## 2022-12-26 NOTE — Progress Notes (Signed)
At patient's bedside, patient is tearful, sad, and feeling hopeless since CPS came to see her this morning. She reports that she has completed all of the requirements put in place by CPS in order to regain custody of her other 2 children as well as her newborn. She states that she has kept her job for almost a year now, has passed drug screening and has a place for she and baby to stay while she waits for her housing in [place]. Patient states she is also willing to go to a women's shelter that would accept her and the baby. Patient denies wanting to die or harm herself or anyone else at this time. Psych and TOC consult placed.   Chari Manning CNM

## 2022-12-26 NOTE — Progress Notes (Signed)
Was able to be supportive emotionally of patient today coming from a tough situation and a report from previous nurse about her situation with baby possible being taken away. Patient was receptive to comfort and encouragement and was eager to see baby in the nursery. She showed appropriate bonding and has taken great care of her baby while able to (cleaning her own bottles, changing diapers, feeding baby etc). She spent the majority of the day in the nursery but came back very upset based on the call she got from DSS stating no one was willing to take her and baby in their home. This RN has since called CPS to figure out what the update was and CPS said there would be more updates in the morning.  Patient was making statements out of emotion that she felt she wanted to "give up on life" and "had nothing to live for." The [employer] was notified as well as charge that RN was concerned about her mental wellbeing and even though she hadn't stated she wanted to harm herself, we just wanted to take precautions to get her the support she needed. Sunny Schlein and Asher Muir went to talk to her and were able to also help calm her and give her reassurance. Another TOC is put in to see if there are better resources to get her housing and her other needs taken care of.

## 2022-12-27 DIAGNOSIS — F4329 Adjustment disorder with other symptoms: Secondary | ICD-10-CM

## 2022-12-27 NOTE — Progress Notes (Signed)
Postop Day  2  Subjective: no complaints, up ad lib, voiding, tolerating PO, and + flatus  Doing well, no concerns. Ambulating without difficulty, pain managed with PO meds, tolerating regular diet, and voiding without difficulty.   No fever/chills, chest pain, shortness of breath, nausea/vomiting, or leg pain. No nipple or breast pain. No headache, visual changes, or RUQ/epigastric pain.  Objective: BP 123/88 (BP Location: Left Arm)   Pulse 99   Temp 99.1 F (37.3 C) (Oral)   Resp 20   Ht 5\' 4"  (1.626 m)   Wt 95 kg   LMP 03/27/2022 (Within Days)   SpO2 98%   BMI 35.95 kg/m    Physical Exam:  General: alert, cooperative, and appears stated age Breasts: soft/nontender CV: RRR Pulm: nl effort, CTABL Abdomen: soft, non-tender, active bowel sounds Uterine Fundus: firm Incision: healing well, no significant drainage, no dehiscence, no significant erythema Lochia: appropriate DVT Evaluation: No evidence of DVT seen on physical exam. Negative Homan's sign. No cords or calf tenderness. No significant calf/ankle edema.  Recent Labs    12/26/22 0537  HGB 8.2*  HCT 25.1*  WBC 12.1*  PLT 224    Assessment/Plan: 32 y.o. G3P2002 postpartum day # 2  -Continue routine postpartum care -Lactation consult PRN for breastfeeding Encouraged snug fitting bra, cold application, Tylenol PRN, and cabbage leaves for engorgement for formula feeding  -Discussed contraceptive options including implant, IUDs hormonal and non-hormonal, injection, pills/ring/patch, condoms, and NFP.  -Acute blood loss anemia - hemodynamically stable and asymptomatic; start PO ferrous sulfate BID with stool softeners  -Immunization status:   all immunizations up to date   Disposition: Continue inpatient postpartum care   LOS: 2 days   ----- Chari Manning Certified Nurse Midwife [facility]

## 2022-12-27 NOTE — Consult Note (Signed)
[facility] Face-to-Face Psychiatry Consult   Reason for Consult:  depression Referring Physician:  Dr Feliberto Gottron Patient Identification: [name] MRN:  [id number] Principal Diagnosis: Previous cesarean delivery affecting pregnancy Diagnosis:  Principal Problem:   Previous cesarean delivery affecting pregnancy Active Problems:   Adjustment disorder with disturbance of emotion   Total Time spent with patient: 1 hour  Subjective:   [name] is a 32 y.o. female patient admitted with the birth of her child, issues with CPS.  HPI:  32 yo female presented for the birth of her baby, consult placed for depression related to CPS issues and not having custody of her children.  On assessment, [name] is pleasant and calm, holding her baby to her chest in the nursery, baby appropriately swaddled and calm.  She seems very competent in caring for her baby with the nurses reporting she manages the feedings, bathing, and diaper changes.  Her nurse, Orpha Bur, has been in the nursery the past couple of days with her and stated, "She's great, very involved and appropriate in caring for her baby."    On assessment, [name] stated "I don't have no depression.  I got my kids to live for".  She has a 1 and 30 yo son who were taken last April by CPS and she seems them every Monday unsupervised with a court date next month in the hopes to get them back.  She has some anxiety as she was told by Lurena Joiner, her case worker at [employer]PS, that she was told that she would be able to take her baby home for a month while they evaluated how she was managing the baby.  [name] is living with a friend of her cousins who is agreeable to have the baby come live there with her.  She has a bassinet and clothes there, ready for the baby.  No suicidal ideations, panic attacks, mania symptoms, substance use, hallucinations, or other concerning mental health issues.  Psych cleared.  [name] reported being upset about how things were handled  yesterday as two staff members told her they were taking her baby from her room for evaluation in the nursery, which was not the case.  As CPS came in shortly and told her that she could not have the baby in her room and would not be taking her home.  They did not have any papers that stated this which her attorney today told her they cannot do this without appropriate paperwork.  Regardless of being upset yesterday, she acted appropriately.  She is now more distrustful of the system.  Evidently, there were not problems until last year when new staff started with CPS.    [name] stated, "I just want to be there for my baby".  Past Psychiatric History: depression, IDD  Risk to Self:  none Risk to Others:  none Prior Inpatient Therapy:  none Prior Outpatient Therapy:  none  Past Medical History:  Past Medical History:  Diagnosis Date   Anemia    Depression    Herpes labialis    Mild mental retardation    PONV (postoperative nausea and vomiting)     Past Surgical History:  Procedure Laterality Date   CESAREAN SECTION  10/19/2014   CESAREAN SECTION N/A 09/12/2021   Procedure: REPEAT CESAREAN SECTION;  Surgeon: Schermerhorn, Ihor Austin, MD;  Location: [facility] ORS;  Service: Obstetrics;  Laterality: N/A;   CESAREAN SECTION N/A 12/25/2022   Procedure: REPEAT CESAREAN SECTION, KELOID EXCISION;  Surgeon: Suzy Bouchard, MD;  Location:  [facility] ORS;  Service: Obstetrics;  Laterality: N/A;   Family History: History reviewed. No pertinent family history. Family Psychiatric  History: none Social History:  Social History   Substance and Sexual Activity  Alcohol Use Not Currently     Social History   Substance and Sexual Activity  Drug Use Never    Social History   Socioeconomic History   Marital status: Media planner    Spouse name: NA   Number of children: 2   Years of education: 12   Highest education level: High school graduate  Occupational History   Not on file  Tobacco Use    Smoking status: Never   Smokeless tobacco: Never  Vaping Use   Vaping Use: Never used  Substance and Sexual Activity   Alcohol use: Not Currently   Drug use: Never   Sexual activity: Yes    Birth control/protection: Injection  Other Topics Concern   Not on file  Social History Narrative   ** Merged History Encounter **       Patient is currently pregnant and has a 6yo son. She and her son are currently living with patient's brother which she reports is an uncomfortable environment. Patient reports not having a support system since th epassing of her mom in March 2022.    Social Determinants of Health   Financial Resource Strain: Not on file  Food Insecurity: No Food Insecurity (12/25/2022)   Hunger Vital Sign    Worried About Running Out of Food in the Last Year: Never true    Ran Out of Food in the Last Year: Never true  Transportation Needs: No Transportation Needs (12/25/2022)   PRAPARE - Administrator, Civil Service (Medical): No    Lack of Transportation (Non-Medical): No  Physical Activity: Not on file  Stress: Not on file  Social Connections: Not on file   Additional Social History:    Allergies:  No Known Allergies  Labs:  Results for orders placed or performed during the hospital encounter of 12/25/22 (from the past 48 hour(s))  CBC     Status: Abnormal   Collection Time: 12/26/22  5:37 AM  Result Value Ref Range   WBC 12.1 (H) 4.0 - 10.5 K/uL   RBC 2.98 (L) 3.87 - 5.11 MIL/uL   Hemoglobin 8.2 (L) 12.0 - 15.0 g/dL   HCT 54.0 (L) 08.6 - 76.1 %   MCV 84.2 80.0 - 100.0 fL   MCH 27.5 26.0 - 34.0 pg   MCHC 32.7 30.0 - 36.0 g/dL   RDW 95.0 93.2 - 67.1 %   Platelets 224 150 - 400 K/uL   nRBC 0.0 0.0 - 0.2 %    Comment: Performed at [facility], [address], [place], [place] [address]    Current Facility-Administered Medications  Medication Dose Route Frequency Provider Last Rate Last Admin   coconut oil  1 Application Topical PRN  Schermerhorn, Ihor Austin, MD       witch hazel-glycerin (TUCKS) pad 1 Application  1 Application Topical PRN Schermerhorn, Ihor Austin, MD       And   dibucaine (NUPERCAINAL) 1 % rectal ointment 1 Application  1 Application Rectal PRN Schermerhorn, Ihor Austin, MD       diphenhydrAMINE (BENADRYL) injection 12.5 mg  12.5 mg Intravenous Q4H PRN Foye Deer, MD       Or   diphenhydrAMINE (BENADRYL) capsule 25 mg  25 mg Oral Q4H PRN Foye Deer, MD  enoxaparin (LOVENOX) injection 40 mg  40 mg Subcutaneous Q24H Schermerhorn, Ihor Austin, MD   40 mg at 12/26/22 0840   gabapentin (NEURONTIN) capsule 300 mg  300 mg Oral QHS Schermerhorn, Ihor Austin, MD   300 mg at 12/26/22 2141   ibuprofen (ADVIL) tablet 600 mg  600 mg Oral Q6H Gustavo Lah, CNM   600 mg at 12/27/22 8657   lactated ringers infusion   Intravenous Continuous Schermerhorn, Ihor Austin, MD   Stopped at 12/25/22 2032   menthol-cetylpyridinium (CEPACOL) lozenge 3 mg  1 lozenge Oral Q2H PRN Schermerhorn, Ihor Austin, MD       morphine (PF) 2 MG/ML injection 1-2 mg  1-2 mg Intravenous Q3H PRN Schermerhorn, Ihor Austin, MD       naloxone [facility]) injection 0.4 mg  0.4 mg Intravenous PRN Foye Deer, MD       And   sodium chloride flush (NS) 0.9 % injection 3 mL  3 mL Intravenous PRN Foye Deer, MD       naloxone HCl [facility]) 2 mg in dextrose 5 % 250 mL infusion  1-4 mcg/kg/hr Intravenous Continuous PRN Foye Deer, MD       NIFEdipine (PROCARDIA-XL/NIFEDICAL-XL) 24 hr tablet 30 mg  30 mg Oral Daily Chari Manning Lucy Lorena, CNM   30 mg at 12/26/22 1114   ondansetron (ZOFRAN) injection 4 mg  4 mg Intravenous Q8H PRN Foye Deer, MD       oxyCODONE (Oxy IR/ROXICODONE) immediate release tablet 5 mg  5 mg Oral Q6H PRN Foye Deer, MD   5 mg at 12/26/22 1805   prenatal multivitamin tablet 1 tablet  1 tablet Oral Q1200 Schermerhorn, Ihor Austin, MD   1 tablet at 12/26/22 1114    scopolamine (TRANSDERM-SCOP) 1 MG/3DAYS 1.5 mg  1 patch Transdermal Once Foye Deer, MD       senna-docusate (Senokot-S) tablet 2 tablet  2 tablet Oral Daily Schermerhorn, Ihor Austin, MD   2 tablet at 12/26/22 1114   simethicone (MYLICON) chewable tablet 80 mg  80 mg Oral TID PC Schermerhorn, Ihor Austin, MD   80 mg at 12/26/22 1805   simethicone (MYLICON) chewable tablet 80 mg  80 mg Oral PRN Schermerhorn, Ihor Austin, MD       sodium citrate-citric acid (ORACIT) solution 30 mL  30 mL Oral 30 min Pre-Op Schermerhorn, Ihor Austin, MD       zolpidem (AMBIEN) tablet 5 mg  5 mg Oral QHS PRN Schermerhorn, Ihor Austin, MD        Musculoskeletal: Strength & Muscle Tone: within normal limits Gait & Station: normal Patient leans: N/A  Psychiatric Specialty Exam: Physical Exam Vitals and nursing note reviewed.  Constitutional:      Appearance: Normal appearance.  HENT:     Head: Normocephalic.     Nose: Nose normal.  Pulmonary:     Effort: Pulmonary effort is normal.  Musculoskeletal:        General: Normal range of motion.     Cervical back: Normal range of motion.  Neurological:     General: No focal deficit present.     Mental Status: She is alert and oriented to person, place, and time.  Psychiatric:        Attention and Perception: Attention and perception normal.        Mood and Affect: Mood is anxious.        Speech: Speech normal.  Behavior: Behavior normal. Behavior is cooperative.        Thought Content: Thought content normal.        Cognition and Memory: Cognition and memory normal.        Judgment: Judgment normal.     Review of Systems  Psychiatric/Behavioral:  The patient is nervous/anxious.   All other systems reviewed and are negative.   Blood pressure 123/88, pulse 99, temperature 99.1 F (37.3 C), temperature source Oral, resp. rate 20, height  (1.626 m), weight 95 kg, last menstrual period 03/27/2022, SpO2 98 %, not currently breastfeeding.Body mass  index is 35.95 kg/m.  General Appearance: Casual  Eye Contact:  Good  Speech:  Normal Rate  Volume:  Normal  Mood:  Anxious  Affect:  Congruent  Thought Process:  Coherent  Orientation:  Full (Time, Place, and Person)  Thought Content:  WDL and Logical  Suicidal Thoughts:  No  Homicidal Thoughts:  No  Memory:  Immediate;   Good Recent;   Good Remote;   Good  Judgement:  Good  Insight:  Good  Psychomotor Activity:  Normal  Concentration:  Concentration: Good and Attention Span: Good  Recall:  Good  Fund of Knowledge:  Fair  Language:  Good  Akathisia:  No  Handed:  Right  AIMS (if indicated):     Assets:  Housing Leisure Time Physical Health Resilience Social Support  ADL's:  Intact  Cognition:  Impaired,  Mild  Sleep:        Physical Exam: Physical Exam Vitals and nursing note reviewed.  Constitutional:      Appearance: Normal appearance.  HENT:     Head: Normocephalic.     Nose: Nose normal.  Pulmonary:     Effort: Pulmonary effort is normal.  Musculoskeletal:        General: Normal range of motion.     Cervical back: Normal range of motion.  Neurological:     General: No focal deficit present.     Mental Status: She is alert and oriented to person, place, and time.  Psychiatric:        Attention and Perception: Attention and perception normal.        Mood and Affect: Mood is anxious.        Speech: Speech normal.        Behavior: Behavior normal. Behavior is cooperative.        Thought Content: Thought content normal.        Cognition and Memory: Cognition and memory normal.        Judgment: Judgment normal.    Review of Systems  Psychiatric/Behavioral:  The patient is nervous/anxious.   All other systems reviewed and are negative.  Blood pressure 123/88, pulse 99, temperature 99.1 F (37.3 C), temperature source Oral, resp. rate 20, height  (1.626 m), weight 95 kg, last menstrual period 03/27/2022, SpO2 98 %, not currently breastfeeding. Body  mass index is 35.95 kg/m.  Treatment Plan Summary: Adjustment disorder with disturbance of emotions No medications needed.  Disposition: No evidence of imminent risk to self or others at present.   Patient does not meet criteria for psychiatric inpatient admission. Supportive therapy provided about ongoing stressors.  Nanine Means, NP 12/27/2022 9:49 AM

## 2022-12-27 NOTE — TOC Progression Note (Signed)
Transition of Care [facility]) - Progression Note    Patient Details  Name: [name] MRN: [id number] Date of Birth: [birth year]  [facility] CM/SW Contact  Allena Katz, LCSW Phone Number: 12/27/2022, 4:38 PM  Clinical Narrative:   Received email from DSS that patients baby is now in DSS custody.     Expected Discharge Plan: Home/Self Care Barriers to Discharge: Continued Medical Work up  Expected Discharge Plan and Services       Living arrangements for the past 2 months: Single Family Home                                       Social Determinants of Health (SDOH) Interventions SDOH Screenings   Food Insecurity: No Food Insecurity (12/25/2022)  Housing: Low Risk  (12/25/2022)  Transportation Needs: No Transportation Needs (12/25/2022)  Utilities: Not At Risk (12/25/2022)  Depression (PHQ2-9): Low Risk  (05/07/2022)  Tobacco Use: Low Risk  (12/26/2022)    Readmission Risk Interventions     No data to display

## 2022-12-28 MED ORDER — FERROUS SULFATE 325 (65 FE) MG PO TABS: 325.0000 mg | ORAL_TABLET | Freq: Two times a day (BID) | ORAL | Status: DC

## 2022-12-28 MED ORDER — MEDROXYPROGESTERONE ACETATE 150 MG/ML IM SUSP
150.0000 mg | INTRAMUSCULAR | Status: DC
  Administered 2022-12-28: 150 mg via INTRAMUSCULAR
  Filled 2022-12-28: qty 1

## 2022-12-28 MED ORDER — FERROUS SULFATE 325 (65 FE) MG PO TABS: 325.0000 mg | ORAL_TABLET | Freq: Two times a day (BID) | ORAL | 3 refills | Status: AC

## 2022-12-28 MED ORDER — SODIUM CHLORIDE 0.9 % IV SOLN
300.0000 mg | INTRAVENOUS | Status: DC
  Filled 2022-12-28: qty 15

## 2022-12-28 MED ORDER — MEDROXYPROGESTERONE ACETATE 150 MG/ML IM SUSP: 150.0000 mg | INTRAMUSCULAR | 4 refills | Status: AC

## 2022-12-28 MED ORDER — ENOXAPARIN SODIUM 60 MG/0.6ML IJ SOSY: 0.5000 mg/kg | PREFILLED_SYRINGE | INTRAMUSCULAR | Status: DC

## 2022-12-28 MED ORDER — SIMETHICONE 80 MG PO CHEW: 80.0000 mg | CHEWABLE_TABLET | Freq: Four times a day (QID) | ORAL | 0 refills | Status: DC | PRN

## 2022-12-28 MED ORDER — BACITRACIN ZINC 500 UNIT/GM EX OINT
TOPICAL_OINTMENT | Freq: Two times a day (BID) | CUTANEOUS | Status: DC
  Filled 2022-12-28: qty 0.9

## 2022-12-28 MED ORDER — SENNOSIDES-DOCUSATE SODIUM 8.6-50 MG PO TABS: 2.0000 | ORAL_TABLET | Freq: Every evening | ORAL | 0 refills | Status: DC | PRN

## 2022-12-28 MED ORDER — IBUPROFEN 600 MG PO TABS: 600.0000 mg | ORAL_TABLET | Freq: Four times a day (QID) | ORAL | 0 refills | Status: DC | PRN

## 2022-12-28 MED ORDER — OXYCODONE HCL 5 MG PO TABS: 5.0000 mg | ORAL_TABLET | Freq: Four times a day (QID) | ORAL | 0 refills | Status: AC | PRN

## 2022-12-28 MED ORDER — BACITRACIN ZINC 500 UNIT/GM EX OINT: TOPICAL_OINTMENT | Freq: Two times a day (BID) | CUTANEOUS | 0 refills | Status: DC

## 2022-12-28 MED ORDER — NIFEDIPINE ER 30 MG PO TB24: 30.0000 mg | ORAL_TABLET | Freq: Every day | ORAL | 1 refills | Status: AC

## 2022-12-28 NOTE — Progress Notes (Signed)
Patient discharged home with cousin, [name], who was present for all discharge instructions. [name], pt legal guardian, notified of pending discharge via phone call. Escorted out by staff.

## 2022-12-28 NOTE — Discharge Instructions (Signed)
Discharge Instructions:   If there are any new medications, they have been ordered and will be available for pickup at the listed pharmacy on your way home from the hospital.   Call office if you have any of the following: headache, visual changes, fever >101.0 F, chills, shortness of breath, breast concerns, excessive vaginal bleeding, incision drainage or problems, leg pain or redness, depression or any other concerns. If you have vaginal discharge with an odor, let your doctor know.   It is normal to bleed for up to 6 weeks. You should not soak through more than 1 pad in 1 hour. If you have a blood clot larger than your fist with continued bleeding, call your doctor.   After a c-section, you should expect a small amount of blood or clear fluid coming from the incision and abdominal cramping/soreness. Inspect your incision site daily. Stand in front of a mirror to look for any redness, incision opening, or discolored/odorness drainage. Take a shower daily and continue good hygiene. Use own towel and washcloth (do not share). Make sure your sheets on your bed are clean. No pets sleeping around your incision site. Dressing will be removed at your postpartum visit. If the dressing does become wet or soiled underneath, it is okay to remove it.   Activity: Do not lift > 10 lbs for 6 weeks (do not lift anything heavier than your baby). No intercourse, tampons, swimming pools, hot tubs, baths (only showers) for 6 weeks.  No driving for 1-2 weeks. Continue prenatal vitamin, especially if breastfeeding. Increase calories and fluids (water) while breastfeeding.   Your milk will come in, in the next couple of days (right now it is colostrum). You may have a slight fever when your milk comes in, but it should go away on its own.  If it does not, and rises above 101 F please call the doctor. You will also feel achy and your breasts will be firm. They will also start to leak. If you are breastfeeding, continue  as you have been and you can pump/express milk for comfort.   If you have too much milk, your breasts can become engorged, which could lead to mastitis. This is an infection of the milk ducts. It can be very painful and you will need to notify your doctor to obtain a prescription for antibiotics. You can also treat it with a shower or hot/cold compress.   Postpartum blues (feelings of happy one minute and sad another minute) are normal for the first few weeks but if it gets worse let your doctor know.

## 2023-01-17 ENCOUNTER — Encounter: Payer: Self-pay | Admitting: Obstetrics and Gynecology

## 2023-01-20 ENCOUNTER — Telehealth: Payer: Self-pay

## 2023-01-20 NOTE — Telephone Encounter (Signed)

## 2023-03-11 ENCOUNTER — Emergency Department
Admission: EM | Admit: 2023-03-11 | Discharge: 2023-03-11 | Disposition: A | Discharge status: Home / Self Care | Payer: Medicare Other | Attending: Emergency Medicine | Admitting: Emergency Medicine

## 2023-03-11 ENCOUNTER — Other Ambulatory Visit: Payer: Self-pay

## 2023-03-11 ENCOUNTER — Encounter: Payer: Self-pay | Admitting: Emergency Medicine

## 2023-03-11 DIAGNOSIS — R42 Dizziness and giddiness: Secondary | ICD-10-CM | POA: Insufficient documentation

## 2023-03-11 DIAGNOSIS — R519 Headache, unspecified: Secondary | ICD-10-CM | POA: Diagnosis not present

## 2023-03-11 LAB — CBC
HCT: 36.2 % (ref 36.0–46.0)
Hemoglobin: 11.3 g/dL — ABNORMAL LOW (ref 12.0–15.0)
MCH: 26 pg (ref 26.0–34.0)
MCHC: 31.2 g/dL (ref 30.0–36.0)
MCV: 83.4 fL (ref 80.0–100.0)
Platelets: 334 10*3/uL (ref 150–400)
RBC: 4.34 MIL/uL (ref 3.87–5.11)
RDW: 14.4 % (ref 11.5–15.5)
WBC: 7.2 10*3/uL (ref 4.0–10.5)
nRBC: 0 % (ref 0.0–0.2)

## 2023-03-11 LAB — POC URINE PREG, ED: Preg Test, Ur: NEGATIVE

## 2023-03-11 LAB — URINALYSIS, ROUTINE W REFLEX MICROSCOPIC
Bilirubin Urine: NEGATIVE
Glucose, UA: NEGATIVE mg/dL
Ketones, ur: NEGATIVE mg/dL
Nitrite: NEGATIVE
Protein, ur: 30 mg/dL — AB
Specific Gravity, Urine: 1.023 (ref 1.005–1.030)
pH: 5 (ref 5.0–8.0)

## 2023-03-11 LAB — BASIC METABOLIC PANEL
Anion gap: 7 (ref 5–15)
BUN: 14 mg/dL (ref 6–20)
CO2: 19 mmol/L — ABNORMAL LOW (ref 22–32)
Calcium: 8.6 mg/dL — ABNORMAL LOW (ref 8.9–10.3)
Chloride: 109 mmol/L (ref 98–111)
Creatinine, Ser: 0.81 mg/dL (ref 0.44–1.00)
GFR, Estimated: >60 mL/min (ref >60)
Glucose, Bld: 104 mg/dL — ABNORMAL HIGH (ref 70–99)
Potassium: 3.6 mmol/L (ref 3.5–5.1)
Sodium: 135 mmol/L (ref 135–145)

## 2023-03-11 MED ORDER — ONDANSETRON 4 MG PO TBDP: 4.0000 mg | ORAL_TABLET | Freq: Three times a day (TID) | ORAL | 0 refills | Status: DC | PRN

## 2023-03-11 MED ORDER — MECLIZINE HCL 25 MG PO TABS: 25.0000 mg | ORAL_TABLET | Freq: Three times a day (TID) | ORAL | 0 refills | Status: AC | PRN

## 2023-03-11 NOTE — ED Notes (Signed)
Patient discharged to home per MD order. Patient in stable condition, and deemed medically cleared by ED provider for discharge. Discharge instructions reviewed with patient/family using "Teach Back"; verbalized understanding of medication education and administration, and information about follow-up care. Denies further concerns. ° °

## 2023-03-11 NOTE — ED Provider Notes (Signed)
[facility] Provider Note    Event Date/Time   First MD Initiated Contact with Patient 03/11/23 [phone]     (approximate)   History   Headache and Dizziness   HPI  [name] is a 32 y.o. female  with no sig pmh who presents with dizziness. Patient describes developing sensation of room spinning while at work today. Denies headache to me. No neuro deficits. No nausea/ or vomiting. No hx of vertigo. Feeling improved now. No cp or palpitations       Physical Exam   Triage Vital Signs: ED Triage Vitals  Enc Vitals Group     BP 03/11/23 0727 (!) 136/102     Pulse Rate 03/11/23 0727 80     Resp 03/11/23 0727 16     Temp 03/11/23 0727 98.4 F (36.9 C)     Temp Source 03/11/23 0727 Oral     SpO2 03/11/23 0727 95 %     Weight 03/11/23 0728 89.8 kg (198 lb)     Height 03/11/23 0728 1.626 m (5\' 4" )     Head Circumference --      Peak Flow --      Pain Score 03/11/23 0728 10     Pain Loc --      Pain Edu? --      Excl. in GC? --     Most recent vital signs: Vitals:   03/11/23 0727 03/11/23 0807  BP: (!) 136/102 (!) 130/99  Pulse: 80 78  Resp: 16 16  Temp: 98.4 F (36.9 C)   SpO2: 95% 98%     General: Awake, no distress.  CV:  Good peripheral perfusion.  Resp:  Normal effort.  Abd:  No distention.  Other:  CN 2-12 normal, normal strength in all ext. No disdiadochokinesia. Eomi, perrla, no nystagmus   ED Results / Procedures / Treatments   Labs (all labs ordered are listed, but only abnormal results are displayed) Labs Reviewed  BASIC METABOLIC PANEL - Abnormal; Notable for the following components:      Result Value   CO2 19 (*)    Glucose, Bld 104 (*)    Calcium 8.6 (*)    All other components within normal limits  CBC - Abnormal; Notable for the following components:   Hemoglobin 11.3 (*)    All other components within normal limits  URINALYSIS, ROUTINE W REFLEX MICROSCOPIC - Abnormal; Notable for the following components:    Color, Urine YELLOW (*)    APPearance HAZY (*)    Hgb urine dipstick SMALL (*)    Protein, ur 30 (*)    Leukocytes,Ua MODERATE (*)    Bacteria, UA FEW (*)    All other components within normal limits  POC URINE PREG, ED     EKG  ED ECG REPORT I, Jene Every, the attending physician, personally viewed and interpreted this ECG.  Date: 03/11/2023 EKG Time:  Rate:  Rhythm: normal sinus rhythm QRS Axis: normal Intervals: normal ST/T Wave abnormalities: normal Narrative Interpretation: no evidence of acute ischemia   RADIOLOGY     PROCEDURES:  Critical Care performed:   Procedures   MEDICATIONS ORDERED IN ED: Medications - No data to display   IMPRESSION / MDM / ASSESSMENT AND PLAN / ED COURSE  I reviewed the triage vital signs and the nursing notes. Patient's presentation is most consistent with acute presentation with potential threat to life or bodily function.  Patient presents with vertigo sx's as detailed above.  Differential includes peripheral vertigo, less likely central vertigo, lightheadedness  Well appearing and asymptomatic here now. REassuring exam and normal neuro exam.  Most consistent with peripheral vertigo  Appropriate for d/c with meclizine, zofran, outpatient follow up as needed Return precautions discussed        FINAL CLINICAL IMPRESSION(S) / ED DIAGNOSES   Final diagnoses:  Vertigo     Rx / DC Orders   ED Discharge Orders          Ordered    meclizine (ANTIVERT) 25 MG tablet  3 times daily PRN        03/11/23 0808    ondansetron (ZOFRAN-ODT) 4 MG disintegrating tablet  Every 8 hours PRN        03/11/23 0808             Note:  This document was prepared using Dragon voice recognition software and may include unintentional dictation errors.   Jene Every, MD 03/11/23 [phone]

## 2023-03-11 NOTE — ED Triage Notes (Signed)
Pt to ED via POV, pt is c/o headache and lightheadedness that started yesterday. Pt denies N/V. Pt states that when she get light headed she feels like her heart is fluttering. Pt is currently in NAD.

## 2023-03-11 NOTE — ED Notes (Signed)
See triage note  Presents with headache and dizziness

## 2023-04-20 ENCOUNTER — Encounter: Payer: Self-pay | Admitting: Intensive Care

## 2023-04-20 ENCOUNTER — Emergency Department
Admission: EM | Admit: 2023-04-20 | Discharge: 2023-04-20 | Disposition: A | Discharge status: Home / Self Care | Payer: Medicare Other | Attending: Student in an Organized Health Care Education/Training Program | Admitting: Student in an Organized Health Care Education/Training Program

## 2023-04-20 ENCOUNTER — Other Ambulatory Visit: Payer: Self-pay

## 2023-04-20 DIAGNOSIS — L02214 Cutaneous abscess of groin: Secondary | ICD-10-CM | POA: Diagnosis present

## 2023-04-20 DIAGNOSIS — L739 Follicular disorder, unspecified: Secondary | ICD-10-CM | POA: Diagnosis not present

## 2023-04-20 HISTORY — DX: Dizziness and giddiness: R42

## 2023-04-20 MED ORDER — DOXYCYCLINE MONOHYDRATE 100 MG PO TABS: 100.0000 mg | ORAL_TABLET | Freq: Two times a day (BID) | ORAL | 0 refills | Status: AC

## 2023-04-20 MED ORDER — CHLORHEXIDINE GLUCONATE 4 % EX SOLN: Freq: Every day | CUTANEOUS | 0 refills | Status: DC | PRN

## 2023-04-20 MED ORDER — CEPHALEXIN 500 MG PO CAPS: 500.0000 mg | ORAL_CAPSULE | Freq: Four times a day (QID) | ORAL | 0 refills | Status: AC

## 2023-04-20 NOTE — Discharge Instructions (Addendum)
Please take the antibiotics as prescribed to help with your infection.  You may also use the prescribed soap to help keep this area clean.  Please return if you develop any worsening swelling, redness, or if you develop a fever or chills.  It was a pleasure caring for you today.

## 2023-04-20 NOTE — ED Triage Notes (Signed)
Patient c/o abscess in left groin area that started yesterday. Reports swelling and redness

## 2023-04-20 NOTE — ED Provider Notes (Signed)
[facility] Provider Note    Event Date/Time   First MD Initiated Contact with Patient 04/20/23 1041     (approximate)   History   Abscess (/)   HPI  [name] is a 32 y.o. female who presents today for evaluation of left groin irritation.  Patient reports that she does got a wax, and she has noticed some irritation along the hair follicles.  She is wondering if she has an infection.  She has not noticed any focal area of swelling or fluctuance.  She has not had any fevers or chills.  No urinary symptoms.  She was told to come to the emergency department by someone that she works with.  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   Uterine contractions 12/24/2022   NST (non-stress test) nonreactive 12/19/2022   Abdominal pain during pregnancy in third trimester 11/08/2022   Previous cesarean delivery affecting pregnancy 09/12/2021   Postoperative state 09/12/2021   Sciatic pain 08/03/2021   Uterine size date discrepancy, second trimester 08/03/2021   Abdominal cramping affecting pregnancy 06/12/2021   Encounter for supervision of other normal pregnancy, third trimester 02/15/2021   Intermittent palpitations 06/25/2017   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          Physical Exam   Triage Vital Signs: ED Triage Vitals  Encounter Vitals Group     BP 04/20/23 0956 (!) 139/96     Systolic BP Percentile --      Diastolic BP Percentile --      Pulse Rate 04/20/23 0956 92     Resp 04/20/23 0956 18     Temp 04/20/23 0956 98.3 F (36.8 C)     Temp Source 04/20/23 0956 Oral     SpO2 04/20/23 0956 97 %     Weight 04/20/23 0957 198 lb (89.8 kg)     Height 04/20/23 0957 5\' 4"  (1.626 m)     Head Circumference --      Peak Flow --      Pain Score 04/20/23 0957 10     Pain Loc --      Pain Education --      Exclude from Growth Chart --     Most recent vital  signs: Vitals:   04/20/23 0956  BP: (!) 139/96  Pulse: 92  Resp: 18  Temp: 98.3 F (36.8 C)  SpO2: 97%    Physical Exam Vitals and nursing note reviewed.  Constitutional:      General: Awake and alert. No acute distress.    Appearance: Normal appearance. The patient is obese.  HENT:     Head: Normocephalic and atraumatic.     Mouth: Mucous membranes are moist.  Eyes:     General: PERRL. Normal EOMs        Right eye: No discharge.        Left eye: No discharge.     Conjunctiva/sclera: Conjunctivae normal.  Cardiovascular:     Rate and Rhythm: Normal rate and regular rhythm.     Pulses: Normal pulses.  Pulmonary:     Effort: Pulmonary effort is normal. No respiratory distress.     Breath sounds: Normal breath sounds.  Abdominal:     Abdomen is soft. There is no abdominal tenderness. No rebound or guarding. No distention. Musculoskeletal:        General: No swelling. Normal range of motion.     Cervical back: Normal  range of motion and neck supple.  Skin:    General: Skin is warm and dry.     Capillary Refill: Capillary refill takes less than 2 seconds.     Findings: Left groin area with several irritated hair follicles consistent with folliculitis, though no fluctuance, induration, or active drainage.  No focal area of swelling.  No crepitus.  No peritoneal findings. Neurological:     Mental Status: The patient is awake and alert.      ED Results / Procedures / Treatments   Labs (all labs ordered are listed, but only abnormal results are displayed) Labs Reviewed - No data to display   EKG     RADIOLOGY     PROCEDURES:  Critical Care performed:   Procedures   MEDICATIONS ORDERED IN ED: Medications - No data to display   IMPRESSION / MDM / ASSESSMENT AND PLAN / ED COURSE  I reviewed the triage vital signs and the nursing notes.   Differential diagnosis includes, but is not limited to, folliculitis, furuncle, carbuncle, cellulitis,  abscess.  Patient is awake and alert, hemodynamically stable and afebrile.  She is nontoxic in appearance.  She has several irritated appearing hair follicles in her pubic area to her left side, ever since receiving a wax.  This appears to be consistent with folliculitis.  She does not have any focal areas of fluctuance or induration, no swelling, no evidence of abscess at this time.  There is no Bartholin gland cyst or abscess.  She was started on antibiotics and Hibiclens.  We discussed the importance of keeping this area clean and dry.  I recommended close outpatient follow-up for recheck to ensure that it is healing as we expected.  We also discussed return precautions.  Patient understands and agrees with plan.  She was discharged in stable condition.  I spoke with her legal guardian, Archie Patten who understands and agrees with plan.  She reports that the patient is capable of arranging her own ride home, and patient is comfortable with this plan.  Patient's presentation is most consistent with acute illness / injury with system symptoms.    FINAL CLINICAL IMPRESSION(S) / ED DIAGNOSES   Final diagnoses:  Folliculitis     Rx / DC Orders   ED Discharge Orders          Ordered    chlorhexidine (HIBICLENS) 4 % external liquid  Daily PRN        04/20/23 1045    cephALEXin (KEFLEX) 500 MG capsule  4 times daily        04/20/23 1045    doxycycline (ADOXA) 100 MG tablet  2 times daily        04/20/23 1045             Note:  This document was prepared using Dragon voice recognition software and may include unintentional dictation errors.   Jackelyn Hoehn, PA-C 04/20/23 1457    Willy Eddy, MD 04/20/23 1538

## 2023-05-10 ENCOUNTER — Encounter: Payer: Self-pay | Admitting: Emergency Medicine

## 2023-05-10 ENCOUNTER — Emergency Department
Admission: EM | Admit: 2023-05-10 | Discharge: 2023-05-10 | Disposition: A | Discharge status: Home / Self Care | Payer: Medicare Other | Attending: Emergency Medicine | Admitting: Emergency Medicine

## 2023-05-10 ENCOUNTER — Other Ambulatory Visit: Payer: Self-pay

## 2023-05-10 DIAGNOSIS — K0889 Other specified disorders of teeth and supporting structures: Secondary | ICD-10-CM

## 2023-05-10 DIAGNOSIS — K047 Periapical abscess without sinus: Secondary | ICD-10-CM | POA: Diagnosis not present

## 2023-05-10 MED ORDER — IBUPROFEN 600 MG PO TABS: 600.0000 mg | ORAL_TABLET | Freq: Four times a day (QID) | ORAL | 0 refills | Status: DC | PRN

## 2023-05-10 MED ORDER — AMOXICILLIN 875 MG PO TABS: 875.0000 mg | ORAL_TABLET | Freq: Two times a day (BID) | ORAL | 0 refills | Status: DC

## 2023-05-10 NOTE — ED Triage Notes (Signed)
Pt via POV from home. Pt c/o R sided dental pain for the past couple of weeks. States she felt like her jaw is swollen. Reports she is unable to get seen by a dentist. Pt is A&Ox4 and NAD

## 2023-05-10 NOTE — Discharge Instructions (Signed)
OPTIONS FOR DENTAL FOLLOW UP CARE ° °[place] Department of Health and Human Services - Local Safety Net Dental Clinics °http://www.ncdhhs.gov/dph/oralhealth/services/safetynetclinics.htm °  °[facility] (336-562-3123) ° °[facility] [place] ([phone]) ° °[facility] ([phone] ext [phone]) ° °[facility] ([phone]) ° °[facility] ([phone]) °This clinic caters to the indigent population and is on a lottery system. °Location: °[facility], [address], [place] °Clinic Hours: °Wednesdays from 6pm - 9pm, patients seen by a lottery system. °For dates, call or go to www.med.[facility].edu/[facility]/patients/Dental-[facility] °Services: °Cleanings, fillings and simple extractions. °Payment Options: °DENTAL WORK IS FREE OF CHARGE. Bring proof of income or support. °Best way to get seen: °Arrive at 5:15 pm - this is a lottery, NOT first come/first serve, so arriving earlier will not increase your chances of being seen. °  °  °[facility] °[phone] °Select option 1 for emergencies °  °Location: °[facility], [address], [place] °Clinic Hours: °No walk-ins accepted - call the day before to schedule an appointment. °Check in times are 9:30 am and 1:30 pm. °Services: °Simple extractions, temporary fillings, pulpectomy/pulp debridement, uncomplicated abscess drainage. °Payment Options: °PAYMENT IS DUE AT THE TIME OF SERVICE.  Fee is usually $100-200, additional surgical procedures (e.g. abscess drainage) may be extra. °Cash, checks, Visa/MasterCard accepted.  Can file Medicaid if patient is covered for dental - patient should call case worker to check. °No discount for [facility] patients. °Best way to get seen: °MUST call the day before and get onto the schedule. Can usually be seen the next 1-2 days. No walk-ins accepted. °  °  °[facility] °[phone] °   °Location: °[facility], [address], [place] °Clinic Hours: °M, W, Th, F 8am or 1:30pm, Tues 9a or 1:30 - first come/first served. °Services: °Simple extractions, temporary fillings, uncomplicated abscess drainage.  You do not need to be an [place] resident. °Payment Options: °PAYMENT IS DUE AT THE TIME OF SERVICE. °Dental insurance, otherwise sliding scale - bring proof of income or support. °Depending on income and treatment needed, cost is usually $50-200. °Best way to get seen: °Arrive early as it is first come/first served. °  °  °[facility] °[phone] °  °Location: °[address] °Clinic Hours: °Mon-Thu 8a-5p °Services: °Most basic dental services including extractions and fillings. °Payment Options: °PAYMENT IS DUE AT THE TIME OF SERVICE. °Sliding scale, up to 50% off - bring proof if income or support. °Medicaid with dental option accepted. °Best way to get seen: °Call to schedule an appointment, can usually be seen within 2 weeks OR they will try to see walk-ins - show up at 8a or 2p (you may have to wait). °  °  °[facility] °[phone] °[place] RESIDENTS ONLY °  °Location: °[facility], [address], [place], [place] [address] °Clinic Hours: By appointment only. °Monday - Thursday 8am-5pm, Friday 8am-12pm °Services: Cleanings, fillings, extractions. °Payment Options: °PAYMENT IS DUE AT THE TIME OF SERVICE. °Cash, Visa or MasterCard. Sliding scale - $30 minimum per service. °Best way to get seen: °Come in to office, complete packet and make an appointment - need proof of income °or support monies for each household member and proof of [place] residence. °Usually takes about a month to get in. °  °  °[facility] °[phone] °  °Location: °[address]   [place] °Clinic Hours: Walk-in Urgent Care Dental Services are offered Monday-Friday  mornings only. °The numbers of emergencies accepted daily is limited to the number of °providers available. °Maximum 15 - Mondays, Wednesdays & Thursdays °Maximum 10 - Tuesdays & Fridays °Services: °You do not need to be a [place] resident to be seen for a dental emergency. °Emergencies are defined as pain, swelling, abnormal bleeding, or dental trauma. Walkins will receive x-rays if needed. °NOTE: Dental cleaning is not an emergency. °Payment Options: °PAYMENT IS DUE AT THE TIME OF SERVICE. °Minimum co-pay is $40.00 for uninsured patients. °Minimum co-pay is $3.00 for Medicaid with dental coverage. °Dental Insurance is accepted and must be presented at time of visit. °Medicare does not cover dental. °Forms of payment: Cash, credit card, checks. °Best way to get seen: °If not previously registered with the clinic, walk-in dental registration begins at 7:15 am and is on a first come/first serve basis. °If previously registered with the clinic, call to make an appointment. °  °  °[facility] °[phone] °[place] RESIDENTS ONLY °  °Location: °[address], [place], [place] °Clinic Hours: °Mon-Thu 10a-2p °Services: Extractions only! °Payment Options: °FREE (donations accepted) - bring proof of income or support °Best way to get seen: °Call and schedule an appointment OR come at 8am on the 1st Monday of every month (except for holidays) when it is first come/first served. °  °  °[facility] °[phone] °  °Location: °[address], [place] °Clinic Hours: °Friday mornings °Services, Payment Options, Best way to get seen: °Call for info °

## 2023-05-10 NOTE — ED Provider Notes (Signed)
[facility] Provider Note    Event Date/Time   First MD Initiated Contact with Patient 05/10/23 1111     (approximate)   History   Dental Pain   HPI  [name] is a 32 y.o. female with history of MR, and other nonrelated chronic illnesses presents emergency department complaining of right sided dental pain for about 2 weeks.  Feels like her jaw was swollen.  Unable to be seen by her dentist.  No fever or chills.      Physical Exam   Triage Vital Signs: ED Triage Vitals  Encounter Vitals Group     BP 05/10/23 1044 (!) 145/106     Systolic BP Percentile --      Diastolic BP Percentile --      Pulse Rate 05/10/23 1044 85     Resp 05/10/23 1044 15     Temp 05/10/23 1044 98.5 F (36.9 C)     Temp Source 05/10/23 1044 Oral     SpO2 05/10/23 1044 97 %     Weight 05/10/23 1033 200 lb (90.7 kg)     Height 05/10/23 1033 5\' 4"  (1.626 m)     Head Circumference --      Peak Flow --      Pain Score 05/10/23 1033 10     Pain Loc --      Pain Education --      Exclude from Growth Chart --     Most recent vital signs: Vitals:   05/10/23 1044  BP: (!) 145/106  Pulse: 85  Resp: 15  Temp: 98.5 F (36.9 C)  SpO2: 97%     General: Awake, no distress.   CV:  Good peripheral perfusion. regular rate and  rhythm Resp:  Normal effort. Lungs cta Abd:  No distention.   Other:  Cavity noted in the right lower molar, mandible tender to palpation, neck is supple, no lymphadenopathy   ED Results / Procedures / Treatments   Labs (all labs ordered are listed, but only abnormal results are displayed) Labs Reviewed - No data to display   EKG     RADIOLOGY     PROCEDURES:   Procedures   MEDICATIONS ORDERED IN ED: Medications - No data to display   IMPRESSION / MDM / ASSESSMENT AND PLAN / ED COURSE  I reviewed the triage vital signs and the nursing notes.                              Differential diagnosis includes, but is not  limited to, dental pain, dental abscess, dental caries  Patient's presentation is most consistent with acute, uncomplicated illness.   I did explain findings to patient.  She is placed on amoxicillin ibuprofen.  She is to follow-up with one of the dental clinics provided on her discharge papers.  Return emergency department worsening.  She is in agreement treatment plan.  Discharged stable condition.  Was given a work note for today.      FINAL CLINICAL IMPRESSION(S) / ED DIAGNOSES   Final diagnoses:  Pain, dental  Dental abscess     Rx / DC Orders   ED Discharge Orders          Ordered    amoxicillin (AMOXIL) 875 MG tablet  2 times daily        05/10/23 1138    ibuprofen (ADVIL) 600 MG tablet  Every 6 hours  PRN        05/10/23 1138             Note:  This document was prepared using Dragon voice recognition software and may include unintentional dictation errors.    Faythe Ghee, PA-C 05/10/23 1208    Dionne Bucy, MD 05/10/23 [phone]

## 2023-06-23 ENCOUNTER — Emergency Department
Admission: EM | Admit: 2023-06-23 | Discharge: 2023-06-23 | Disposition: A | Discharge status: Home / Self Care | Payer: 59 | Attending: Emergency Medicine | Admitting: Emergency Medicine

## 2023-06-23 ENCOUNTER — Other Ambulatory Visit: Payer: Self-pay

## 2023-06-23 DIAGNOSIS — G43909 Migraine, unspecified, not intractable, without status migrainosus: Secondary | ICD-10-CM | POA: Diagnosis present

## 2023-06-23 DIAGNOSIS — R110 Nausea: Secondary | ICD-10-CM | POA: Insufficient documentation

## 2023-06-23 DIAGNOSIS — Z5321 Procedure and treatment not carried out due to patient leaving prior to being seen by health care provider: Secondary | ICD-10-CM | POA: Insufficient documentation

## 2023-06-23 NOTE — ED Triage Notes (Signed)
Pt comes  with c/o migraine on and off for about month. Pt states no relief with any meds. Pt states some nausea.

## 2023-07-13 ENCOUNTER — Emergency Department
Admission: EM | Admit: 2023-07-13 | Discharge: 2023-07-13 | Disposition: A | Discharge status: Home / Self Care | Payer: 59 | Attending: Emergency Medicine | Admitting: Emergency Medicine

## 2023-07-13 DIAGNOSIS — W57XXXA Bitten or stung by nonvenomous insect and other nonvenomous arthropods, initial encounter: Secondary | ICD-10-CM | POA: Diagnosis not present

## 2023-07-13 DIAGNOSIS — L02415 Cutaneous abscess of right lower limb: Secondary | ICD-10-CM | POA: Diagnosis not present

## 2023-07-13 DIAGNOSIS — S70361A Insect bite (nonvenomous), right thigh, initial encounter: Secondary | ICD-10-CM | POA: Diagnosis present

## 2023-07-13 DIAGNOSIS — R519 Headache, unspecified: Secondary | ICD-10-CM | POA: Diagnosis not present

## 2023-07-13 DIAGNOSIS — R42 Dizziness and giddiness: Secondary | ICD-10-CM | POA: Diagnosis not present

## 2023-07-13 MED ORDER — SULFAMETHOXAZOLE-TRIMETHOPRIM 800-160 MG PO TABS: 1.0000 | ORAL_TABLET | Freq: Two times a day (BID) | ORAL | 0 refills | Status: AC

## 2023-07-13 MED ORDER — IBUPROFEN 600 MG PO TABS
600.0000 mg | ORAL_TABLET | Freq: Once | ORAL | Status: AC
  Administered 2023-07-13: 600 mg via ORAL
  Filled 2023-07-13: qty 1

## 2023-07-13 MED ORDER — SULFAMETHOXAZOLE-TRIMETHOPRIM 800-160 MG PO TABS
1.0000 | ORAL_TABLET | Freq: Once | ORAL | Status: AC
  Administered 2023-07-13: 1 via ORAL
  Filled 2023-07-13: qty 1

## 2023-07-13 MED ORDER — IBUPROFEN 600 MG PO TABS: ORAL_TABLET | ORAL | 0 refills | Status: DC

## 2023-07-13 NOTE — ED Provider Notes (Signed)
[facility] Provider Note    Event Date/Time   First MD Initiated Contact with Patient 07/13/23 0226     (approximate)   History   Wound Check (Pt presents to ED from home via POV. Pt states she was bit by a spider on the right upper thigh yesterday. Pt also c/o headache and dizziness. Pt c/o pain around the area, swelling, and bleeding. Pt did not see spider or insect that bit her. Area is covered by bandaids that pt refuses to have moved at this time. Pt denies fevers at home. Pt ambulates without difficulty. Pt is a and ox4. Pt resp are even and unlabored. Pt skin is dry and warm and appropriate for ethnicity. )   HPI [name] is a 32 y.o. female who presents with concerns of a wound on her leg that she thinks might be from a spider bite.  She said that she noticed the wound at the top of her right inner thigh in the morning yesterday.  It was very painful all throughout the day while she was at work and it is draining pus.  She saw a spider on her leg around the time she noticed the wound so she believes it might be a spider bite.  She has not had any fever, nausea, nor vomiting but she has felt bad all over during the day while working.     Physical Exam   Triage Vital Signs: ED Triage Vitals  Encounter Vitals Group     BP 07/13/23 0140 (!) 145/104     Systolic BP Percentile --      Diastolic BP Percentile --      Pulse Rate 07/13/23 0140 87     Resp 07/13/23 0140 18     Temp 07/13/23 0140 98.7 F (37.1 C)     Temp Source 07/13/23 0140 Oral     SpO2 07/13/23 0140 100 %     Weight 07/13/23 0141 93 kg (205 lb)     Height 07/13/23 0141 1.651 m (5\' 5" )     Head Circumference --      Peak Flow --      Pain Score 07/13/23 0140 10     Pain Loc --      Pain Education --      Exclude from Growth Chart --     Most recent vital signs: Vitals:   07/13/23 0140  BP: (!) 145/104  Pulse: 87  Resp: 18  Temp: 98.7 F (37.1 C)  SpO2: 100%     General: Awake, no obvious distress. CV:  Good peripheral perfusion.  Resp:  Normal effort. Speaking easily and comfortably, no accessory muscle usage nor intercostal retractions.   Abd:  No distention.  Other:  Patient has a raised and indurated lesion at the top of her right inner thigh consistent with cutaneous abscess.  It is about 2 cm in diameter (the indurated area).  It is open in the middle and draining a moderate to copious amount of purulent material at baseline, more with mild pressure around the area.  No surrounding cellulitis.   ED Results / Procedures / Treatments   Labs (all labs ordered are listed, but only abnormal results are displayed) Labs Reviewed - No data to display    PROCEDURES:  Critical Care performed: No  Procedures    IMPRESSION / MDM / ASSESSMENT AND PLAN / ED COURSE  I reviewed the triage vital signs and the nursing notes.  Differential diagnosis includes, but is not limited to, abscess, insect or spider bite, cellulitis.  Patient's presentation is most consistent with acute, uncomplicated illness.   Interventions/Medications given:  Medications  sulfamethoxazole-trimethoprim (BACTRIM DS) 800-160 MG per tablet 1 tablet (has no administration in time range)  ibuprofen (ADVIL) tablet 600 mg (has no administration in time range)    (Note:  hospital course my include additional interventions and/or labs/studies not listed above.)   I explained to the patient that she has a cutaneous abscess, not an insect nor spider bite.  Explained that it needs to drain the pus, and since it is draining on its own we could do that, but it will heal better and faster if we perform incision and drainage.  We talked about the risks and benefits and she rather adamantly declines having the incision and drainage performed at this time.  She understands that it may not heal as faster as well.  We agreed that I would try antibiotics  and that she would sit in warm bath and try to very gently express more of the pus.  She knows to return immediately either here or to an urgent care if it is getting worse or she develops new symptoms that concern her.         FINAL CLINICAL IMPRESSION(S) / ED DIAGNOSES   Final diagnoses:  Cutaneous abscess of right lower extremity     Rx / DC Orders   ED Discharge Orders          Ordered    sulfamethoxazole-trimethoprim (BACTRIM DS) 800-160 MG tablet  2 times daily        07/13/23 0344    ibuprofen (ADVIL) 600 MG tablet        07/13/23 0345             Note:  This document was prepared using Dragon voice recognition software and may include unintentional dictation errors.   Loleta Rose, MD 07/13/23 [phone]

## 2023-07-13 NOTE — Discharge Instructions (Addendum)
As we discussed, the infection in your leg abscess needs to come out.  Since it is draining now and you do not want an incision and drainage (I&D) to open the wound further, we encourage you to sit in warm water and to try to gently "milk" the wound by gently pressing around the area and trying to get more pus to come out.  Please take the full course of antibiotics as prescribed.  Return to the emergency department if it is getting worse or if you develop new symptoms that concern you.

## 2023-07-13 NOTE — ED Triage Notes (Signed)
Pt presents to ED from home via POV. Pt states she was bit by a spider on the right upper thigh yesterday. Pt also c/o headache and dizziness. Pt c/o pain around the area, swelling, and bleeding. Pt did not see spider or insect that bit her. Area is covered by bandaids that pt refuses to have moved at this time. Pt denies fevers at home. Pt ambulates without difficulty. Pt is a and ox4. Pt resp are even and unlabored. Pt skin is dry and warm and appropriate for ethnicity.

## 2023-10-15 ENCOUNTER — Emergency Department
Admission: EM | Admit: 2023-10-15 | Discharge: 2023-10-15 | Discharge status: Against Medical Advice | Payer: 59 | Attending: Emergency Medicine | Admitting: Emergency Medicine

## 2023-10-15 ENCOUNTER — Other Ambulatory Visit: Payer: Self-pay

## 2023-10-15 DIAGNOSIS — L02214 Cutaneous abscess of groin: Secondary | ICD-10-CM | POA: Diagnosis not present

## 2023-10-15 DIAGNOSIS — R519 Headache, unspecified: Secondary | ICD-10-CM | POA: Insufficient documentation

## 2023-10-15 DIAGNOSIS — Z5321 Procedure and treatment not carried out due to patient leaving prior to being seen by health care provider: Secondary | ICD-10-CM | POA: Insufficient documentation

## 2023-10-15 DIAGNOSIS — R6883 Chills (without fever): Secondary | ICD-10-CM | POA: Insufficient documentation

## 2023-10-15 LAB — URINALYSIS, ROUTINE W REFLEX MICROSCOPIC
Bilirubin Urine: NEGATIVE
Glucose, UA: NEGATIVE mg/dL
Hgb urine dipstick: NEGATIVE
Ketones, ur: NEGATIVE mg/dL
Leukocytes,Ua: NEGATIVE
Nitrite: NEGATIVE
Protein, ur: NEGATIVE mg/dL
Specific Gravity, Urine: 1.025 (ref 1.005–1.030)
pH: 5 (ref 5.0–8.0)

## 2023-10-15 LAB — COMPREHENSIVE METABOLIC PANEL
ALT: 14 U/L (ref 0–44)
AST: 16 U/L (ref 15–41)
Albumin: 3.9 g/dL (ref 3.5–5.0)
Alkaline Phosphatase: 52 U/L (ref 38–126)
Anion gap: 9 (ref 5–15)
BUN: 18 mg/dL (ref 6–20)
CO2: 24 mmol/L (ref 22–32)
Calcium: 9.2 mg/dL (ref 8.9–10.3)
Chloride: 108 mmol/L (ref 98–111)
Creatinine, Ser: 0.75 mg/dL (ref 0.44–1.00)
GFR, Estimated: >60 mL/min (ref >60)
Glucose, Bld: 97 mg/dL (ref 70–99)
Potassium: 4.1 mmol/L (ref 3.5–5.1)
Sodium: 141 mmol/L (ref 135–145)
Total Bilirubin: 0.8 mg/dL (ref 0.0–1.2)
Total Protein: 7.4 g/dL (ref 6.5–8.1)

## 2023-10-15 LAB — RESP PANEL BY RT-PCR (RSV, FLU A&B, COVID)  RVPGX2
Influenza A by PCR: NEGATIVE
Influenza B by PCR: NEGATIVE
Resp Syncytial Virus by PCR: NEGATIVE
SARS Coronavirus 2 by RT PCR: NEGATIVE

## 2023-10-15 LAB — CBC
HCT: 37.6 % (ref 36.0–46.0)
Hemoglobin: 12.2 g/dL (ref 12.0–15.0)
MCH: 26.5 pg (ref 26.0–34.0)
MCHC: 32.4 g/dL (ref 30.0–36.0)
MCV: 81.6 fL (ref 80.0–100.0)
Platelets: 331 10*3/uL (ref 150–400)
RBC: 4.61 MIL/uL (ref 3.87–5.11)
RDW: 13.3 % (ref 11.5–15.5)
WBC: 9.6 10*3/uL (ref 4.0–10.5)
nRBC: 0 % (ref 0.0–0.2)

## 2023-10-15 LAB — LIPASE, BLOOD: Lipase: 35 U/L (ref 11–51)

## 2023-10-15 LAB — POC URINE PREG, ED: Preg Test, Ur: NEGATIVE

## 2023-10-15 NOTE — ED Notes (Signed)
Called x3 with no answer from lobby.

## 2023-10-15 NOTE — ED Triage Notes (Signed)
Pt here with chills, headache, and an abscess. Pt states this started last night. Pt has an abscess in her lower left pelvic area. Pt also having abd pain all over.

## 2023-11-26 ENCOUNTER — Emergency Department
Admission: EM | Admit: 2023-11-26 | Discharge: 2023-11-26 | Disposition: A | Discharge status: Home / Self Care | Attending: Emergency Medicine | Admitting: Emergency Medicine

## 2023-11-26 ENCOUNTER — Other Ambulatory Visit: Payer: Self-pay

## 2023-11-26 ENCOUNTER — Encounter: Payer: Self-pay | Admitting: Emergency Medicine

## 2023-11-26 DIAGNOSIS — R519 Headache, unspecified: Secondary | ICD-10-CM | POA: Insufficient documentation

## 2023-11-26 LAB — CBC
HCT: 35.2 % — ABNORMAL LOW (ref 36.0–46.0)
Hemoglobin: 11.5 g/dL — ABNORMAL LOW (ref 12.0–15.0)
MCH: 26.9 pg (ref 26.0–34.0)
MCHC: 32.7 g/dL (ref 30.0–36.0)
MCV: 82.2 fL (ref 80.0–100.0)
Platelets: 282 10*3/uL (ref 150–400)
RBC: 4.28 MIL/uL (ref 3.87–5.11)
RDW: 13.4 % (ref 11.5–15.5)
WBC: 6.6 10*3/uL (ref 4.0–10.5)
nRBC: 0 % (ref 0.0–0.2)

## 2023-11-26 LAB — BASIC METABOLIC PANEL
Anion gap: 7 (ref 5–15)
BUN: 15 mg/dL (ref 6–20)
CO2: 24 mmol/L (ref 22–32)
Calcium: 9.1 mg/dL (ref 8.9–10.3)
Chloride: 107 mmol/L (ref 98–111)
Creatinine, Ser: 0.65 mg/dL (ref 0.44–1.00)
GFR, Estimated: >60 mL/min (ref >60)
Glucose, Bld: 98 mg/dL (ref 70–99)
Potassium: 4.4 mmol/L (ref 3.5–5.1)
Sodium: 138 mmol/L (ref 135–145)

## 2023-11-26 LAB — RESP PANEL BY RT-PCR (RSV, FLU A&B, COVID)  RVPGX2
Influenza A by PCR: NEGATIVE
Influenza B by PCR: NEGATIVE
Resp Syncytial Virus by PCR: NEGATIVE
SARS Coronavirus 2 by RT PCR: NEGATIVE

## 2023-11-26 MED ORDER — NAPROXEN 500 MG PO TABS: 500.0000 mg | ORAL_TABLET | Freq: Two times a day (BID) | ORAL | 2 refills | Status: AC

## 2023-11-26 MED ORDER — KETOROLAC TROMETHAMINE 30 MG/ML IJ SOLN
30.0000 mg | Freq: Once | INTRAMUSCULAR | Status: AC
  Administered 2023-11-26: 30 mg via INTRAVENOUS
  Filled 2023-11-26: qty 1

## 2023-11-26 NOTE — ED Notes (Signed)
 Attempted to call legal guardian at this time with no answer.

## 2023-11-26 NOTE — ED Triage Notes (Signed)
 Patient to ED via POV for headache and body aches x4 days. Denies fever or chest pain. States she is having light sensitivity and having to sit in the dark at home due to same.

## 2023-11-26 NOTE — ED Notes (Signed)
 See triage note  Presents with with headache and body aches  States this started 4 days ago   Unsure of fever  but is currently afebrile

## 2023-11-26 NOTE — ED Provider Notes (Signed)
 [facility] Provider Note    Event Date/Time   First MD Initiated Contact with Patient 11/26/23 [phone]     (approximate)   History   Headache   HPI  [name] is a 33 y.o. female who presents with complaints of headache, body aches which started 2 days ago.  No neurodeficits.  No neck pain, no dysuria.  No rash.     Physical Exam   Triage Vital Signs: ED Triage Vitals [11/26/23 0854]  Encounter Vitals Group     BP (!) 137/107     Systolic BP Percentile      Diastolic BP Percentile      Pulse Rate 76     Resp 18     Temp 98.2 F (36.8 C)     Temp Source Oral     SpO2 99 %     Weight 89.8 kg (198 lb)     Height 1.676 m (5\' 6" )     Head Circumference      Peak Flow      Pain Score 10     Pain Loc      Pain Education      Exclude from Growth Chart     Most recent vital signs: Vitals:   11/26/23 0854 11/26/23 0915  BP: (!) 137/107 126/82  Pulse: 76 99  Resp: 18 18  Temp: 98.2 F (36.8 C) 98.1 F (36.7 C)  SpO2: 99% 100%     General: Awake, no distress.  Reassuring exam CV:  Good peripheral perfusion.  Resp:  Normal effort.  Abd:  No distention.  Other:  Normal neurologic exam, PERRLA   ED Results / Procedures / Treatments   Labs (all labs ordered are listed, but only abnormal results are displayed) Labs Reviewed  CBC - Abnormal; Notable for the following components:      Result Value   Hemoglobin 11.5 (*)    HCT 35.2 (*)    All other components within normal limits  RESP PANEL BY RT-PCR (RSV, FLU A&B, COVID)  RVPGX2  BASIC METABOLIC PANEL     EKG     RADIOLOGY     PROCEDURES:  Critical Care performed:   Procedures   MEDICATIONS ORDERED IN ED: Medications  ketorolac (TORADOL) 30 MG/ML injection 30 mg (30 mg Intravenous Given 11/26/23 0954)     IMPRESSION / MDM / ASSESSMENT AND PLAN / ED COURSE  I reviewed the triage vital signs and the nursing notes. Patient's presentation is most consistent  with acute illness / injury with system symptoms.  Patient presents with headache, body aches as described above, differential includes viral illness such as influenza versus COVID which are both prevalent in community this time or possibly migraine headache.  Overall quite well-appearing and in no distress.  Having normal periods, no concern for pregnancy.  Will give a dose of IV Toradol while we await labs, COVID testing  Lab work, COVID, flu negative  Patient reports headache is resolved and she is ready for discharge.  Given reassuring workup, normal exam, no indication for imaging at this time, outpatient follow-up recommended, return precautions discussed, she agrees with this plan.      FINAL CLINICAL IMPRESSION(S) / ED DIAGNOSES   Final diagnoses:  Acute nonintractable headache, unspecified headache type     Rx / DC Orders   ED Discharge Orders          Ordered    naproxen (NAPROSYN) 500 MG tablet  2  times daily with meals        11/26/23 1013             Note:  This document was prepared using Dragon voice recognition software and may include unintentional dictation errors.   Jene Every, MD 11/26/23 1013

## 2023-12-31 ENCOUNTER — Emergency Department
Admission: EM | Admit: 2023-12-31 | Discharge: 2023-12-31 | Disposition: A | Discharge status: Home / Self Care | Attending: Emergency Medicine | Admitting: Emergency Medicine

## 2023-12-31 DIAGNOSIS — L02415 Cutaneous abscess of right lower limb: Secondary | ICD-10-CM

## 2023-12-31 DIAGNOSIS — R220 Localized swelling, mass and lump, head: Secondary | ICD-10-CM | POA: Diagnosis present

## 2023-12-31 LAB — POC URINE PREG, ED: Preg Test, Ur: NEGATIVE

## 2023-12-31 MED ORDER — DOXYCYCLINE HYCLATE 100 MG PO TABS: 100.0000 mg | ORAL_TABLET | Freq: Two times a day (BID) | ORAL | 0 refills | Status: DC

## 2023-12-31 NOTE — ED Provider Notes (Signed)
 [facility] Provider Note   Event Date/Time   First MD Initiated Contact with Patient 12/31/23 1322     (approximate) History  Wound Check and Headache  HPI [name] is a 33 y.o. female who presents with multiple complaints including a swollen area with active drainage to the right anterior thigh as well as left facial swelling that has been present over the last 4 days.  Patient states that both began at the same time.  Patient states that this leg wound began as a small raised area that was tender to palpation but now is open, draining, and has significant tenderness to palpation around this wound.  Patient also requesting a pregnancy test ROS: Patient currently denies any fever, dental pain, vision changes, tinnitus, difficulty speaking, facial droop, sore throat, chest pain, shortness of breath, abdominal pain, nausea/vomiting/diarrhea, dysuria, vaginal discharge, or weakness/numbness/paresthesias in any extremity   Physical Exam  Triage Vital Signs: ED Triage Vitals  Encounter Vitals Group     BP 12/31/23 1259 (!) 149/101     Systolic BP Percentile --      Diastolic BP Percentile --      Pulse Rate 12/31/23 1259 82     Resp 12/31/23 1259 18     Temp 12/31/23 1259 98 F (36.7 C)     Temp Source 12/31/23 1259 Oral     SpO2 12/31/23 1259 97 %     Weight 12/31/23 1303 198 lb (89.8 kg)     Height 12/31/23 1303 5\' 6"  (1.676 m)     Head Circumference --      Peak Flow --      Pain Score 12/31/23 1303 10     Pain Loc --      Pain Education --      Exclude from Growth Chart --    Most recent vital signs: Vitals:   12/31/23 1259  BP: (!) 149/101  Pulse: 82  Resp: 18  Temp: 98 F (36.7 C)  SpO2: 97%   General: Awake, oriented x4. CV:  Good peripheral perfusion.  Resp:  Normal effort.  Abd:  No distention.  Other:  Mild left facial swelling.  There is a 1 cm open ulceration with surrounding erythema and induration to the proximal right medial  thigh resting comfortably in no acute distress ED Results / Procedures / Treatments  PROCEDURES: Critical Care performed: No Procedures MEDICATIONS ORDERED IN ED: Medications - No data to display IMPRESSION / MDM / ASSESSMENT AND PLAN / ED COURSE  I reviewed the triage vital signs and the nursing notes.                             The patient is on the cardiac monitor to evaluate for evidence of arrhythmia and/or significant heart rate changes. Patient's presentation is most consistent with acute presentation with potential threat to life or bodily function. Presentation most consistent with simple Abscess. Given History, Exam, and Workup I have low suspicion for Cellulitis, Necrotizing Fasciitis, Pyomyositis, Sporotrichosis, Osteomyelitis or other emergent problem as a cause for this presentation. Interventions: none necessary as wound is already open and draining Rx: Doxycycline 100 mg BID x 5 days Pregnancy test negative. No obvious reasoning for left facial swelling.  Patient has good dentition anteriorly and no obvious external signs of cellulitis.  If this is something infectious, will likely be treated with oral doxycycline Disposition: Patient is stable for discharge, advised  to follow up with primary care physician in 48 hours.   FINAL CLINICAL IMPRESSION(S) / ED DIAGNOSES   Final diagnoses:  Abscess of right thigh  Left facial swelling   Rx / DC Orders   ED Discharge Orders          Ordered    doxycycline (VIBRA-TABS) 100 MG tablet  2 times daily        12/31/23 1416           Note:  This document was prepared using Dragon voice recognition software and may include unintentional dictation errors.   Damyon Mullane K, MD 12/31/23 1420

## 2023-12-31 NOTE — ED Triage Notes (Addendum)
 Pt c/o headache and L side facial swelling x2 days and increasing wound on R groin x4 days.  Pain score 10/10.  Dime sized open wound noted on R groin.  Pt reports pain around wound and into R thigh.    Also, Pt is requesting a pregnancy test.   Pt reports Legal Guardian is aware she is here and directed her to come in.

## 2023-12-31 NOTE — ED Notes (Addendum)
 Spoke with Legal Guardian Coleen Dauer at this time. Informed of d/c. Per Legal Guardian  pt is able to leave by herself.

## 2023-12-31 NOTE — ED Notes (Signed)
 Legal Guardian called. No answer. Messaged left per this RN.

## 2024-01-29 ENCOUNTER — Emergency Department
Admission: EM | Admit: 2024-01-29 | Discharge: 2024-01-29 | Discharge status: Against Medical Advice | Attending: Emergency Medicine | Admitting: Emergency Medicine

## 2024-01-29 ENCOUNTER — Other Ambulatory Visit: Payer: Self-pay

## 2024-01-29 DIAGNOSIS — Z5321 Procedure and treatment not carried out due to patient leaving prior to being seen by health care provider: Secondary | ICD-10-CM | POA: Insufficient documentation

## 2024-01-29 DIAGNOSIS — N939 Abnormal uterine and vaginal bleeding, unspecified: Secondary | ICD-10-CM | POA: Diagnosis not present

## 2024-01-29 DIAGNOSIS — R1030 Lower abdominal pain, unspecified: Secondary | ICD-10-CM | POA: Diagnosis present

## 2024-01-29 LAB — CBC WITH DIFFERENTIAL/PLATELET
Abs Immature Granulocytes: 0.04 10*3/uL (ref 0.00–0.07)
Basophils Absolute: 0.1 10*3/uL (ref 0.0–0.1)
Basophils Relative: 1 %
Eosinophils Absolute: 0.4 10*3/uL (ref 0.0–0.5)
Eosinophils Relative: 3 %
HCT: 36.7 % (ref 36.0–46.0)
Hemoglobin: 11.8 g/dL — ABNORMAL LOW (ref 12.0–15.0)
Immature Granulocytes: 0 %
Lymphocytes Relative: 32 %
Lymphs Abs: 3.9 10*3/uL (ref 0.7–4.0)
MCH: 26.4 pg (ref 26.0–34.0)
MCHC: 32.2 g/dL (ref 30.0–36.0)
MCV: 82.1 fL (ref 80.0–100.0)
Monocytes Absolute: 0.8 10*3/uL (ref 0.1–1.0)
Monocytes Relative: 6 %
Neutro Abs: 7.3 10*3/uL (ref 1.7–7.7)
Neutrophils Relative %: 58 %
Platelets: 325 10*3/uL (ref 150–400)
RBC: 4.47 MIL/uL (ref 3.87–5.11)
RDW: 13.2 % (ref 11.5–15.5)
WBC: 12.5 10*3/uL — ABNORMAL HIGH (ref 4.0–10.5)
nRBC: 0 % (ref 0.0–0.2)

## 2024-01-29 LAB — BASIC METABOLIC PANEL WITH GFR
Anion gap: 10 (ref 5–15)
BUN: 11 mg/dL (ref 6–20)
CO2: 19 mmol/L — ABNORMAL LOW (ref 22–32)
Calcium: 8.6 mg/dL — ABNORMAL LOW (ref 8.9–10.3)
Chloride: 110 mmol/L (ref 98–111)
Creatinine, Ser: 0.78 mg/dL (ref 0.44–1.00)
GFR, Estimated: >60 mL/min (ref >60)
Glucose, Bld: 103 mg/dL — ABNORMAL HIGH (ref 70–99)
Potassium: 3.9 mmol/L (ref 3.5–5.1)
Sodium: 139 mmol/L (ref 135–145)

## 2024-01-29 LAB — URINALYSIS, ROUTINE W REFLEX MICROSCOPIC
Bacteria, UA: NONE SEEN
Bilirubin Urine: NEGATIVE
Glucose, UA: NEGATIVE mg/dL
Ketones, ur: NEGATIVE mg/dL
Leukocytes,Ua: NEGATIVE
Nitrite: NEGATIVE
Protein, ur: NEGATIVE mg/dL
Specific Gravity, Urine: 1.027 (ref 1.005–1.030)
pH: 5 (ref 5.0–8.0)

## 2024-01-29 LAB — POC URINE PREG, ED: Preg Test, Ur: NEGATIVE

## 2024-01-29 NOTE — ED Triage Notes (Signed)
 Pt reports lower abd pain that began with vaginal bleeding a few days ago. Pt reports she got her last depo shot in September and is unsure if she is pregnant or not.

## 2024-01-30 ENCOUNTER — Emergency Department

## 2024-01-30 ENCOUNTER — Emergency Department
Admission: EM | Admit: 2024-01-30 | Discharge: 2024-01-30 | Disposition: A | Discharge status: Home / Self Care | Attending: Emergency Medicine | Admitting: Emergency Medicine

## 2024-01-30 ENCOUNTER — Other Ambulatory Visit: Payer: Self-pay

## 2024-01-30 DIAGNOSIS — R1032 Left lower quadrant pain: Secondary | ICD-10-CM | POA: Insufficient documentation

## 2024-01-30 LAB — URINALYSIS, ROUTINE W REFLEX MICROSCOPIC
Bilirubin Urine: NEGATIVE
Glucose, UA: NEGATIVE mg/dL
Hgb urine dipstick: NEGATIVE
Ketones, ur: NEGATIVE mg/dL
Leukocytes,Ua: NEGATIVE
Nitrite: NEGATIVE
Protein, ur: NEGATIVE mg/dL
Specific Gravity, Urine: 1.021 (ref 1.005–1.030)
pH: 5 (ref 5.0–8.0)

## 2024-01-30 LAB — PREGNANCY, URINE: Preg Test, Ur: NEGATIVE

## 2024-01-30 NOTE — ED Triage Notes (Addendum)
 Pt comes with belly pain. Pt stats she has a cyst. Pt was seen here yesterday and was unable to stay to be seen. Pt states she is having some bleeding also.   Pt just had labs and urine done and it has resulted.

## 2024-01-30 NOTE — ED Notes (Signed)
POC Urine Preg= Negative  

## 2024-01-30 NOTE — ED Provider Notes (Signed)
 [facility] Provider Note    Event Date/Time   First MD Initiated Contact with Patient 01/30/24 1155     (approximate)   History   Abdominal Pain   HPI  [name] is a 33 y.o. female history of herpes labialis, anemia presents emergency department complaining of left lower quadrant pain.  Patient states she feels like it is a cyst on her ovary.  Came yesterday and had labs done.  States had to leave due to having to catch the bus.  Denies fever, chills, vaginal discharge.  However patient states she has had some vaginal bleeding on and off and unsure if it is coming from her urine.  Patient has had pain for 3 to 4 days      Physical Exam   Triage Vital Signs: ED Triage Vitals  Encounter Vitals Group     BP 01/30/24 1113 (!) 137/101     Systolic BP Percentile --      Diastolic BP Percentile --      Pulse Rate 01/30/24 1113 82     Resp 01/30/24 1113 18     Temp 01/30/24 1113 98.4 F (36.9 C)     Temp src --      SpO2 01/30/24 1113 96 %     Weight 01/30/24 1112 217 lb (98.4 kg)     Height 01/30/24 1112 5\' 6"  (1.676 m)     Head Circumference --      Peak Flow --      Pain Score 01/30/24 1111 10     Pain Loc --      Pain Education --      Exclude from Growth Chart --     Most recent vital signs: Vitals:   01/30/24 1113  BP: (!) 137/101  Pulse: 82  Resp: 18  Temp: 98.4 F (36.9 C)  SpO2: 96%     General: Awake, no distress.   CV:  Good peripheral perfusion. regular rate and  rhythm Resp:  Normal effort. Lungs cta Abd:  No distention.  Minimally tender left lower quadrant Other:      ED Results / Procedures / Treatments   Labs (all labs ordered are listed, but only abnormal results are displayed) Labs Reviewed  URINALYSIS, ROUTINE W REFLEX MICROSCOPIC - Abnormal; Notable for the following components:      Result Value   Color, Urine YELLOW (*)    APPearance CLEAR (*)    All other components within normal limits   PREGNANCY, URINE     EKG     RADIOLOGY Ultrasound pelvis with Doppler for torsion    PROCEDURES:   Procedures  Critical Care: None Chief Complaint  Patient presents with   Abdominal Pain      MEDICATIONS ORDERED IN ED: Medications - No data to display   IMPRESSION / MDM / ASSESSMENT AND PLAN / ED COURSE  I reviewed the triage vital signs and the nursing notes.                              Differential diagnosis includes, but is not limited to, abdominal pain, flank pain, ovarian cyst, vaginal bleeding, pregnancy, menstrual cycle, miscarriage  Patient's presentation is most consistent with acute illness / injury with system symptoms.   POC pregnancy and urinalysis reassuring  Did review the patient's labs from yesterday which are also reassuring  Ultrasound of the pelvis independently reviewed interpreted by  me by looking at images and reviewing radiologist report as being negative for any acute abnormality  I did explain the findings to the patient.  She states maybe she is just starting her period.  Encouraged her to take Tylenol  or ibuprofen  for pain.  Return emergency department if worsening.  Do not feel that there are any red flags to indicate CT at this time.  She was discharged stable condition.      FINAL CLINICAL IMPRESSION(S) / ED DIAGNOSES   Final diagnoses:  Left lower quadrant abdominal pain     Rx / DC Orders   ED Discharge Orders     None        Note:  This document was prepared using Dragon voice recognition software and may include unintentional dictation errors.    Delsie Figures, PA-C 01/30/24 1638    Kandee Orion, MD 01/30/24 [phone]

## 2024-05-07 ENCOUNTER — Other Ambulatory Visit

## 2024-05-11 ENCOUNTER — Ambulatory Visit (LOCAL_COMMUNITY_HEALTH_CENTER)

## 2024-05-11 DIAGNOSIS — Z111 Encounter for screening for respiratory tuberculosis: Secondary | ICD-10-CM

## 2024-05-11 NOTE — Progress Notes (Signed)
 Tuberculin skin test applied to left ventral forearm.  Explained not to use lotions or creams, place bandages or wraps on testing area.  Advised, if bleeding occurs, to lightly dab testing area and given 2x2 gauze in case of leakage or bleeding. Explained PPD to be read with 48-72 hrs and the importance in getting to appt on time.  Kandi KATHEE Glatter, RN

## 2024-05-14 ENCOUNTER — Ambulatory Visit (LOCAL_COMMUNITY_HEALTH_CENTER)

## 2024-05-14 DIAGNOSIS — Z111 Encounter for screening for respiratory tuberculosis: Secondary | ICD-10-CM

## 2024-05-14 LAB — TB SKIN TEST
Induration: 0 mm
TB Skin Test: NEGATIVE

## 2024-07-18 ENCOUNTER — Emergency Department

## 2024-07-18 ENCOUNTER — Emergency Department
Admission: EM | Admit: 2024-07-18 | Discharge: 2024-07-18 | Disposition: A | Discharge status: Home / Self Care | Attending: Emergency Medicine | Admitting: Emergency Medicine

## 2024-07-18 ENCOUNTER — Other Ambulatory Visit: Payer: Self-pay

## 2024-07-18 DIAGNOSIS — R112 Nausea with vomiting, unspecified: Secondary | ICD-10-CM

## 2024-07-18 DIAGNOSIS — R079 Chest pain, unspecified: Secondary | ICD-10-CM | POA: Diagnosis present

## 2024-07-18 DIAGNOSIS — J069 Acute upper respiratory infection, unspecified: Secondary | ICD-10-CM | POA: Diagnosis not present

## 2024-07-18 LAB — COMPREHENSIVE METABOLIC PANEL WITH GFR
ALT: 12 U/L (ref 0–44)
AST: 16 U/L (ref 15–41)
Albumin: 3.8 g/dL (ref 3.5–5.0)
Alkaline Phosphatase: 56 U/L (ref 38–126)
Anion gap: 12 (ref 5–15)
BUN: 14 mg/dL (ref 6–20)
CO2: 22 mmol/L (ref 22–32)
Calcium: 8.8 mg/dL — ABNORMAL LOW (ref 8.9–10.3)
Chloride: 103 mmol/L (ref 98–111)
Creatinine, Ser: 0.71 mg/dL (ref 0.44–1.00)
GFR, Estimated: >60 mL/min (ref >60)
Glucose, Bld: 86 mg/dL (ref 70–99)
Potassium: 3.8 mmol/L (ref 3.5–5.1)
Sodium: 137 mmol/L (ref 135–145)
Total Bilirubin: 1.2 mg/dL (ref 0.0–1.2)
Total Protein: 7.8 g/dL (ref 6.5–8.1)

## 2024-07-18 LAB — URINALYSIS, ROUTINE W REFLEX MICROSCOPIC
Bilirubin Urine: NEGATIVE
Glucose, UA: NEGATIVE mg/dL
Hgb urine dipstick: NEGATIVE
Ketones, ur: NEGATIVE mg/dL
Leukocytes,Ua: NEGATIVE
Nitrite: NEGATIVE
Protein, ur: NEGATIVE mg/dL
Specific Gravity, Urine: 1.016 (ref 1.005–1.030)
pH: 5 (ref 5.0–8.0)

## 2024-07-18 LAB — RESP PANEL BY RT-PCR (RSV, FLU A&B, COVID)  RVPGX2
Influenza A by PCR: NEGATIVE
Influenza B by PCR: NEGATIVE
Resp Syncytial Virus by PCR: NEGATIVE
SARS Coronavirus 2 by RT PCR: NEGATIVE

## 2024-07-18 LAB — CBC
HCT: 35.6 % — ABNORMAL LOW (ref 36.0–46.0)
Hemoglobin: 11.6 g/dL — ABNORMAL LOW (ref 12.0–15.0)
MCH: 25.6 pg — ABNORMAL LOW (ref 26.0–34.0)
MCHC: 32.6 g/dL (ref 30.0–36.0)
MCV: 78.4 fL — ABNORMAL LOW (ref 80.0–100.0)
Platelets: 388 K/uL (ref 150–400)
RBC: 4.54 MIL/uL (ref 3.87–5.11)
RDW: 13.5 % (ref 11.5–15.5)
WBC: 14.9 K/uL — ABNORMAL HIGH (ref 4.0–10.5)
nRBC: 0 % (ref 0.0–0.2)

## 2024-07-18 LAB — TROPONIN I (HIGH SENSITIVITY)
Troponin I (High Sensitivity): 2 ng/L (ref <18)
Troponin I (High Sensitivity): 3 ng/L (ref <18)

## 2024-07-18 LAB — POC URINE PREG, ED: Preg Test, Ur: NEGATIVE

## 2024-07-18 LAB — LIPASE, BLOOD: Lipase: 25 U/L (ref 11–51)

## 2024-07-18 MED ORDER — ONDANSETRON HCL 4 MG/2ML IJ SOLN
4.0000 mg | Freq: Once | INTRAMUSCULAR | Status: AC
  Administered 2024-07-18: 4 mg via INTRAVENOUS
  Filled 2024-07-18: qty 2

## 2024-07-18 MED ORDER — IOHEXOL 350 MG/ML SOLN
75.0000 mL | Freq: Once | INTRAVENOUS | Status: AC | PRN
  Administered 2024-07-18: 75 mL via INTRAVENOUS

## 2024-07-18 MED ORDER — KETOROLAC TROMETHAMINE 15 MG/ML IJ SOLN
15.0000 mg | Freq: Once | INTRAMUSCULAR | Status: AC
  Administered 2024-07-18: 15 mg via INTRAVENOUS
  Filled 2024-07-18: qty 1

## 2024-07-18 NOTE — ED Notes (Signed)
 IV team at bedside

## 2024-07-18 NOTE — ED Provider Notes (Signed)
 [facility] Provider Note    Event Date/Time   First MD Initiated Contact with Patient 07/18/24 1143     (approximate)   History   Abdominal Pain   HPI  [name] is a 33 y.o. female who presents today for evaluation of chest pain, abdominal pain, shortness of breath, nausea, body aches, and vomiting/diarrhea since yesterday.   Denies any blood in her stool.  Reports that her abdominal pain is crampy in nature and diffuse.  She reports that her belly pain improves temporarily after moving her bowels or throwing up.  She denies any sick contacts.  She has not had a fever, though she reports that she feels cold.  She has not taken anything for her symptoms.  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   Uterine contractions 12/24/2022   NST (non-stress test) nonreactive 12/19/2022   Abdominal pain during pregnancy in third trimester 11/08/2022   Previous cesarean delivery affecting pregnancy 09/12/2021   Postoperative state 09/12/2021   Sciatic pain 08/03/2021   Uterine size date discrepancy, second trimester 08/03/2021   Abdominal cramping affecting pregnancy 06/12/2021   Encounter for supervision of other normal pregnancy, third trimester 02/15/2021   Intermittent palpitations 06/25/2017   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          Physical Exam   Triage Vital Signs: ED Triage Vitals  Encounter Vitals Group     BP 07/18/24 1125 (!) 166/111     Girls Systolic BP Percentile --      Girls Diastolic BP Percentile --      Boys Systolic BP Percentile --      Boys Diastolic BP Percentile --      Pulse Rate 07/18/24 1125 86     Resp 07/18/24 1125 18     Temp 07/18/24 1125 98.6 F (37 C)     Temp src --      SpO2 07/18/24 1125 100 %     Weight 07/18/24 1124 226 lb (102.5 kg)     Height 07/18/24 1124 5' 5 (1.651 m)     Head Circumference --      Peak Flow --       Pain Score 07/18/24 1124 10     Pain Loc --      Pain Education --      Exclude from Growth Chart --     Most recent vital signs: Vitals:   07/18/24 1125 07/18/24 1523  BP: (!) 166/111 (!) 148/97  Pulse: 86 78  Resp: 18 16  Temp: 98.6 F (37 C) 98.1 F (36.7 C)  SpO2: 100% 100%    Physical Exam Vitals and nursing note reviewed.  Constitutional:      General: Awake and alert. No acute distress.    Appearance: Normal appearance. The patient is normal weight.  HENT:     Head: Normocephalic and atraumatic.     Mouth: Mucous membranes are moist. Uvula midline.  No tonsillar exudate.  No soft palate fluctuance.  No trismus.  No voice change.  No sublingual swelling.  No tender cervical lymphadenopathy.  No nuchal rigidity Eyes:     General: PERRL. Normal EOMs        Right eye: No discharge.        Left eye: No discharge.     Conjunctiva/sclera: Conjunctivae normal.  Cardiovascular:     Rate and Rhythm: Normal rate and regular rhythm.  Pulses: Normal pulses.  Pulmonary:     Effort: Pulmonary effort is normal. No respiratory distress.  No accessory muscle use    Breath sounds: Normal breath sounds.  Abdominal:     Abdomen is soft. There is no abdominal tenderness. No rebound or guarding. No distention. Musculoskeletal:        General: No swelling. Normal range of motion.     Cervical back: Normal range of motion and neck supple.  Skin:    General: Skin is warm and dry.     Capillary Refill: Capillary refill takes less than 2 seconds.     Findings: No rash.  Neurological:     Mental Status: The patient is awake and alert.      ED Results / Procedures / Treatments   Labs (all labs ordered are listed, but only abnormal results are displayed) Labs Reviewed  COMPREHENSIVE METABOLIC PANEL WITH GFR - Abnormal; Notable for the following components:      Result Value   Calcium  8.8 (*)    All other components within normal limits  CBC - Abnormal; Notable for the following  components:   WBC 14.9 (*)    Hemoglobin 11.6 (*)    HCT 35.6 (*)    MCV 78.4 (*)    MCH 25.6 (*)    All other components within normal limits  URINALYSIS, ROUTINE W REFLEX MICROSCOPIC - Abnormal; Notable for the following components:   Color, Urine YELLOW (*)    APPearance HAZY (*)    All other components within normal limits  RESP PANEL BY RT-PCR (RSV, FLU A&B, COVID)  RVPGX2  LIPASE, BLOOD  POC URINE PREG, ED  TROPONIN I (HIGH SENSITIVITY)  TROPONIN I (HIGH SENSITIVITY)     EKG     RADIOLOGY I independently reviewed and interpreted imaging and agree with radiologists findings.     PROCEDURES:  Critical Care performed:   Procedures   MEDICATIONS ORDERED IN ED: Medications  ketorolac  (TORADOL ) 15 MG/ML injection 15 mg (15 mg Intravenous Given 07/18/24 1349)  ondansetron  (ZOFRAN ) injection 4 mg (4 mg Intravenous Given 07/18/24 1349)  iohexol (OMNIPAQUE) 350 MG/ML injection 75 mL (75 mLs Intravenous Contrast Given 07/18/24 1358)     IMPRESSION / MDM / ASSESSMENT AND PLAN / ED COURSE  I reviewed the triage vital signs and the nursing notes.   Differential diagnosis includes, but is not limited to, COVID, flu, pneumonia, myocarditis, pericarditis, gastroenteritis, urinary tract infection.  Patient is awake and alert, hemodynamically stable and afebrile.  She is not toxic in appearance.  Labs obtained in triage reveal a leukocytosis to 15.  Urinalysis does not reveal evidence of infection.  hCG is negative.  EKG obtained reveals no acute ischemic signs or changes.  Her abdomen is soft and nontender throughout.  Troponin x 2 was negative, afebrile, unlikely myocarditis.  Chest x-ray is negative for any acute cardiopulmonary abnormality.  Given that she recently started OCPs and is complaining of chest pain and shortness of breath, CT angiogram obtained, and this was negative for pulmonary embolism.  I had originally planned to obtain a CT AP though patient declined this.   She was treated symptomatically with Zofran  and Toradol  and upon reevaluation she reports that she feels significantly improved.  She has maintained normal vital signs throughout her emergency department stay.  Recommended further workup, however patient declined and prefers to go home.  RN spoke with her legal guardian who is in agreement with this plan.  We discussed tricked return  precautions and the importance of close outpatient follow-up.  Patient understands and agrees to plan.  Discharged in stable condition.  Patient's presentation is most consistent with acute complicated illness / injury requiring diagnostic workup.    FINAL CLINICAL IMPRESSION(S) / ED DIAGNOSES   Final diagnoses:  Viral URI with cough  Nausea vomiting and diarrhea     Rx / DC Orders   ED Discharge Orders     None        Note:  This document was prepared using Dragon voice recognition software and may include unintentional dictation errors.   Donie Lemelin E, PA-C 07/18/24 1704    Willo Dunnings, MD 07/18/24 1754

## 2024-07-18 NOTE — ED Notes (Signed)
 Back from CT scan   Per CT tech she thinks the IV infiltrated  Pt has swelling and tenderness above the site  Iv cath removed

## 2024-07-18 NOTE — ED Notes (Signed)
 Pt now also states sharp pains in her chest that started this morning. New orders placed. Pt states she can't hardly talk. Pt states mid sternal chest pain. Pt states sometimes to left shoulder she will have pain.

## 2024-07-18 NOTE — Discharge Instructions (Signed)
 You did not wish to have your CT of your abdomen and pelvis.  Please follow-up with her outpatient provider.  Please return for any new, worsening, or changing symptoms or other concerns.  It was a pleasure caring for you today.

## 2024-07-18 NOTE — ED Triage Notes (Signed)
 Pt comes with body aches, N/V. Pt states this all started today. Pt stats she started throwing up last night and worse this morning.

## 2024-07-18 NOTE — ED Notes (Signed)
 See triage note  Presents with some abd discomfort with body aches and n/v  Sxs' started this am  Afebrile on arrival

## 2024-07-18 NOTE — Progress Notes (Signed)
 The IV Nurse rec'd a consult for new IV access ; pt needing further CT scan workup; ultrasound was used to assess her left arm; just as this writer was ready to place the IV, the pt stated I don't want this done; I want to go home; I'm hungry;  No attempt made to place the IV; Staff made aware that pt wants to leave.

## 2024-08-08 ENCOUNTER — Encounter: Payer: Self-pay | Admitting: Emergency Medicine

## 2024-08-08 ENCOUNTER — Other Ambulatory Visit: Payer: Self-pay

## 2024-08-08 ENCOUNTER — Emergency Department: Admission: EM | Admit: 2024-08-08 | Discharge: 2024-08-08 | Disposition: A | Discharge status: Home / Self Care

## 2024-08-08 ENCOUNTER — Emergency Department

## 2024-08-08 DIAGNOSIS — Y9301 Activity, walking, marching and hiking: Secondary | ICD-10-CM | POA: Diagnosis not present

## 2024-08-08 DIAGNOSIS — X501XXA Overexertion from prolonged static or awkward postures, initial encounter: Secondary | ICD-10-CM | POA: Insufficient documentation

## 2024-08-08 DIAGNOSIS — S93401A Sprain of unspecified ligament of right ankle, initial encounter: Secondary | ICD-10-CM | POA: Diagnosis not present

## 2024-08-08 DIAGNOSIS — S99911A Unspecified injury of right ankle, initial encounter: Secondary | ICD-10-CM | POA: Diagnosis present

## 2024-08-08 MED ORDER — OXYCODONE-ACETAMINOPHEN 5-325 MG PO TABS
1.0000 | ORAL_TABLET | Freq: Once | ORAL | Status: AC
  Administered 2024-08-08: 1 via ORAL
  Filled 2024-08-08: qty 1

## 2024-08-08 NOTE — ED Notes (Signed)
 Pt discharged to home, discharge instructions, crutch use and pain control reviewed.  Pt verbalized understanding, voices no questions at this time.

## 2024-08-08 NOTE — ED Provider Notes (Signed)
 [facility] Provider Note    Event Date/Time   First MD Initiated Contact with Patient 08/08/24 2225     (approximate)   History   Ankle Pain   HPI  [name] is a 33 y.o. female who presents to the emergency department with 2 hours of right ankle pain.  Patient states that she was in her normal state of health and stepped off onto uneven ground and inverted her ankle.  She reports pain with weightbearing since.  Denies any decrease sensation.  Denies the possibility of pregnancy.  This is her only issue today      Physical Exam   Triage Vital Signs: ED Triage Vitals  Encounter Vitals Group     BP 08/08/24 2153 (!) 156/115     Girls Systolic BP Percentile --      Girls Diastolic BP Percentile --      Boys Systolic BP Percentile --      Boys Diastolic BP Percentile --      Pulse Rate 08/08/24 2153 87     Resp 08/08/24 2153 18     Temp 08/08/24 2153 98.2 F (36.8 C)     Temp Source 08/08/24 2153 Oral     SpO2 08/08/24 2153 100 %     Weight --      Height --      Head Circumference --      Peak Flow --      Pain Score 08/08/24 2151 10     Pain Loc --      Pain Education --      Exclude from Growth Chart --     Most recent vital signs: Vitals:   08/08/24 2153  BP: (!) 156/115  Pulse: 87  Resp: 18  Temp: 98.2 F (36.8 C)  SpO2: 100%    Nursing Triage Note reviewed. Vital signs reviewed and patients oxygen saturation is normoxic  General: Patient is well nourished, well developed, awake and alert, resting comfortably in no acute distress Head: Normocephalic and atraumatic Eyes: Normal inspection, extraocular muscles intact, no conjunctival pallor Ear, nose, throat: Normal external exam Neck: Normal range of motion Respiratory: Patient is in no respiratory distress, lungs CTAB Cardiovascular: Patient is not tachycardic, RRR without murmur appreciated GI: Abd SNT with no guarding or rebound  Back: Normal inspection of the back  with good strength and range of motion throughout all ext Extremities: pulses intact with good cap refills, no LE pitting edema or calf tenderness No visible deformity of the right lower extremity, 2+ DP PT pulses.  Patient is mildly tender over the right medial malleolus but has full passive range of motion (active range of motion causes pain). Neuro: The patient is alert and oriented to person, place, and time, appropriately conversive, with 5/5 bilat UE/LE strength, no gross motor or sensory defects noted. Coordination appears to be adequate. Skin: Warm, dry, and intact Psych: normal mood and affect, no SI or HI  ED Results / Procedures / Treatments   Labs (all labs ordered are listed, but only abnormal results are displayed) Labs Reviewed - No data to display   EKG None  RADIOLOGY Xray right ankle: No acute abnormality on my independent review interpretation radiologist agrees    PROCEDURES:  Critical Care performed: No  Procedures   MEDICATIONS ORDERED IN ED: Medications  oxyCODONE -acetaminophen  (PERCOCET/ROXICET) 5-325 MG per tablet 1 tablet (1 tablet Oral Given 08/08/24 2242)     IMPRESSION / MDM / ASSESSMENT  AND PLAN / ED COURSE                                Differential diagnosis includes, but is not limited to, ankle strain, sprain, fracture  ED course: Patient presents and she is neurovascularly intact in her right lower extremity.  Patient denies the possibility of pregnancy and she was given a Percocet for pain control (she has a ride home).  X-ray demonstrated no acute fracture.  Patient advised that very small nondisplaced fractures may take up to 10 days to appear on x-ray.  She was provided crutches and demonstrated use around the unit.  She feels comfortable returning home and I provided a work note.  She will return with any acutely worsening symptoms   Clinical Course as of 08/08/24 2340  Sun Aug 08, 2024  2254 DG Ankle Complete Right No acute  abnormality.  Patient given crutches and demonstrated use around the unit. [HD]  2257 Temp: 98.2 F (36.8 C) Much improved [HD]    Clinical Course User Index [HD] Nicholaus Rolland BRAVO, MD   At time of discharge there is no evidence of acute life, limb, vision, or fertility threat. Patient has stable vital signs, pain is well controlled, patient is ambulatory with crutches and p.o. tolerant.  Discharge instructions were completed using the EPIC system. I would refer you to those at this time. All warnings prescriptions follow-up etc. were discussed in detail with the patient. Patient indicates understanding and is agreeable with this plan. All questions answered.  Patient is made aware that they may return to the emergency department for any worsening or new condition or for any other emergency.  -- Risk: 5 This patient has a high risk of morbidity due to further diagnostic testing or treatment. Rationale: This patient's evaluation and management involve a high risk of morbidity due to the potential severity of presenting symptoms, need for diagnostic testing, and/or initiation of treatment that may require close monitoring. The differential includes conditions with potential for significant deterioration or requiring escalation of care. Treatment decisions in the ED, including medication administration, procedural interventions, or disposition planning, reflect this level of risk. COPA: 5 The patient has the following acute or chronic illness/injury that poses a possible threat to life or bodily function: [X] : The patient has a potentially serious acute condition or an acute exacerbation of a chronic illness requiring urgent evaluation and management in the Emergency Department. The clinical presentation necessitates immediate consideration of life-threatening or function-threatening diagnoses, even if they are ultimately ruled out.   FINAL CLINICAL IMPRESSION(S) / ED DIAGNOSES   Final diagnoses:   Sprain of right ankle, unspecified ligament, initial encounter     Rx / DC Orders   ED Discharge Orders     None        Note:  This document was prepared using Dragon voice recognition software and may include unintentional dictation errors.   Nicholaus Rolland BRAVO, MD 08/08/24 [phone]

## 2024-08-08 NOTE — Discharge Instructions (Signed)

## 2024-08-08 NOTE — ED Triage Notes (Signed)
 Pt arrives POV via WC c/o right ankle pain. Pt reports was walking and stepped off onto uneven ground and rolled ankle, unable to bear weight since injuring it.
